# Patient Record
Sex: Female | Born: 1975
Health system: Southern US, Community
[De-identification: ages and names within clinical notes are randomized; demographics above are authoritative.]

## PROBLEM LIST (undated history)

## (undated) DIAGNOSIS — D649 Anemia, unspecified: Secondary | ICD-10-CM

## (undated) DIAGNOSIS — Z9889 Other specified postprocedural states: Secondary | ICD-10-CM

## (undated) DIAGNOSIS — R7303 Prediabetes: Secondary | ICD-10-CM

## (undated) DIAGNOSIS — R112 Nausea with vomiting, unspecified: Secondary | ICD-10-CM

## (undated) DIAGNOSIS — E282 Polycystic ovarian syndrome: Secondary | ICD-10-CM

## (undated) DIAGNOSIS — E079 Disorder of thyroid, unspecified: Secondary | ICD-10-CM

## (undated) DIAGNOSIS — K219 Gastro-esophageal reflux disease without esophagitis: Secondary | ICD-10-CM

## (undated) DIAGNOSIS — E785 Hyperlipidemia, unspecified: Secondary | ICD-10-CM

## (undated) DIAGNOSIS — T7840XA Allergy, unspecified, initial encounter: Secondary | ICD-10-CM

## (undated) HISTORY — DX: Disorder of thyroid, unspecified: E07.9

## (undated) HISTORY — PX: GASTRIC BANDING PORT REVISION: SHX5246

## (undated) HISTORY — DX: Allergy, unspecified, initial encounter: T78.40XA

## (undated) HISTORY — DX: Anemia, unspecified: D64.9

## (undated) HISTORY — DX: Hyperlipidemia, unspecified: E78.5

## (undated) HISTORY — PX: LAPAROSCOPIC GASTRIC SLEEVE RESECTION: SHX5895

## (undated) HISTORY — DX: Polycystic ovarian syndrome: E28.2

## (undated) HISTORY — DX: Morbid (severe) obesity due to excess calories: E66.01

## (undated) HISTORY — DX: Prediabetes: R73.03

## (undated) HISTORY — PX: GASTRIC RESTRICTION SURGERY: SHX653

---

## 2000-01-09 ENCOUNTER — Other Ambulatory Visit: Admission: RE | Admit: 2000-01-09 | Discharge: 2000-01-09 | Payer: Self-pay | Admitting: *Deleted

## 2000-02-16 ENCOUNTER — Emergency Department (HOSPITAL_COMMUNITY): Admission: EM | Admit: 2000-02-16 | Discharge: 2000-02-16 | Payer: Self-pay | Admitting: Emergency Medicine

## 2000-02-17 ENCOUNTER — Encounter: Admission: RE | Admit: 2000-02-17 | Discharge: 2000-02-17 | Payer: Self-pay | Admitting: Family Medicine

## 2000-02-17 ENCOUNTER — Encounter: Payer: Self-pay | Admitting: Family Medicine

## 2000-11-30 ENCOUNTER — Other Ambulatory Visit: Admission: RE | Admit: 2000-11-30 | Discharge: 2000-11-30 | Payer: Self-pay | Admitting: Obstetrics and Gynecology

## 2000-12-22 ENCOUNTER — Encounter: Payer: Self-pay | Admitting: Obstetrics and Gynecology

## 2000-12-22 ENCOUNTER — Ambulatory Visit (HOSPITAL_COMMUNITY): Admission: RE | Admit: 2000-12-22 | Discharge: 2000-12-22 | Payer: Self-pay | Admitting: Obstetrics and Gynecology

## 2001-01-19 ENCOUNTER — Ambulatory Visit (HOSPITAL_COMMUNITY): Admission: RE | Admit: 2001-01-19 | Discharge: 2001-01-19 | Payer: Self-pay | Admitting: Obstetrics and Gynecology

## 2001-01-19 ENCOUNTER — Encounter: Payer: Self-pay | Admitting: Obstetrics and Gynecology

## 2001-03-16 ENCOUNTER — Encounter: Admission: RE | Admit: 2001-03-16 | Discharge: 2001-03-16 | Payer: Self-pay | Admitting: Obstetrics and Gynecology

## 2001-03-19 ENCOUNTER — Inpatient Hospital Stay (HOSPITAL_COMMUNITY): Admission: AD | Admit: 2001-03-19 | Discharge: 2001-03-19 | Payer: Self-pay | Admitting: Obstetrics and Gynecology

## 2001-04-13 ENCOUNTER — Encounter (HOSPITAL_COMMUNITY): Admission: RE | Admit: 2001-04-13 | Discharge: 2001-04-20 | Payer: Self-pay | Admitting: Obstetrics and Gynecology

## 2001-04-16 ENCOUNTER — Encounter: Payer: Self-pay | Admitting: Obstetrics and Gynecology

## 2001-04-19 ENCOUNTER — Inpatient Hospital Stay (HOSPITAL_COMMUNITY): Admission: AD | Admit: 2001-04-19 | Discharge: 2001-04-19 | Payer: Self-pay | Admitting: Obstetrics and Gynecology

## 2001-04-23 ENCOUNTER — Inpatient Hospital Stay (HOSPITAL_COMMUNITY): Admission: AD | Admit: 2001-04-23 | Discharge: 2001-04-23 | Payer: Self-pay | Admitting: Obstetrics and Gynecology

## 2001-04-27 ENCOUNTER — Encounter: Payer: Self-pay | Admitting: Obstetrics and Gynecology

## 2001-04-27 ENCOUNTER — Encounter (HOSPITAL_COMMUNITY): Admission: RE | Admit: 2001-04-27 | Discharge: 2001-05-10 | Payer: Self-pay | Admitting: Obstetrics and Gynecology

## 2001-05-07 ENCOUNTER — Encounter (INDEPENDENT_AMBULATORY_CARE_PROVIDER_SITE_OTHER): Payer: Self-pay

## 2001-05-07 ENCOUNTER — Inpatient Hospital Stay (HOSPITAL_COMMUNITY): Admission: AD | Admit: 2001-05-07 | Discharge: 2001-05-09 | Payer: Self-pay | Admitting: Obstetrics and Gynecology

## 2001-05-10 ENCOUNTER — Encounter: Admission: RE | Admit: 2001-05-10 | Discharge: 2001-06-09 | Payer: Self-pay | Admitting: Obstetrics and Gynecology

## 2001-06-10 ENCOUNTER — Encounter: Admission: RE | Admit: 2001-06-10 | Discharge: 2001-07-10 | Payer: Self-pay | Admitting: Obstetrics and Gynecology

## 2002-04-04 ENCOUNTER — Encounter: Admission: RE | Admit: 2002-04-04 | Discharge: 2002-07-03 | Payer: Self-pay | Admitting: Family Medicine

## 2003-03-08 ENCOUNTER — Emergency Department (HOSPITAL_COMMUNITY): Admission: EM | Admit: 2003-03-08 | Discharge: 2003-03-08 | Payer: Self-pay | Admitting: Emergency Medicine

## 2003-05-19 ENCOUNTER — Encounter: Admission: RE | Admit: 2003-05-19 | Discharge: 2003-05-19 | Payer: Self-pay | Admitting: Surgery

## 2003-05-23 ENCOUNTER — Encounter: Admission: RE | Admit: 2003-05-23 | Discharge: 2003-08-21 | Payer: Self-pay | Admitting: Surgery

## 2003-06-13 ENCOUNTER — Observation Stay (HOSPITAL_COMMUNITY): Admission: RE | Admit: 2003-06-13 | Discharge: 2003-06-14 | Payer: Self-pay | Admitting: Surgery

## 2003-08-23 ENCOUNTER — Encounter: Admission: RE | Admit: 2003-08-23 | Discharge: 2003-08-23 | Payer: Self-pay | Admitting: Surgery

## 2003-08-31 ENCOUNTER — Encounter: Admission: RE | Admit: 2003-08-31 | Discharge: 2003-08-31 | Payer: Self-pay | Admitting: Surgery

## 2003-10-13 ENCOUNTER — Ambulatory Visit (HOSPITAL_COMMUNITY): Admission: RE | Admit: 2003-10-13 | Discharge: 2003-10-13 | Payer: Self-pay | Admitting: Surgery

## 2003-11-06 ENCOUNTER — Ambulatory Visit (HOSPITAL_COMMUNITY): Admission: RE | Admit: 2003-11-06 | Discharge: 2003-11-06 | Payer: Self-pay | Admitting: Surgery

## 2004-03-12 ENCOUNTER — Encounter: Admission: RE | Admit: 2004-03-12 | Discharge: 2004-03-12 | Payer: Self-pay | Admitting: Surgery

## 2004-08-06 ENCOUNTER — Ambulatory Visit: Payer: Self-pay | Admitting: Family Medicine

## 2004-08-20 ENCOUNTER — Other Ambulatory Visit: Admission: RE | Admit: 2004-08-20 | Discharge: 2004-08-20 | Payer: Self-pay | Admitting: Family Medicine

## 2004-08-20 ENCOUNTER — Ambulatory Visit: Payer: Self-pay | Admitting: Family Medicine

## 2005-01-09 ENCOUNTER — Ambulatory Visit: Payer: Self-pay | Admitting: Family Medicine

## 2005-11-10 ENCOUNTER — Ambulatory Visit: Payer: Self-pay | Admitting: Family Medicine

## 2005-11-14 ENCOUNTER — Ambulatory Visit: Payer: Self-pay | Admitting: Family Medicine

## 2005-11-26 ENCOUNTER — Ambulatory Visit: Payer: Self-pay | Admitting: Family Medicine

## 2006-04-19 ENCOUNTER — Emergency Department (HOSPITAL_COMMUNITY): Admission: EM | Admit: 2006-04-19 | Discharge: 2006-04-19 | Payer: Self-pay | Admitting: Family Medicine

## 2006-07-20 ENCOUNTER — Ambulatory Visit: Payer: Self-pay | Admitting: Family Medicine

## 2006-12-02 DIAGNOSIS — E039 Hypothyroidism, unspecified: Secondary | ICD-10-CM | POA: Insufficient documentation

## 2006-12-02 DIAGNOSIS — I10 Essential (primary) hypertension: Secondary | ICD-10-CM | POA: Insufficient documentation

## 2007-02-17 ENCOUNTER — Telehealth: Payer: Self-pay | Admitting: Family Medicine

## 2007-06-30 ENCOUNTER — Telehealth: Payer: Self-pay | Admitting: Family Medicine

## 2007-10-06 ENCOUNTER — Encounter: Admission: RE | Admit: 2007-10-06 | Discharge: 2007-10-06 | Payer: Self-pay | Admitting: Surgery

## 2007-10-19 ENCOUNTER — Encounter: Admission: RE | Admit: 2007-10-19 | Discharge: 2007-10-19 | Payer: Self-pay | Admitting: Obstetrics and Gynecology

## 2007-11-19 ENCOUNTER — Inpatient Hospital Stay (HOSPITAL_COMMUNITY): Admission: AD | Admit: 2007-11-19 | Discharge: 2007-11-19 | Payer: Self-pay | Admitting: Obstetrics and Gynecology

## 2007-12-22 ENCOUNTER — Inpatient Hospital Stay (HOSPITAL_COMMUNITY): Admission: AD | Admit: 2007-12-22 | Discharge: 2007-12-24 | Payer: Self-pay | Admitting: Obstetrics and Gynecology

## 2008-01-26 ENCOUNTER — Ambulatory Visit: Payer: Self-pay | Admitting: Family Medicine

## 2008-02-17 ENCOUNTER — Telehealth: Payer: Self-pay | Admitting: Family Medicine

## 2008-09-15 ENCOUNTER — Telehealth: Payer: Self-pay | Admitting: Family Medicine

## 2009-11-08 ENCOUNTER — Telehealth: Payer: Self-pay | Admitting: Family Medicine

## 2009-11-09 ENCOUNTER — Ambulatory Visit: Payer: Self-pay | Admitting: Family Medicine

## 2009-11-09 DIAGNOSIS — IMO0002 Reserved for concepts with insufficient information to code with codable children: Secondary | ICD-10-CM | POA: Insufficient documentation

## 2009-12-04 ENCOUNTER — Encounter (INDEPENDENT_AMBULATORY_CARE_PROVIDER_SITE_OTHER): Payer: Self-pay | Admitting: *Deleted

## 2009-12-12 ENCOUNTER — Encounter (INDEPENDENT_AMBULATORY_CARE_PROVIDER_SITE_OTHER): Payer: Self-pay | Admitting: *Deleted

## 2009-12-18 ENCOUNTER — Ambulatory Visit: Payer: Self-pay | Admitting: Internal Medicine

## 2009-12-26 ENCOUNTER — Ambulatory Visit: Payer: Self-pay | Admitting: Family Medicine

## 2009-12-26 LAB — CONVERTED CEMR LAB
Nitrite: NEGATIVE
Protein, U semiquant: NEGATIVE

## 2009-12-28 LAB — CONVERTED CEMR LAB
AST: 15 units/L (ref 0–37)
Albumin: 4.1 g/dL (ref 3.5–5.2)
Alkaline Phosphatase: 58 units/L (ref 39–117)
Basophils Absolute: 0.1 10*3/uL (ref 0.0–0.1)
Basophils Relative: 1 % (ref 0–1)
Calcium: 9.5 mg/dL (ref 8.4–10.5)
Creatinine, Ser: 0.79 mg/dL (ref 0.40–1.20)
Eosinophils Relative: 3 % (ref 0–5)
HCT: 37.2 % (ref 36.0–46.0)
HDL: 39 mg/dL — ABNORMAL LOW (ref >39)
Hemoglobin: 11.7 g/dL — ABNORMAL LOW (ref 12.0–15.0)
Indirect Bilirubin: 0.5 mg/dL (ref 0.0–0.9)
MCHC: 31.5 g/dL (ref 30.0–36.0)
MCV: 80.9 fL (ref 78.0–100.0)
Monocytes Absolute: 0.6 10*3/uL (ref 0.1–1.0)
RDW: 14.4 % (ref 11.5–15.5)
Total Bilirubin: 0.6 mg/dL (ref 0.3–1.2)
Total Protein: 7.1 g/dL (ref 6.0–8.3)
Triglycerides: 47 mg/dL (ref <150)

## 2010-01-01 ENCOUNTER — Ambulatory Visit: Payer: Self-pay | Admitting: Internal Medicine

## 2010-01-01 HISTORY — PX: COLONOSCOPY: SHX174

## 2010-01-02 ENCOUNTER — Ambulatory Visit: Payer: Self-pay | Admitting: Family Medicine

## 2010-01-08 ENCOUNTER — Encounter: Admission: RE | Admit: 2010-01-08 | Discharge: 2010-01-08 | Payer: Self-pay | Admitting: Family Medicine

## 2010-03-09 ENCOUNTER — Emergency Department (HOSPITAL_COMMUNITY): Admission: EM | Admit: 2010-03-09 | Discharge: 2010-03-09 | Payer: Self-pay | Admitting: Emergency Medicine

## 2010-04-23 ENCOUNTER — Telehealth: Payer: Self-pay | Admitting: Family Medicine

## 2010-04-30 ENCOUNTER — Ambulatory Visit
Admission: RE | Admit: 2010-04-30 | Discharge: 2010-04-30 | Payer: Self-pay | Source: Home / Self Care | Attending: Family Medicine | Admitting: Family Medicine

## 2010-04-30 DIAGNOSIS — M545 Low back pain, unspecified: Secondary | ICD-10-CM | POA: Insufficient documentation

## 2010-05-11 ENCOUNTER — Encounter: Payer: Self-pay | Admitting: Surgery

## 2010-05-21 NOTE — Letter (Signed)
Summary: [facility] Instructions  [facility]  [address] [place], [place] [address]   Phone: [phone]  Fax: [phone]       Lisa Horne    Sep 14, 1975    MRN: [id number]        Procedure Day Lisa Horne:  Lisa Horne  01/01/10     Arrival Time:  1:00PM     Procedure Time:  2:00PM     Location of Procedure:                    _ X_  [facility] (4th Floor)                      PREPARATION FOR COLONOSCOPY WITH MOVIPREP   Starting 5 days prior to your procedure 12/27/09 do not eat nuts, seeds, popcorn, corn, beans, peas,  salads, or any raw vegetables.  Do not take any fiber supplements (e.g. Metamucil, Citrucel, and Benefiber).  THE DAY BEFORE YOUR PROCEDURE         DATE: 12/31/09   DAY: MONDAY  1.  Drink clear liquids the entire day-NO SOLID FOOD  2.  Do not drink anything colored red or purple.  Avoid juices with pulp.  No orange juice.  3.  Drink at least 64 oz. (8 glasses) of fluid/clear liquids during the day to prevent dehydration and help the prep work efficiently.  CLEAR LIQUIDS INCLUDE: Water Jello Ice Popsicles Tea (sugar ok, no milk/cream) Powdered fruit flavored drinks Coffee (sugar ok, no milk/cream) Gatorade Juice: apple, white grape, white cranberry  Lemonade Clear bullion, consomm, broth Carbonated beverages (any kind) Strained chicken noodle soup Hard Candy                             4.  In the morning, mix first dose of MoviPrep solution:    Empty 1 Pouch A and 1 Pouch B into the disposable container    Add lukewarm drinking water to the top line of the container. Mix to dissolve    Refrigerate (mixed solution should be used within 24 hrs)  5.  Begin drinking the prep at 5:00 p.m. The MoviPrep container is divided by 4 marks.   Every 15 minutes drink the solution down to the next mark (approximately 8 oz) until the full liter is complete.   6.  Follow completed prep with 16 oz of clear liquid of your choice (Nothing  red or purple).  Continue to drink clear liquids until bedtime.  7.  Before going to bed, mix second dose of MoviPrep solution:    Empty 1 Pouch A and 1 Pouch B into the disposable container    Add lukewarm drinking water to the top line of the container. Mix to dissolve    Refrigerate  THE DAY OF YOUR PROCEDURE      DATE: 01/01/10  DAY: TUESDAY  Beginning at 9:00AM (5 hours before procedure):         1. Every 15 minutes, drink the solution down to the next mark (approx 8 oz) until the full liter is complete.  2. Follow completed prep with 16 oz. of clear liquid of your choice.    3. You may drink clear liquids until 12:00PM (2 HOURS BEFORE PROCEDURE).   MEDICATION INSTRUCTIONS  Unless otherwise instructed, you should take regular prescription medications with a small sip of water   as early as possible the morning of  your procedure.        OTHER INSTRUCTIONS  You will need a responsible adult at least 37 years of age to accompany you and drive you home.   This person must remain in the waiting room during your procedure.  Wear loose fitting clothing that is easily removed.  Leave jewelry and other valuables at home.  However, you may wish to bring a book to read or  an iPod/MP3 player to listen to music as you wait for your procedure to start.  Remove all body piercing jewelry and leave at home.  Total time from sign-in until discharge is approximately 2-3 hours.  You should go home directly after your procedure and rest.  You can resume normal activities the  day after your procedure.  The day of your procedure you should not:   Drive   Make legal decisions   Operate machinery   Drink alcohol   Return to work  You will receive specific instructions about eating, activities and medications before you leave.    The above instructions have been reviewed and explained to me by   Wyona Almas RN  December 18, 2009 1:43 PM     I fully understand and can  verbalize these instructions _____________________________ Date _________

## 2010-05-21 NOTE — Assessment & Plan Note (Signed)
Summary: sharp shooting pain in upper thigh rt leg/cjr   Vital Signs:  Patient profile:   35 year old female Weight:      242 pounds BP sitting:   108 / 76  (left arm) Cuff size:   regular  Vitals Entered By: Raechel Ache, RN (November 09, 2009 1:53 PM) CC: C/o pain front of R thigh x 2 weeks after fall.   History of Present Illness: About 2 weeks ago while [place] she slipped on some rocks and lost her balance. She was able to catch herself before she fell, but she has had pain in the right hip ever since. It hurts in the anterior right groin and the anterior upper thigh when she walks on it or tries to sit coss legged. No pain in the back. Heat and ice do not help. She has not taken any meds for it.   Allergies: 1)  ! Cipro 2)  ! Dyazide  Past History:  Past Medical History: Reviewed history from 12/02/2006 and no changes required. Obesity Hemorrhoids Hyperglycemia Hypertension Hypothyroidism  Review of Systems  The patient denies anorexia, fever, weight loss, weight gain, vision loss, decreased hearing, hoarseness, chest pain, syncope, dyspnea on exertion, peripheral edema, prolonged cough, headaches, hemoptysis, abdominal pain, melena, hematochezia, severe indigestion/heartburn, hematuria, incontinence, genital sores, muscle weakness, suspicious skin lesions, transient blindness, difficulty walking, depression, unusual weight change, abnormal bleeding, enlarged lymph nodes, angioedema, breast masses, and testicular masses.    Physical Exam  General:  walks with a slight limp Msk:  No deformity or scoliosis noted of thoracic or lumbar spine.  She is not tender anywhere around the hip or groin or thigh, but extreme flexion or internal/external rotation are very painful for her    Impression & Recommendations:  Problem # 1:  SPRAIN&STRAIN OF UNSPECIFIED SITE OF HIP&THIGH (ICD-843.9)  Orders: T-Hip Comp Right Min 2 views (73510TC)  Patient  Instructions: 1)  Rest, will try Motrin for pain. Set up hip Xrays soon

## 2010-05-21 NOTE — Letter (Signed)
Summary: Pre Visit Letter Revised  [facility]  [address] [place], [place] [address]   Phone: [phone]  Fax: [phone]    12/04/2009 MRN: [id number]  Lisa Horne [address] [place], [place]  [address]              Procedure Date:  01-01-10    Welcome to the Gastroenterology Division at [facility].    You are scheduled to see a nurse for your pre-procedure visit on 12-18-09 at 1:30p.m. on the 3rd floor at [facility], [address]  We ask that you try to arrive at our office 15 minutes prior to your appointment time to allow for check-in.  Please take a minute to review the attached form.  If you answer "Yes" to one or more of the questions on the first page, we ask that you call the person listed at your earliest opportunity.  If you answer "No" to all of the questions, please complete the rest of the form and bring it to your appointment.    Your nurse visit will consist of discussing your medical and surgical history, your immediate family medical history, and your medications.    If you are unable to list all of your medications on the form, please bring the medication bottles to your appointment and we will list them.  We will need to be aware of both prescribed and over the counter drugs.  We will need to know exact dosage information as well.    Please be prepared to read and sign documents such as consent forms, a financial agreement, and acknowledgement forms.  If necessary, and with your consent, a friend or relative is welcome to sit-in on the nurse visit with you.  Please bring your insurance card so that we may make a copy of it.  If your insurance requires a referral to see a specialist, please bring your referral form from your primary care physician.  No co-pay is required for this nurse visit.     If you cannot keep your appointment, please call [phone] to cancel or reschedule prior to your appointment date.  This  allows [place] the opportunity to schedule an appointment for another patient in need of care.   Thank you for choosing [facility] for your medical needs.  We appreciate the opportunity to care for you.  Please visit [place] at our website  to learn more about our practice.                     Sincerely,  The Gastroenterology Division

## 2010-05-21 NOTE — Procedures (Signed)
Summary: Colonoscopy  Patient: Lisa Horne Note: All result statuses are Final unless otherwise noted.  Tests: (1) Colonoscopy (COL)   COL Colonoscopy           DONE     [facility]     [address]     [place], [place]  [address]           COLONOSCOPY PROCEDURE REPORT           PATIENT:  Lisa, Horne  MR#:  [id number]     BIRTHDATE:  1976-03-10, 35 yrs. old  GENDER:  female     ENDOSCOPIST:  Iva Boop, MD, [facility]           PROCEDURE DATE:  01/01/2010     PROCEDURE:  Colonoscopy [address]     ASA CLASS:  Class I     INDICATIONS:  Elevated Risk Screening, family history of colon     cancer sister at age 28 and  a maternal aunt     MEDICATIONS:   Fentanyl 50 mcg IV, Versed 7 mg IV           DESCRIPTION OF PROCEDURE:   After the risks benefits and     alternatives of the procedure were thoroughly explained, informed     consent was obtained.  Digital rectal exam was performed and     revealed no abnormalities.   The [id number] endoscope was     introduced through the anus and advanced to the cecum, which was     identified by both the appendix and ileocecal valve, without     limitations.  The quality of the prep was excellent, using     MoviPrep.  The instrument was then slowly withdrawn as the colon     was fully examined. Insertion: 2:29 minutes Withdrawal: 6:31     minutes     <<PROCEDUREIMAGES>>           FINDINGS:  A normal appearing cecum, ileocecal valve, and     appendiceal orifice were identified. The ascending, hepatic     flexure, transverse, splenic flexure, descending, sigmoid colon,     and rectum appeared unremarkable.   Retroflexed views in the     rectum revealed internal and external hemorrhoids.    The scope     was then withdrawn from the patient and the procedure completed.           COMPLICATIONS:  None     ENDOSCOPIC IMPRESSION:     1) Normal colon, excellent prep     2) Internal and external hemorrhoids           REPEAT EXAM:   In 5 year(s) for routine screening colonoscopy.           Iva Boop, MD, [facility]           CC:  Nelwyn Salisbury, MD     The Patient           n.     Rosalie Doctor:   Iva Boop at 01/01/2010 02:27 PM           Lisa Horne, [id number]  Note: An exclamation mark (!) indicates a result that was not dispersed into the flowsheet. Document Creation Date: 01/01/2010 2:28 PM _______________________________________________________________________  (1) Order result status: Final Collection or observation date-time: 01/01/2010 14:16 Requested date-time:  Receipt date-time:  Reported date-time:  Referring Physician:   Ordering Physician: Stan Head [phone]) Specimen Source:  Source:  Launa Grill Order Number: [id number] Lab site:   Appended Document: Colonoscopy    Clinical Lists Changes  Observations: Added new observation of COLONNXTDUE: 12/2014 (01/01/2010 14:53)

## 2010-05-21 NOTE — Miscellaneous (Signed)
Summary: [facility] Previsit/prep  Clinical Lists Changes  Medications: Added new medication of MOVIPREP 100 GM  SOLR (PEG-KCL-NACL-NASULF-NA ASC-C) As per prep instructions. - Signed Rx of MOVIPREP 100 GM  SOLR (PEG-KCL-NACL-NASULF-NA ASC-C) As per prep instructions.;  #1 x 0;  Signed;  Entered by: Wyona Almas RN;  Authorized by: Iva Boop MD, [facility];  Method used: Electronically to [facility]*, [address], [place], [place]  [id number], Ph: [phone], Fax: [phone] Allergies: Changed allergy or adverse reaction from CIPRO to CIPRO Changed allergy or adverse reaction from DYAZIDE to [facility]    Prescriptions: MOVIPREP 100 GM  SOLR (PEG-KCL-NACL-NASULF-NA ASC-C) As per prep instructions.  #1 x 0   Entered by:   Wyona Almas RN   Authorized by:   Iva Boop MD, [facility]   Signed by:   Wyona Almas RN on 12/18/2009   Method used:   Electronically to        [facility]* (retail)       [address]       [place], [place]  [id number]       Ph: [phone]       Fax: [phone]   RxID:   [id number]

## 2010-05-21 NOTE — Assessment & Plan Note (Signed)
Summary: CPX/NO PAP/NJR   Vital Signs:  Patient profile:   35 year old female Menstrual status:  regular LMP:     11/19/2009 Height:      68 inches Weight:      238 pounds BMI:     36.32 O2 Sat:      98 % Temp:     98.5 degrees F Pulse rate:   94 / minute BP sitting:   120 / 82  (left arm) Cuff size:   regular  Vitals Entered By: Pura Spice, RN (January 02, 2010 10:01 AM) CC: cpx with pap  discuss labs had colonscopy yest.  Is Patient Diabetic? No LMP (date): 11/19/2009     Menstrual Status regular Enter LMP: 11/19/2009    History of Present Illness: 35 yr old female for a cpx. She feels fine in general. Still running and training for a half marathon she plans to participate in next month.   Allergies: 1)  ! Cipro 2)  ! Dyazide  Past History:  Past Medical History: Obesity Hemorrhoids Hyperglycemia Hypertension Hypothyroidism sees Dr. Jarold Song for GYN exams  Past Surgical History: Gastric banding 2005 colonoscopy 01-01-10 per Dr. Leone Payor, internal and external hemorrhoids only, repeat in 5 yrs  Family History: Reviewed history and no changes required. unremarkable  Social History: Reviewed history from 12/02/2006 and no changes required. Married Never Smoked Regular exercise-no Alcohol use-no  Review of Systems  The patient denies anorexia, fever, weight loss, weight gain, vision loss, decreased hearing, hoarseness, chest pain, syncope, dyspnea on exertion, peripheral edema, prolonged cough, headaches, hemoptysis, abdominal pain, melena, hematochezia, severe indigestion/heartburn, hematuria, incontinence, genital sores, muscle weakness, suspicious skin lesions, transient blindness, difficulty walking, depression, unusual weight change, abnormal bleeding, enlarged lymph nodes, angioedema, breast masses, and testicular masses.         Flu Vaccine Consent Questions     Do you have a history of severe allergic reactions to this vaccine? no    Any  prior history of allergic reactions to egg and/or gelatin? no    Do you have a sensitivity to the preservative Thimersol? no    Do you have a past history of Guillan-Barre Syndrome? no    Do you currently have an acute febrile illness? no    Have you ever had a severe reaction to latex? no    Vaccine information given and explained to patient? yes    Are you currently pregnant? no    Lot Number:[id number]   Exp Date:10/19/2010   Manufacturer: [employer]    Site Given  Left Deltoid IM Pura Spice, RN  January 02, 2010 11:26 AM   Physical Exam  General:  overweight-appearing.   Head:  Normocephalic and atraumatic without obvious abnormalities. No apparent alopecia or balding. Eyes:  No corneal or conjunctival inflammation noted. EOMI. Perrla. Funduscopic exam benign, without hemorrhages, exudates or papilledema. Vision grossly normal. Ears:  External ear exam shows no significant lesions or deformities.  Otoscopic examination reveals clear canals, tympanic membranes are intact bilaterally without bulging, retraction, inflammation or discharge. Hearing is grossly normal bilaterally. Nose:  External nasal examination shows no deformity or inflammation. Nasal mucosa are pink and moist without lesions or exudates. Mouth:  Oral mucosa and oropharynx without lesions or exudates.  Teeth in good repair. Neck:  No deformities, masses, or tenderness noted. Chest Wall:  No deformities, masses, or tenderness noted. Lungs:  Normal respiratory effort, chest expands symmetrically. Lungs are clear to auscultation, no crackles or wheezes. Heart:  Normal  rate and regular rhythm. S1 and S2 normal without gallop, murmur, click, rub or other extra sounds. Abdomen:  Bowel sounds positive,abdomen soft and non-tender without masses, organomegaly or hernias noted. Msk:  No deformity or scoliosis noted of thoracic or lumbar spine.   Pulses:  R and L carotid,radial,femoral,dorsalis pedis and posterior tibial pulses  are full and equal bilaterally Extremities:  No clubbing, cyanosis, edema, or deformity noted with normal full range of motion of all joints.   Neurologic:  No cranial nerve deficits noted. Station and gait are normal. Plantar reflexes are down-going bilaterally. DTRs are symmetrical throughout. Sensory, motor and coordinative functions appear intact. Skin:  Intact without suspicious lesions or rashes Cervical Nodes:  No lymphadenopathy noted Axillary Nodes:  No palpable lymphadenopathy Inguinal Nodes:  No significant adenopathy Psych:  Cognition and judgment appear intact. Alert and cooperative with normal attention span and concentration. No apparent delusions, illusions, hallucinations   Impression & Recommendations:  Problem # 1:  HEALTH MAINTENANCE EXAM (ICD-V70.0)  Complete Medication List: 1)  Merina  2)  Synthroid 50 Mcg Tabs (Levothyroxine sodium) .... Once daily  Other Orders: Flu Vaccine 74yrs + MEDICARE PATIENTS (Q2595) Administration Flu vaccine - MCR (G3875)  Patient Instructions: 1)  Start on Synthroid and recheck a TSH in 90 days Prescriptions: SYNTHROID 50 MCG TABS (LEVOTHYROXINE SODIUM) once daily  #30 x 2   Entered and Authorized by:   Nelwyn Salisbury MD   Signed by:   Nelwyn Salisbury MD on 01/02/2010   Method used:   Electronically to        [facility]* (retail)       [address]       [place], [place]  [id number]       Ph: [phone]       Fax: [phone]   RxID:   [id number]

## 2010-05-21 NOTE — Progress Notes (Signed)
Summary: hip pain  Phone Note Call from Patient Call back at [phone]   Caller: Patient Summary of Call: patient is calling because she is having pain with her right hip.  she fell and hit her hip on river rocks.  she is now having sharp pain with movement.  should she come in or a referral needed? Initial call taken by: Kern Reap CMA Duncan Dull),  November 08, 2009 4:10 PM  Follow-up for Phone Call        I need to see her for this Follow-up by: Nelwyn Salisbury MD,  November 09, 2009 10:35 AM  Additional Follow-up for Phone Call Additional follow up Details #1::        appt today

## 2010-05-23 NOTE — Progress Notes (Signed)
Summary: has strep    [facility] 04/23/2010  Phone Note Call from Patient Call back at [phone]   Caller: Patient---live call Summary of Call: May have strep throat. fever---102, sore throat. Initial call taken by: Warnell Forester,  April 23, 2010 8:46 AM  Follow-up for Phone Call        [facility] Follow-up by: [facility] CMA AAMA,  April 23, 2010 1:35 PM  Additional Follow-up for Phone Call Additional follow up Details #1::        Feeling better, but will call back if needs appt. Additional Follow-up by: Lynann Beaver CMA AAMA,  April 24, 2010 1:47 PM

## 2010-05-23 NOTE — Assessment & Plan Note (Signed)
Summary: back injury/dm   Vital Signs:  Patient profile:   35 year old female Menstrual status:  regular O2 Sat:      94 % Temp:     99.4 degrees F Pulse rate:   84 / minute BP sitting:   140 / 80  (left arm) Cuff size:   regular  Vitals Entered By: Pura Spice, RN (April 30, 2010 3:49 PM) CC: lower back pain rt leg painful  ran out of synthroid.   History of Present Illness: Here for Synthroid refills and for back pain. She took Synthroid for 3 months and then ran out, and this was one month ago. Yesterday while unloading groceries she felt a sharp low back pain with some spasms. Advil helps a bit.   Allergies: 1)  ! Cipro 2)  ! Dyazide  Past History:  Past Medical History: Reviewed history from 01/02/2010 and no changes required. Obesity Hemorrhoids Hyperglycemia Hypertension Hypothyroidism sees Dr. Jarold Song for GYN exams  Past Surgical History: Reviewed history from 01/02/2010 and no changes required. Gastric banding 2005 colonoscopy 01-01-10 per Dr. Leone Payor, internal and external hemorrhoids only, repeat in 5 yrs  Review of Systems  The patient denies anorexia, fever, weight loss, weight gain, vision loss, decreased hearing, hoarseness, chest pain, syncope, dyspnea on exertion, peripheral edema, prolonged cough, headaches, hemoptysis, abdominal pain, melena, hematochezia, severe indigestion/heartburn, hematuria, incontinence, genital sores, muscle weakness, suspicious skin lesions, transient blindness, difficulty walking, depression, unusual weight change, abnormal bleeding, enlarged lymph nodes, angioedema, breast masses, and testicular masses.    Physical Exam  General:  Well-developed,well-nourished,in no acute distress; alert,appropriate and cooperative throughout examination Msk:  tende rin the lower back with spasms. ROM is reduced but SLR are negative   Impression & Recommendations:  Problem # 1:  HYPOTHYROIDISM (ICD-244.9)  The following  medications were removed from the medication list:    Synthroid 50 Mcg Tabs (Levothyroxine sodium) ..... Once daily Her updated medication list for this problem includes:    Synthroid 100 Mcg Tabs (Levothyroxine sodium) ..... Once daily  Problem # 2:  BACK PAIN, LUMBAR (ICD-724.2)  Her updated medication list for this problem includes:    Vicodin 5-500 Mg Tabs (Hydrocodone-acetaminophen) .Marland Kitchen... 1 q 6 hours as needed pain    Flexeril 10 Mg Tabs (Cyclobenzaprine hcl) .Marland Kitchen... Three times a day as needed spasm  Complete Medication List: 1)  Merina  2)  Vicodin 5-500 Mg Tabs (Hydrocodone-acetaminophen) .Marland Kitchen.. 1 q 6 hours as needed pain 3)  Flexeril 10 Mg Tabs (Cyclobenzaprine hcl) .... Three times a day as needed spasm 4)  Synthroid 100 Mcg Tabs (Levothyroxine sodium) .... Once daily  Patient Instructions: 1)  Please schedule a follow-up appointment in 3 months .  Prescriptions: SYNTHROID 100 MCG TABS (LEVOTHYROXINE SODIUM) once daily  #90 x 0   Entered and Authorized by:   Nelwyn Salisbury MD   Signed by:   Nelwyn Salisbury MD on 04/30/2010   Method used:   Print then Give to Patient   RxID:   [id number] FLEXERIL 10 MG TABS (CYCLOBENZAPRINE HCL) three times a day as needed spasm  #60 x 5   Entered and Authorized by:   Nelwyn Salisbury MD   Signed by:   Nelwyn Salisbury MD on 04/30/2010   Method used:   Print then Give to Patient   RxID:   [id number] VICODIN 5-500 MG TABS (HYDROCODONE-ACETAMINOPHEN) 1 q 6 hours as needed pain  #60 x 0   Entered and Authorized  by:   Nelwyn Salisbury MD   Signed by:   Nelwyn Salisbury MD on 04/30/2010   Method used:   Print then Give to Patient   RxID:   [id number]    Orders Added: 1)  Est. Patient Level IV [[address]]

## 2010-08-01 ENCOUNTER — Encounter: Payer: BC Managed Care – HMO | Attending: Surgery | Admitting: *Deleted

## 2010-08-01 DIAGNOSIS — Z9884 Bariatric surgery status: Secondary | ICD-10-CM | POA: Insufficient documentation

## 2010-08-01 DIAGNOSIS — Z713 Dietary counseling and surveillance: Secondary | ICD-10-CM | POA: Insufficient documentation

## 2010-08-01 DIAGNOSIS — Z09 Encounter for follow-up examination after completed treatment for conditions other than malignant neoplasm: Secondary | ICD-10-CM | POA: Insufficient documentation

## 2010-09-03 NOTE — Discharge Summary (Signed)
[name], Lisa Horne             ACCOUNT NO.:  [id number]   MEDICAL RECORD NO.:  [id number]          PATIENT TYPE:  INP   LOCATION:  9135                          FACILITY:  [facility]   PHYSICIAN:  Huel Cote, M.D. DATE OF BIRTH:  10/26/1975   DATE OF ADMISSION:  12/22/2007  DATE OF DISCHARGE:  12/24/2007                               DISCHARGE SUMMARY   DISCHARGE DIAGNOSES:  1. Term pregnancy at 40-0/7 weeks, delivered.  2. Precipitous vaginal delivery.   DISCHARGE MEDICATIONS:  1. Motrin 600 mg p.o. every 6 hours.  2. Proctocort suppositories per rectum once daily for 3-4 days.   DISCHARGE FOLLOWUP:  The patient is to follow up in my office in 6 weeks  for a full postpartum exam.   HOSPITAL COURSE:  The patient is a 35 year old G3, P1-0-0-1, who was  admitted at 67 weeks' gestation after presenting to Maternity Admissions  with a cervix noted to be 3 cm dilated at 2:30 a.m.  She was then  admitted to Labor and Delivery at approximately 4 a.m. and received an  epidural.  She was checked shortly thereafter, noted to be 7 cm, and  then had rupture of membranes.  I was paged at 4:53 a.m. to deliver the  patient, and the patient delivered at 4:55 a.m.   Prenatal labs are as follows:  A positive, antibody negative, RPR  nonreactive, rubella immune, hepatitis B surface antigen negative.  The  patient did have a positive ELISA on her HIV screen, however had a PCR  which was negative for viral load.  GC was negative, chlamydia negative,  1-hour Glucola was 140, and 3-hour was normal.  Group B strep was  positive.  She declined a quad screen.   PAST OBSTETRICAL HISTORY:  Significant for a vaginal delivery in 2003 of  a 6-pound 12-ounce infant that had a birth defect of bladder exstrophy.  In 2004, she had a spontaneous abortion, prenatal care being complicated  by the gestational diabetes with good control on glyburide and history  of hypertension with borderline blood pressures at  the last few visits.  The PIH workup is negative.  She was also positive group B strep.   PAST MEDICAL HISTORY:  Hypothyroidism and history of a lap band  procedure.   PAST SURGICAL HISTORY:  lap band procedure.   GYNECOLOGICAL HISTORY:  None.   ALLERGIES:  CIPRO and HYDROCHLOROTHIAZIDE.   MEDICATIONS:  Glyburide b.i.d. and prenatal vitamins.   HOSPITAL COURSE:  Blood pressures were 130s/60s.  As stated, shortly  after reaching complete dilation, the patient precipitously delivered by  the RN.  There was a nuchal cord x1, Apgars were 8 and 9, weight was 6  pounds 8 ounces, of a vigorous female infant.  Placenta delivered  spontaneously.  A first-degree laceration was noted by me and was  repaired with 2-0 Vicryl for hemostasis after my arrival.  Cervix and  rectum were intact.  Clot was evacuated  from uterus.  Estimated blood loss 350 mL.  The patient did not receive  her full penicillin prophylaxis due to her rapid course.  By postpartum  day #2, the patient was doing quite well.  Hemoglobin was 8.8.  Fundus  was firm and she was felt stable for discharge home.      Huel Cote, M.D.  Electronically Signed     KR/MEDQ  D:  01/24/2008  T:  01/25/2008  Job:  [id number]

## 2010-09-06 NOTE — Discharge Summary (Signed)
[facility]  Patient:    Lisa Horne, Lisa Horne Visit Number: [id number] MRN: [id number]          Service Type: ANT Location: [facility] Attending Physician:  Malon Kindle Dictated by:   Zenaida Niece, M.D. Admit Date:  04/27/2001 Discharge Date: 05/10/2001                             Discharge Summary  ADMISSION DIAGNOSES: 1. Intrauterine pregnancy at 37 weeks. 2. Chronic hypertension. 3. Gestational diabetes. 4. Hypothyroidism. 5. Obesity.  DISCHARGE DIAGNOSES: 1. Intrauterine pregnancy at 37 weeks. 2. Chronic hypertension. 3. Gestational diabetes. 4. Hypothyroidism. 5. Obesity. 6. Bladder extrophy of the infant.  PROCEDURE: 1. Cervical ripening. 2. Spontaneous vaginal delivery.  COMPLICATIONS:  Bladder extrophy of the baby.  CONSULTATIONS:  NICU and pediatric surgery.  HISTORY OF PRESENT ILLNESS:  This is a 35 year old white female, gravida 1, para 0, with an estimated gestational age of 37+ weeks by an 54 week ultrasound with a due date of May 22, 2001, who presents for ripening and induction due to chronic hypertension and class A2 gestational diabetes.  She has had good fetal movement, no vaginal bleeding or rupture of membranes, and occasional contractions.  Prenatal care was complicated by chronic hypertension, treated with Aldomet throughout the pregnancy with recent blood pressures as high as 160 to 190/90 to 100.  She has had nausea and vomiting treated with Zofran in the first and second trimester.  Upper respiratory infection treated with a Z-Pak at 25 weeks.  Class A2 diabetes which has not been well controlled until the past few weeks on insulin.  Hypothyroidism with normal labs, on Synthroid.  She has measured size greater than dates, and had an ultrasound on April 27, 2001, which revealed an estimated fetal weight of 2900 g, and an AFI of 14.9, and a 24 hour urine collection on April 27, 2001, for her  chronic hypertension revealed 366 mg of protein.  PRENATAL LABORATORY DATA:  Blood type is A+, negative antibody screen.  RPR nonreactive.  Rubella equivocal.  Hepatitis B surface antigen negative.  HIV negative.  Gonorrhea and chlamydia negative.  Triple screen normal.  Group B strep is negative.  PAST MEDICAL HISTORY: 1. Chronic hypertension. 2. Hypothyroidism. 3. Obesity.  PAST SURGICAL HISTORY:  Wisdom teeth removal.  ALLERGIES:  CIPRO, HYDROCHLOROTHIAZIDE.  MEDICATIONS: 1. Aldomet 250 mg t.i.d. 2. Synthroid 100 mcg q.d. 3. Insulin 16 units of NPH and 16 units of regular q.a.m. and 20 of NPH and 11    of regular q.p.m.  SOCIAL HISTORY:  She is married, and denies alcohol, tobacco, or drug use.  FAMILY HISTORY:  Negative.  PHYSICAL EXAMINATION:  VITAL SIGNS:  Blood pressures here in the hospital are 120s to 150s/80s to 90s, and she is afebrile.  Fetal heart rate tracing reactive with irregular to rare contractions.  ABDOMEN:  Obese, gravid, nontender, with a fundal height of 40 cm.  PELVIC:  Vaginal examination is 1, 80, -2, vertex presentation, and an adequate pelvis.  HOSPITAL COURSE:  The patient was admitted and had Cytotec placed for cervical ripening.  She had several doses of Cytotec on the night of May 07, 2001, and on the morning of May 08, 2001, cervix was 1+, 80, and -1 to -2 with a vertex presentation, and amniotomy revealed clear fluid.  Blood pressures remained acceptable, and her CBGs were also acceptable during this time.  She was  put on Pitocin, and eventually entered active labor and received an epidural.  She then was continued on Pitocin, progressed to complete, and pushed well.  On the evening of May 08, 2001, she had a vaginal delivery of a viable female infant with Apgars of 9 and 9, that weighed 6 pounds 12 ounces.  Upon delivery, there was noted to be an obvious bladder extrophy of the baby with some penial involvement as well.  The  NICU team was called to evaluate the baby.  Placenta delivered spontaneous and was intact.  She had a left vaginal and perineal lacerations repaired with 3-0 Vicryl, and estimated blood loss was approximately 500 cc.  Postpartum, the patient did very well with normal blood pressures, and a fasting CBG of 128, and a one hour postprandial breakfast of approximately 140.  The baby was transferred on the evening of May 08, 2001, over to [facility] for evaluation for surgery. Pre-delivery hemoglobin was 11.4, post-delivery was 10.0.  Due to stable sugars and stable blood pressures, and the patients request to be discharged so that she could go to [facility] to be with her baby, the patient was felt to be stable enough for discharge home late on the morning of postpartum day #1, May 09, 2001.  CONDITION ON DISCHARGE:  Stable.  DISPOSITION:  Discharged to home.  DIET:  Low carbohydrate.  ACTIVITY:  Pelvic rest.  FOLLOWUP:  In a few days to check blood pressure and CBGs.  DISCHARGE MEDICATIONS:  She is to continue her pre-delivery dose of Synthroid.  She is to continue to check her CBGs fasting one hour postprandial. Dictated by:   Zenaida Niece, M.D. Attending Physician:  Malon Kindle DD:  05/25/01 TD:  05/26/01 Job: [id number]

## 2010-09-06 NOTE — Op Note (Signed)
NAME:  Lisa Horne, Lisa Horne                       ACCOUNT NO.:  [id number]   MEDICAL RECORD NO.:  [id number]                   PATIENT TYPE:  AMB   LOCATION:  DAY                                  FACILITY:  [facility]   PHYSICIAN:  Thornton Park. Daphine Deutscher, M.D.             DATE OF BIRTH:  02-23-1976   DATE OF PROCEDURE:  11/06/2003  DATE OF DISCHARGE:                                 OPERATIVE REPORT   PREOPERATIVE DIAGNOSIS:  Change out/revision of Lap-Band port, Reference B-  2101,  Serial #[id number].   SURGEON:  Thornton Park. Daphine Deutscher, M.D.   DESCRIPTION OF PROCEDURE:  Lisa Horne was given Ancef preoperatively as  well as heparin and was taken back to Room 11.  Preoperatively, we discussed  options of managing her port site which we had had more increasing  difficulty accessing.  First, I excised her previous transverse incision and  then dissected down through the fat where I could feel the port.  Even under  direct dissection, it was somewhat difficult to find the port, and there was  quite an inflammatory reaction where it had sealed itself off.  This was  consistent with scar tissue.  I then cut away over the dome of the port and  found it to actually be lying in good position and had not significantly  twisted.  It was, however, about 5 cm deep to the skin.   With the long port needle which had come with the kit for percutaneous  access, I connected this to a 5-cc syringe which I had preflushed through  the needle.  I then placed the needle and pushed it into the silicone  membrane.  This was sort of an opalescent gray, and it went in with some  difficulty even under direct vision, and in fact, just inserting it, it  seemed to bend the needle.  I attempted to aspirate and did not really get  any fluid back, and I tried to infuse and was unable to get anything that  way either to go in.  I then withdrew the needle, and just the angulation  from passing this, it seemed to kink off this long  needle, and it would not  either aspirate or press out.  I then had to call for new [facility] needles.  I  then went ahead and decided I needed to get the port completely out and look  at it, and I removed the fascial sutures and brought it up into the wound.  There, under direct vision, I then inserted a needle with some difficulty  and was able to put fluid into the band and withdraw it.  Interestingly, I  noted that when the [facility] needle was in and I filled up the band, there was  some leakage out around the silicone where droplet by droplet this could  come out.  I felt that there was no  obvious trauma to the stem  and to the  catheter, but it seemed the port itself having been very difficult to access  even under direct vision needed to be changed out.  I, therefore, pulled it  out to the point of the stem, and then removed the old port and put the Lap-  Band access port kit which I then flushed with saline to get out any air,  and then I fed it back down over the stem.  Actually, the fresh stem went  into the prior port.  This sealed down nicely and was flushed.  I then  passed four Prolene sutures in through the fascia/the scar tissue at the  posterior wall of the previous port site.  These were then threaded through  the little holes in the port, and these were tied down, and again it seemed  to be nice and flush with the fascia, and the orientation of the port seemed  to be about 1 cm in from the lateral-most portion of the skin incision which  I had made actually a little bit longer.  So, at that point, it should be a  straight shot in about 4-5 cm.  I also excised a little bit of the excess  fat, and I closed some of the scar tissue anteriorly over the port.  The  area was irrigated with saline.  Prior to closure and after accessing this  and connecting it, I instilled 2 cc of sterile saline into the port and  flushed it in and out several times and then was accurate to 2 cc to fill   the band.  It was then tied in place and seemed to be lying nicely.  The  wound was closed with 4-0 Vicryl and with 4-0 Monocryl subcuticularly.  I  did get a little hematoma in the  lateral-most subcuticular portion, but it was not actively bleeding, and  this seemed to be contained.  Steri-Strips was placed on the skin.  Sterile  dressing was applied.  The patient was taken to the recovery room and will  be asked to drink eight ounces of water prior to discharge from the  outpatient surgery area.                                               Thornton Park Daphine Deutscher, M.D.    MBM/MEDQ  D:  11/06/2003  T:  11/07/2003  Job:  [id number]

## 2010-09-06 NOTE — Op Note (Signed)
NAME:  KAYLIN, MARCON                       ACCOUNT NO.:  [id number]   MEDICAL RECORD NO.:  [id number]                   PATIENT TYPE:  OBV   LOCATION:  0479                                 FACILITY:  [facility]   PHYSICIAN:  Thornton Park. Daphine Deutscher, M.D.             DATE OF BIRTH:  1975/12/13   DATE OF PROCEDURE:  DATE OF DISCHARGE:                                 OPERATIVE REPORT   PREOPERATIVE DIAGNOSIS:  Morbid obesity.   POSTOPERATIVE DIAGNOSIS:  Morbid obesity.   PROCEDURE:  Laparoscopic placement of 10 cm adjustable gastric band.   SURGEON:  Thornton Park. Daphine Deutscher, M.D.   ASSISTANT:  Vikki Ports, M.D.   ANESTHESIA:  General endotracheal.   DESCRIPTION OF PROCEDURE:  Raniah Karan is a 35 year old lady who was  taken to room #1 at [facility] and given general anesthesia.  The abdomen  was prepped and draped sterilely.  I entered the abdomen using the Optiview  through a 1011 to the left of the midline for the subsequent placement of  the camera.  The abdomen was entered without difficulty and insufflated.  A  5 mm port was used in the upper midline for placement of a Nathanson  retractor to retract the liver.  I had preoperatively reviewed her upper GI.  It was suggested that she might have a small hiatal hernia.  We surveyed  this area, and there was no obvious demonstrable hiatal hernia or defect  seen in the crura.  I elected to place a 12 slightly to the right of the  midline and another 10/11 a little further right and then a 5 mm in the left  upper quadrant.  Through these, we then pulled down on the fundus.  I first  incised a little area over on the left crus for placement of the band.  We  then inserted the inflatable calibration device and put it into the stomach  and then inflated it and pulled it back to where resistance was met.  This  corresponded to the pouch above the area, which we felt was consistent with  her stomach and there did not appear to be a  significant hiatal hernia with  this device in place.  We then went ahead and proceeded to do the pars  flaccid approach.  Going through the gastrohepatic window and finding the  fat pad at that level.  Incising the edge of the muscle with the harmonic  scalpel, dilating that gently, and then inserting the Storz blunt dissector,  which then was passed behind the posterior stomach up high, coming out  through the small area that previously dissected over on the greater  curvature side.  I did not incise all of the gastric attachments to the  diaphragm on that side, which helps to secure the stomach in that area.  The  band was then introduced using a band introducer through the 12 mm port  without  difficulty, and then it was placed through the blunt dissector and  pulled around in its tract, and it came around nicely.  We felt that the 10  mm band was just right, and she did not need to be G-banded, as there was  not a lot of fat on top of the stomach.  Once placed inside the abdomen and  around in place, it was brought around, and the shoulder was brought  through.  It was then inserted in the clasp and then the sizing device was  placed down in the stomach again, and then it was clamped.  It seemed like  there was still a sufficiency of room.  With it pulled slightly to the  right, I then placed three interrupted sutures using the 2-0 Ethibond with a  free needle tying extracorporeally, and these tied down nicely.  I plicated  the stomach up over to the pouch, creating a small gastric pouch.  Once tied  and the pouch looked good, there was no evidence of any bleeding.  We then  pulled the filling device out through the 12 mm port, applied it to the  right of the midline.  Everything inside of the abdomen looked good, and all  of the ports were withdrawn, and gas was removed.  In the subcutaneous  tissue, we enlarged this pocket and went down to the fascia, placed four  sutures of 2-0  Prolene in the fascia and then I cut the excess catheter off  and then put it up onto the stem all the way.  The catheter was then tied  into place.  It laid flat.  The stem tended to go slightly towards the left  shoulder, and it was seated nicely down on the fascia.  The area was  irrigated.  The wounds were closed with 4-0 Vicryl subcutaneously with  Benzoin and Steri-Strips on the skin.  The patient did get nauseated in the  early postoperative period as she was on her way to the recovery room but  then was given antiemetics to control a little bit of vomiting early on.  Patient will be admitted for observation.                                               Thornton Park Daphine Deutscher, M.D.    MBM/MEDQ  D:  06/13/2003  T:  06/13/2003  Job:  [id number]   cc:   Jeannett Senior A. Clent Ridges, M.D. [facility]

## 2010-09-06 NOTE — Assessment & Plan Note (Signed)
[facility]                              [facility] OFFICE NOTE   JORJA, EMPIE                    MRN:          [id number]  DATE:11/14/2005                            DOB:          09/20/1975    HISTORY:  This is a 35 year old woman here for a non-gynecological physical  examination.  She feels fine and has no complaints.  She is also about to  attend some community college classes and needs some forms filled out and  some immunizations updated.   NOTATION:  On June 13, 2003, Dr. Daphine Deutscher performed a laparoscopic  gastric banding procedure.  The patient has made some radical improvements  in her health since then.  She has lost a lot of weight.  She eats a very  healthy diet.  She does admit to not getting much exercise, however.  We  have been treating her with medications for hypertension, hypothyroidism and  hyperglycemia at one point; however, she is now off of all medications and  seems to be doing well.   Her last Pap smear was a little over one year ago.  She does plan to see a  gynecologist in the near future to have this done, however.   PAST MEDICAL HISTORY/FAMILY HISTORY/SOCIAL HISTORY:  For further details  refer to her last physical noted Aug 20, 2004.   ALLERGIES:  CIPRO AND DYAZIDE.   CURRENT MEDICATIONS:  None.   PHYSICAL EXAMINATION:  GENERAL:  Height 5 feet 9 inches, weight 257 pounds.  She remains somewhat overweight.  VITAL SIGNS:  Blood pressure 110/60, pulse 72 and regular.  SKIN:  Free of significant lesions.  HEENT:  Eyes clear.  She wears glasses.  Ears are clear.  Oropharynx clear.  NECK:  Supple without lymphadenopathy or masses.  LUNGS:  Clear.  HEART:  Rate and rhythm regular.  No gallops, murmurs or rubs.  Distal  pulses are full.  ABDOMEN:  Soft, normal bowel sounds, nontender, no masses.  EXTREMITIES:  No clubbing, cyanosis or edema.  NEUROLOGIC:  Grossly intact.   She was here for fasting  laboratory on November 10, 2005.  This is remarkable  for a normal glucose of 79, normal TSH of 3.746, normal hemoglobin A1c of  5.7 and a slightly abnormal lipid panel.  Her LDL was slightly up at 120;  however, the HDL was excellent at 44 and triglycerides were normal.   ASSESSMENT/PLAN:  1.  Complete physical examination:  I did encourage her to get more exercise      and to follow up with her gynecologist, as above.  2.  History of hypertension, stable.  3.  History of hypothyroidism, stable.  4.  History of hyperglycemia, stable.  5.  School physical:  She is up-to-date on all of her immunizations.  She      had chickenpox as a child, but we will send off a varicella titer, to      satisfy school requirements.  She also had a PPD placed today.  She will      return on Monday to have this interpreted.  Tera Mater. Clent Ridges, MD   SAF/MedQ  DD:  11/14/2005  DT:  11/14/2005  Job #:  [id number]

## 2010-09-06 NOTE — Consult Note (Signed)
NAME:  Lisa Horne, Lisa Horne                       ACCOUNT NO.:  [id number]   MEDICAL RECORD NO.:  [id number]                   PATIENT TYPE:  EMS   LOCATION:  ED                                   [facility]ACILITY[facility]  [facility]   PHYSICIAN:  Currie Paris, M.D.           DATE OF BIRTH:  21-Feb-1976   DATE OF CONSULTATION:  03/08/2003  DATE OF DISCHARGE:                                   CONSULTATION   EMERGENCY ROOM VISIT NOTE:   CHIEF COMPLAINT:  Bleeding from hemorrhoid.   HISTORY OF PRESENT ILLNESS:  Lisa Horne is a 35 year old lady who  developed a thrombosed hemorrhoid.  She was apparently seen in Dr. Tera Mater Fry's office earlier today and had it lanced but it was a fairly large  one, so Dr. Ollen Gross. Lisa Horne saw her in the office today.  He advised her to  just keep some ice on it, bedrest and to call if she has continued bleeding  and apparently it had stopped by the time he saw her.  However, she had  several 4 x 4's that got blood on them and they came back to the emergency  room for reevaluation.   She overall says her pain is a little bit better than it was.   PHYSICAL EXAMINATION:  RECTAL:  On exam, she does have a thrombosed right-  sided hemorrhoid.  It is about the size of my thumb.  It has been partially  opened but is not actively bleeding currently.  There is a little bit of old  clot left in it.  Her friend is with her who saw it earlier and says that it  is about 50% smaller than it was prior her drainage procedure.   IMPRESSION:  Thrombosed hemorrhoid with some bleeding.   PLAN:  Since it has quit bleeding, I dressed it with some Surgicel and 4 x  4's.  She will go back home to bedrest, keep some ice on it and call if she  has further problems or bleeding.                                               Currie Paris, M.D.    CJS/MEDQ  D:  03/08/2003  T:  03/09/2003  Job:  [id number]

## 2010-09-06 NOTE — Discharge Summary (Signed)
NAME:  Lisa Horne, Lisa Horne                       ACCOUNT NO.:  [id number]   MEDICAL RECORD NO.:  [id number]                   PATIENT TYPE:  OBV   LOCATION:  0479                                 FACILITY:  [facility]   PHYSICIAN:  Thornton Park. Daphine Deutscher, M.D.             DATE OF BIRTH:  1975/06/19   DATE OF ADMISSION:  06/13/2003  DATE OF DISCHARGE:  06/14/2003                                 DISCHARGE SUMMARY   ADMISSION DIAGNOSIS:  Morbid obesity.   PROCEDURE:  A 10-cm laparoscopic adjustable gastric band placed June 13, 2003.   COURSE IN THE HOSPITAL:  [name] underwent a laparotomy band on  June 13, 2003.  She has done well and on June 14, 2003, had a  gastric swallow which showed her outlet to be patent and she was tolerating  liquids. She was ready for discharge and at this time was given Lortab  elixir (500/7.5 per 15 cc) in a volume of 450 cc.  Also a prescription for  Phenergan suppositories 25 mg (six) q.6h. if needed. The patient is  instructed to return to the office in one week for follow up with me.                                               Thornton Park Daphine Deutscher, M.D.    MBM/MEDQ  D:  06/14/2003  T:  06/14/2003  Job:  [id number]   cc:   Jeannett Senior A. Clent Ridges, M.D. [facility]

## 2010-09-24 ENCOUNTER — Encounter (INDEPENDENT_AMBULATORY_CARE_PROVIDER_SITE_OTHER): Payer: Self-pay | Admitting: Surgery

## 2010-11-15 ENCOUNTER — Ambulatory Visit (INDEPENDENT_AMBULATORY_CARE_PROVIDER_SITE_OTHER): Payer: BC Managed Care – PPO | Admitting: Physician Assistant

## 2010-11-15 ENCOUNTER — Encounter (INDEPENDENT_AMBULATORY_CARE_PROVIDER_SITE_OTHER): Payer: Self-pay | Admitting: Physician Assistant

## 2010-11-15 NOTE — Progress Notes (Signed)
  HISTORY: Lisa Horne is a 35 y.o.female who received an 10cm lap-band in Feb 2005 by Dr. Daphine Deutscher. She is doing well with portion sizes and says her hunger is under good control. She believes that she doesn't need an adjustment today based upon her successful weight loss and lack of untoward symptoms.  VITAL SIGNS: Filed Vitals:   11/15/10 0954  BP: 122/78    PHYSICAL EXAM: Physical exam reveals a very well-appearing 35 y.o.female in no apparent distress Neurologic: Awake, alert, oriented Psych: Bright affect, conversant Respiratory: Breathing even and unlabored. No stridor or wheezing Extremities: Atraumatic, good range of motion. Skin: Warm, Dry, no rashes Musculoskeletal: Normal gait, Joints normal  ASSESMENT: 35 y.o.  female  s/p 10 cm lap-band. She is doing well.  PLAN: We will defer an adjustment today based upon her history. We'll have her return to clinic in late September or earlier if necessary.

## 2010-11-15 NOTE — Patient Instructions (Signed)
Return to office in late September or earlier if needed.

## 2011-01-17 ENCOUNTER — Encounter (INDEPENDENT_AMBULATORY_CARE_PROVIDER_SITE_OTHER): Payer: Self-pay

## 2011-01-17 ENCOUNTER — Ambulatory Visit (INDEPENDENT_AMBULATORY_CARE_PROVIDER_SITE_OTHER): Payer: BC Managed Care – PPO | Admitting: Physician Assistant

## 2011-01-17 VITALS — BP 122/82 | HR 68 | Resp 14 | Ht 69.0 in | Wt 230.4 lb

## 2011-01-17 DIAGNOSIS — Z4651 Encounter for fitting and adjustment of gastric lap band: Secondary | ICD-10-CM

## 2011-01-17 NOTE — Progress Notes (Signed)
  HISTORY: Lisa Horne is a 35 y.o.female who received an 10cm lap-band in February 2005 by Dr. Daphine Deutscher. She comes in today in anticipation of running a half-marathon next week and is asking for some fluid to be removed. She says she's been doing very well with her current fill volume but notices when she runs longer distances that she begins to have some over-restriction. She has no persistent vomiting or regurgitation at this point.  VITAL SIGNS: Filed Vitals:   01/17/11 0907  BP: 122/82  Pulse: 68  Resp: 14    PHYSICAL EXAM: Physical exam reveals a very well-appearing 36 y.o.female in no apparent distress Neurologic: Awake, alert, oriented Psych: Bright affect, conversant Respiratory: Breathing even and unlabored. No stridor or wheezing Abdomen: Soft, nontender, nondistended to palpation. Incisions well-healed. No incisional hernias. Port easily palpated. Extremities: Atraumatic, good range of motion.  ASSESMENT: 35 y.o.  female  s/p 10cm lap-band.   PLAN: The patient's port was accessed with a 20G Huber needle without difficulty. Clear fluid was aspirated and 0.5 mL saline was removed from the port. She'll return in 2 weeks for re-evaluation and likely fill.

## 2011-01-17 NOTE — Patient Instructions (Signed)
Return in 2 weeks

## 2011-01-22 LAB — CBC
HCT: 27.7 — ABNORMAL LOW
Hemoglobin: 9.4 — ABNORMAL LOW
MCHC: 31.7
MCHC: 31.9
MCV: 70.4 — ABNORMAL LOW
Platelets: 206
RDW: 18 — ABNORMAL HIGH
RDW: 18.5 — ABNORMAL HIGH

## 2011-01-22 LAB — GLUCOSE, CAPILLARY: Glucose-Capillary: 107 — ABNORMAL HIGH

## 2011-01-31 ENCOUNTER — Encounter (INDEPENDENT_AMBULATORY_CARE_PROVIDER_SITE_OTHER): Payer: Self-pay | Admitting: Physician Assistant

## 2011-01-31 ENCOUNTER — Ambulatory Visit (INDEPENDENT_AMBULATORY_CARE_PROVIDER_SITE_OTHER): Payer: BC Managed Care – PPO | Admitting: Physician Assistant

## 2011-01-31 VITALS — BP 120/78 | HR 68 | Temp 97.9°F | Resp 14 | Ht 69.0 in | Wt 234.4 lb

## 2011-01-31 DIAGNOSIS — Z4651 Encounter for fitting and adjustment of gastric lap band: Secondary | ICD-10-CM

## 2011-01-31 NOTE — Progress Notes (Signed)
  HISTORY: Lisa Horne is a 35 y.o.female who received an 10 cm lap-band in February 2005 by Dr. Daphine Deutscher. She ran a half marathon this past Sunday and is back today to have her band adjusted back to her pre-marathon volume. She encounters debilitating reflux with a high fill volume if she runs long distances.  VITAL SIGNS: Filed Vitals:   01/31/11 0854  BP: 120/78  Pulse: 68  Temp: 97.9 F (36.6 C)  Resp: 14    PHYSICAL EXAM: Physical exam reveals a very well-appearing 35 y.o.female in no apparent distress Neurologic: Awake, alert, oriented Psych: Bright affect, conversant Respiratory: Breathing even and unlabored. No stridor or wheezing Abdomen: Soft, nontender, nondistended to palpation. Incisions well-healed. No incisional hernias. Port easily palpated. Extremities: Atraumatic, good range of motion.  ASSESMENT: 35 y.o.  female  s/p 10 cm lap-band.   PLAN: The patient's port was accessed with a 20G Huber needle without difficulty. Clear fluid was aspirated and 0.5 mL saline was added to the port. The patient was able to swallow water without difficulty following the procedure and was instructed to take clear liquids for the next 24-48 hours and advance slowly as tolerated.

## 2011-01-31 NOTE — Patient Instructions (Signed)
Take clear liquids for the next 48 hours. Thin protein shakes are ok to start on Saturday evening. Call us if you have persistent vomiting or regurgitation, night cough or reflux symptoms. Return as scheduled or sooner if you notice no changes in hunger/portion sizes.   

## 2011-02-07 ENCOUNTER — Encounter (INDEPENDENT_AMBULATORY_CARE_PROVIDER_SITE_OTHER): Payer: Self-pay | Admitting: Physician Assistant

## 2011-02-07 ENCOUNTER — Ambulatory Visit (INDEPENDENT_AMBULATORY_CARE_PROVIDER_SITE_OTHER): Payer: BC Managed Care – PPO | Admitting: Physician Assistant

## 2011-02-07 VITALS — BP 120/74 | HR 68 | Temp 97.4°F | Resp 14 | Ht 69.0 in | Wt 235.8 lb

## 2011-02-07 DIAGNOSIS — E669 Obesity, unspecified: Secondary | ICD-10-CM

## 2011-02-07 DIAGNOSIS — Z4651 Encounter for fitting and adjustment of gastric lap band: Secondary | ICD-10-CM

## 2011-02-07 NOTE — Patient Instructions (Signed)
Take clear liquids for the next 48 hours. Thin protein shakes are ok to start on Saturday evening. Call us if you have persistent vomiting or regurgitation, night cough or reflux symptoms. Return as scheduled or sooner if you notice no changes in hunger/portion sizes.   

## 2011-02-07 NOTE — Progress Notes (Signed)
  HISTORY: Lisa Horne is a 35 y.o.female who received an 10cm lap-band in February 2005 by Dr. Daphine Deutscher. She comes in having been seen last week and is still having trouble with hunger and large portion sizes. She denies vomiting or regurgitation.  VITAL SIGNS: Filed Vitals:   02/07/11 1008  BP: 120/74  Pulse: 68  Temp: 97.4 F (36.3 C)  Resp: 14    PHYSICAL EXAM: Physical exam reveals a very well-appearing 35 y.o.female in no apparent distress Neurologic: Awake, alert, oriented Psych: Bright affect, conversant Respiratory: Breathing even and unlabored. No stridor or wheezing Abdomen: Soft, nontender, nondistended to palpation. Incisions well-healed. No incisional hernias. Port easily palpated. Extremities: Atraumatic, good range of motion.  ASSESMENT: 35 y.o.  female  s/p 10cm lap-band.   PLAN: The patient's port was accessed with a 20G Huber needle without difficulty. Clear fluid was aspirated and 0.2 mL saline was added to the port. The patient was able to swallow water without difficulty following the procedure and was instructed to take clear liquids for the next 24-48 hours and advance slowly as tolerated.

## 2011-03-06 ENCOUNTER — Telehealth: Payer: Self-pay | Admitting: Family Medicine

## 2011-03-06 NOTE — Telephone Encounter (Signed)
Pt called and is req a cpx before end of year for insurance purposes. Pls advise.

## 2011-03-07 NOTE — Telephone Encounter (Signed)
Pls set up appt.

## 2011-03-07 NOTE — Telephone Encounter (Signed)
Okay to set up in early December

## 2011-03-10 NOTE — Telephone Encounter (Signed)
Called pt on 03/07/11 and sch pt for cpx labs on 03/19/11 and cpx in early Dec, as noted.

## 2011-03-19 ENCOUNTER — Other Ambulatory Visit (INDEPENDENT_AMBULATORY_CARE_PROVIDER_SITE_OTHER): Payer: Self-pay

## 2011-03-19 DIAGNOSIS — Z0000 Encounter for general adult medical examination without abnormal findings: Secondary | ICD-10-CM

## 2011-03-19 LAB — POCT URINALYSIS DIPSTICK
Glucose, UA: NEGATIVE
Nitrite, UA: NEGATIVE
Protein, UA: NEGATIVE
Spec Grav, UA: 1.025
Urobilinogen, UA: 0.2
pH, UA: 5.5

## 2011-03-20 LAB — HEPATIC FUNCTION PANEL
Albumin: 4.2 g/dL (ref 3.5–5.2)
Alkaline Phosphatase: 55 U/L (ref 39–117)
Total Bilirubin: 0.3 mg/dL (ref 0.3–1.2)
Total Protein: 7.1 g/dL (ref 6.0–8.3)

## 2011-03-20 LAB — LIPID PANEL
Cholesterol: 177 mg/dL (ref 0–200)
HDL: 40 mg/dL (ref >39)
LDL Cholesterol: 124 mg/dL — ABNORMAL HIGH (ref 0–99)
Triglycerides: 66 mg/dL (ref <150)
VLDL: 13 mg/dL (ref 0–40)

## 2011-03-20 LAB — CBC WITH DIFFERENTIAL/PLATELET
Basophils Absolute: 0.1 10*3/uL (ref 0.0–0.1)
Eosinophils Relative: 4 % (ref 0–5)
Lymphocytes Relative: 26 % (ref 12–46)
Lymphs Abs: 1.8 10*3/uL (ref 0.7–4.0)
Neutrophils Relative %: 59 % (ref 43–77)
Platelets: 282 10*3/uL (ref 150–400)
RBC: 4.6 MIL/uL (ref 3.87–5.11)
RDW: 16 % — ABNORMAL HIGH (ref 11.5–15.5)
WBC: 6.7 10*3/uL (ref 4.0–10.5)

## 2011-03-20 LAB — BASIC METABOLIC PANEL
BUN: 14 mg/dL (ref 6–23)
Calcium: 9.2 mg/dL (ref 8.4–10.5)
Creat: 0.83 mg/dL (ref 0.50–1.10)
Glucose, Bld: 93 mg/dL (ref 70–99)

## 2011-03-27 NOTE — Progress Notes (Signed)
Quick Note:  Left voice message ______ 

## 2011-03-28 ENCOUNTER — Ambulatory Visit (INDEPENDENT_AMBULATORY_CARE_PROVIDER_SITE_OTHER): Payer: Self-pay | Admitting: Family Medicine

## 2011-03-28 ENCOUNTER — Encounter: Payer: Self-pay | Admitting: Family Medicine

## 2011-03-28 VITALS — BP 124/88 | HR 86 | Temp 98.8°F | Ht 68.0 in | Wt 234.0 lb

## 2011-03-28 DIAGNOSIS — Z0000 Encounter for general adult medical examination without abnormal findings: Secondary | ICD-10-CM

## 2011-03-28 MED ORDER — LEVOTHYROXINE SODIUM 100 MCG PO TABS: 100.0000 ug | ORAL_TABLET | Freq: Every day | ORAL | Status: DC

## 2011-03-28 NOTE — Progress Notes (Signed)
  Subjective:    Patient ID: Lisa Horne, female    DOB: 1976/01/04, 35 y.o.   MRN: [id number]  HPI 35 yr old female for a well exam. She feels fine except for some chronic fatigue and an inability to lose weight depsite consuming only 1200 calories a day and exercising. She admits to stopping her Synthroid about 9 months ago because she "does not like to take pills". Now she realizes she was wrong and she wants to restart this. In fact her recent TSH was high at 7.85.    Review of Systems  Constitutional: Positive for fatigue. Negative for fever, diaphoresis, activity change, appetite change and unexpected weight change.  HENT: Negative.  Negative for hearing loss, ear pain, nosebleeds, congestion, sore throat, trouble swallowing, neck pain, neck stiffness, voice change and tinnitus.   Eyes: Negative.  Negative for photophobia, pain, discharge, redness and visual disturbance.  Respiratory: Negative.  Negative for apnea, cough, choking, chest tightness, shortness of breath, wheezing and stridor.   Cardiovascular: Negative.  Negative for chest pain, palpitations and leg swelling.  Gastrointestinal: Negative.  Negative for nausea, vomiting, abdominal pain, diarrhea, constipation, blood in stool, abdominal distention and rectal pain.  Genitourinary: Negative.  Negative for dysuria, urgency, frequency, hematuria, flank pain, vaginal bleeding, vaginal discharge, enuresis, difficulty urinating, vaginal pain and menstrual problem.  Musculoskeletal: Negative.  Negative for myalgias, back pain, joint swelling, arthralgias and gait problem.  Skin: Negative.  Negative for color change, pallor, rash and wound.  Neurological: Negative.  Negative for dizziness, tremors, seizures, syncope, speech difficulty, weakness, light-headedness, numbness and headaches.  Hematological: Negative.  Negative for adenopathy. Does not bruise/bleed easily.  Psychiatric/Behavioral: Negative.  Negative for hallucinations,  behavioral problems, confusion, sleep disturbance, dysphoric mood and agitation. The patient is not nervous/anxious.        Objective:   Physical Exam  Constitutional: She is oriented to person, place, and time. She appears well-developed and well-nourished. No distress.  HENT:  Head: Normocephalic and atraumatic.  Right Ear: External ear normal.  Left Ear: External ear normal.  Nose: Nose normal.  Mouth/Throat: Oropharynx is clear and moist. No oropharyngeal exudate.  Eyes: Conjunctivae and EOM are normal. Pupils are equal, round, and reactive to light. No scleral icterus.  Neck: Normal range of motion. Neck supple. No JVD present. No thyromegaly present.  Cardiovascular: Normal rate, regular rhythm, normal heart sounds and intact distal pulses.  Exam reveals no gallop and no friction rub.   No murmur heard. Pulmonary/Chest: Effort normal and breath sounds normal. No respiratory distress. She has no wheezes. She has no rales. She exhibits no tenderness.  Abdominal: Soft. Bowel sounds are normal. She exhibits no distension and no mass. There is no tenderness. There is no rebound and no guarding.  Musculoskeletal: Normal range of motion. She exhibits no edema and no tenderness.  Lymphadenopathy:    She has no cervical adenopathy.  Neurological: She is alert and oriented to person, place, and time. She has normal reflexes. No cranial nerve deficit. She exhibits normal muscle tone. Coordination normal.  Skin: Skin is warm and dry. No rash noted. No erythema.  Psychiatric: She has a normal mood and affect. Her behavior is normal. Judgment and thought content normal.          Assessment & Plan:  Well exam. Get back on Synthroid at her previous dose of 100 mcg a day. Recheck a TSH in 2 months

## 2011-04-07 ENCOUNTER — Encounter (INDEPENDENT_AMBULATORY_CARE_PROVIDER_SITE_OTHER): Payer: Self-pay | Admitting: Surgery

## 2011-04-25 ENCOUNTER — Encounter (INDEPENDENT_AMBULATORY_CARE_PROVIDER_SITE_OTHER): Payer: BC Managed Care – PPO

## 2011-05-23 ENCOUNTER — Ambulatory Visit (INDEPENDENT_AMBULATORY_CARE_PROVIDER_SITE_OTHER): Payer: 59 | Admitting: Physician Assistant

## 2011-05-23 ENCOUNTER — Encounter (INDEPENDENT_AMBULATORY_CARE_PROVIDER_SITE_OTHER): Payer: Self-pay

## 2011-05-23 VITALS — BP 114/70 | HR 68 | Temp 98.0°F | Resp 16 | Ht 69.0 in | Wt 239.0 lb

## 2011-05-23 DIAGNOSIS — Z4651 Encounter for fitting and adjustment of gastric lap band: Secondary | ICD-10-CM

## 2011-05-23 NOTE — Progress Notes (Signed)
  HISTORY: Lisa Horne is a 36 y.o.female who received an 10cm lap-band in February 2005 by Dr. Daphine Deutscher. She was last seen in October for a fill but since then has felt practically no restriction. She has no vomiting complaints.  VITAL SIGNS: Filed Vitals:   05/23/11 0832  BP: 114/70  Pulse: 68  Temp: 98 F (36.7 C)  Resp: 16    PHYSICAL EXAM: Physical exam reveals a very well-appearing 36 y.o.female in no apparent distress Neurologic: Awake, alert, oriented Psych: Bright affect, conversant Respiratory: Breathing even and unlabored. No stridor or wheezing Abdomen: Soft, nontender, nondistended to palpation. Incisions well-healed. No incisional hernias. Port easily palpated. Extremities: Atraumatic, good range of motion.  ASSESMENT: 36 y.o.  female  s/p 10cm lap-band.   PLAN: The patient's port was accessed with a 20G Huber needle without difficulty. Clear fluid was aspirated and 0.2 mL saline was added to the port to give a total predicted volume of 4 mL. This would be the theoretical capacity of the band. The patient was able to swallow water without difficulty following the procedure and was instructed to take clear liquids for the next 24-48 hours and advance slowly as tolerated. She will follow-up with Dr. Daphine Deutscher later in the month if her lack of weight loss/restriction persists.

## 2011-05-23 NOTE — Patient Instructions (Signed)
Take clear liquids for the next 48 hours. Thin protein shakes are ok to start on Saturday evening. You may begin foods with the consistency of yogurt, cottage cheese, cream soups, etc. on Sunday. Call us if you have persistent vomiting or regurgitation, night cough or reflux symptoms. Return as scheduled or sooner if you notice no changes in hunger/portion sizes.   

## 2011-05-29 ENCOUNTER — Telehealth: Payer: Self-pay | Admitting: Family Medicine

## 2011-05-29 NOTE — Telephone Encounter (Signed)
Pt would like to repeat tsh. Can I sch?

## 2011-05-29 NOTE — Telephone Encounter (Signed)
Pt is sch for 05-30-2011 230pm

## 2011-05-29 NOTE — Telephone Encounter (Signed)
Yes, please set up a TSH for hypothyroidism

## 2011-05-30 ENCOUNTER — Other Ambulatory Visit (INDEPENDENT_AMBULATORY_CARE_PROVIDER_SITE_OTHER): Payer: Self-pay

## 2011-05-30 DIAGNOSIS — E039 Hypothyroidism, unspecified: Secondary | ICD-10-CM

## 2011-06-04 NOTE — Progress Notes (Signed)
Quick Note:  Left voice message ______ 

## 2011-06-13 ENCOUNTER — Ambulatory Visit (INDEPENDENT_AMBULATORY_CARE_PROVIDER_SITE_OTHER): Payer: 59 | Admitting: Surgery

## 2011-06-13 ENCOUNTER — Encounter (INDEPENDENT_AMBULATORY_CARE_PROVIDER_SITE_OTHER): Payer: Self-pay | Admitting: Surgery

## 2011-06-13 VITALS — BP 142/98 | HR 76 | Temp 98.5°F | Resp 16 | Ht 69.0 in | Wt 241.8 lb

## 2011-06-13 DIAGNOSIS — Z9884 Bariatric surgery status: Secondary | ICD-10-CM | POA: Insufficient documentation

## 2011-06-13 NOTE — Progress Notes (Signed)
Lisa Horne Body mass index is 35.71 kg/(m^2).  Having regurgitation:  no  Nocturnal reflux?  no  Amount of fill  I aspirated only 2 cc and [name] had put in 4 cc about 2 weeks ago.  She is not restricted.  I will see her in 2 weeks and if only 2 cc is in the band then we will need to replace her port.

## 2011-06-14 ENCOUNTER — Encounter (HOSPITAL_COMMUNITY): Payer: Self-pay | Admitting: Adult Health

## 2011-06-14 ENCOUNTER — Emergency Department (HOSPITAL_COMMUNITY)
Admission: EM | Admit: 2011-06-14 | Discharge: 2011-06-14 | Disposition: A | Discharge status: Home / Self Care | Payer: 59 | Attending: General Surgery | Admitting: General Surgery

## 2011-06-14 DIAGNOSIS — Z9884 Bariatric surgery status: Secondary | ICD-10-CM | POA: Insufficient documentation

## 2011-06-14 DIAGNOSIS — R112 Nausea with vomiting, unspecified: Secondary | ICD-10-CM | POA: Insufficient documentation

## 2011-06-14 NOTE — ED Notes (Signed)
Chief complaint: Can't tolerate fluids after lap band fill  History: This is a LAP-BAND patient of Dr. Luretha Murphy. She has had her band for 8 years. Yesterday she had a 2 cc fill in the office. Since last night she has been unable to tolerate any fluids by mouth. She called this morning I asked her to come to the emergency room.  Exam: BP 138/77  Pulse 90  Temp(Src) 98.9 F (37.2 C) (Oral)  Resp 16  Wt 241 lb (109.317 kg)  SpO2 100%  Gen.: She is not appear ill. Abdomen: Soft and nontender. Port site looks fine.  Procedure: I accessed her port and 2 cc was removed. This was under a fair amount of pressure. If she is able to tolerate fluids she will be discharged home. She has a followup with Dr. Daphine Deutscher in 2 weeks.  Mariella Saa MD, FACS  06/14/2011, 10:15 AM

## 2011-06-14 NOTE — ED Notes (Signed)
Pt here to lap and fluid removal. Had Lap band several years ago, recently has some fluid inserted and felt nauseous and vomiting since the fluid addition. Dr. Johna Sheriff called, and will come to extract some fluid out. All materials at bedside.

## 2011-06-14 NOTE — ED Notes (Signed)
Pt to be seen by MD Hoxworth for fluid removal, MD Hoxworth paged upon pt arrival

## 2011-06-14 NOTE — ED Notes (Signed)
Dr. Johna Sheriff at bedside, and has extracted 2cc of fluid out of lap band.

## 2011-06-27 ENCOUNTER — Encounter (INDEPENDENT_AMBULATORY_CARE_PROVIDER_SITE_OTHER): Payer: Self-pay | Admitting: Surgery

## 2011-06-27 ENCOUNTER — Ambulatory Visit (INDEPENDENT_AMBULATORY_CARE_PROVIDER_SITE_OTHER): Payer: 59 | Admitting: Surgery

## 2011-06-27 DIAGNOSIS — Z9884 Bariatric surgery status: Secondary | ICD-10-CM

## 2011-06-27 DIAGNOSIS — R1310 Dysphagia, unspecified: Secondary | ICD-10-CM

## 2011-06-27 NOTE — Progress Notes (Signed)
Lisa Horne Body mass index is 35.71 kg/(m^2).  Having regurgitation:  no  Nocturnal reflux?  no  Amount of fill  I aspirated only 2 cc and [name] had put in 4 cc about 2 weeks ago.  She is not restricted.  I will see her in 2 weeks and if only 2 cc is in the band then we will need to replace her port.  [name] comes in today and last time I filled her she was too tight  And  she had to go to emergency room see Dr. Johna Sheriff to have fluid removed.   She has had some intermittent dypshagia so I think that we need to get an upper GI to rule out a slip.    Return afte UGI

## 2011-07-01 ENCOUNTER — Ambulatory Visit
Admission: RE | Admit: 2011-07-01 | Discharge: 2011-07-01 | Disposition: A | Discharge status: Home / Self Care | Payer: 59 | Source: Ambulatory Visit | Attending: Surgery | Admitting: Surgery

## 2011-07-01 DIAGNOSIS — Z9884 Bariatric surgery status: Secondary | ICD-10-CM

## 2011-07-02 ENCOUNTER — Ambulatory Visit (INDEPENDENT_AMBULATORY_CARE_PROVIDER_SITE_OTHER): Payer: 59 | Admitting: Surgery

## 2011-07-02 ENCOUNTER — Encounter (INDEPENDENT_AMBULATORY_CARE_PROVIDER_SITE_OTHER): Payer: Self-pay | Admitting: Surgery

## 2011-07-02 VITALS — BP 129/89 | HR 68 | Temp 97.9°F | Ht 69.0 in | Wt 237.2 lb

## 2011-07-02 DIAGNOSIS — Z9884 Bariatric surgery status: Secondary | ICD-10-CM

## 2011-07-02 NOTE — Progress Notes (Signed)
Moffit comes in today and we reviewed her upper GI. She is clearly overfilled. She probably lost total 130 pounds since her band was put in. I went ahead and pulled back about reducing her about the 0.5 cc. She has 1.75 cc in her 10 cm band. I'll see her back in the office in 2-3 months and followup. The meantime she is training for a half marathon.

## 2011-08-04 ENCOUNTER — Telehealth (INDEPENDENT_AMBULATORY_CARE_PROVIDER_SITE_OTHER): Payer: Self-pay | Admitting: Surgery

## 2011-08-04 NOTE — Telephone Encounter (Signed)
Patient requested criteria for removal of lap band and conversion to RNY gastric bypass. Mailed [employer] criteria to patient and advised her to contact customer service at the # on the back of her insurance card for any criteria specific to her employer as well as to see if they cover more than one procedure per lifetime...cef

## 2011-08-07 ENCOUNTER — Ambulatory Visit (INDEPENDENT_AMBULATORY_CARE_PROVIDER_SITE_OTHER): Payer: 59 | Admitting: Physician Assistant

## 2011-08-07 ENCOUNTER — Encounter (INDEPENDENT_AMBULATORY_CARE_PROVIDER_SITE_OTHER): Payer: Self-pay

## 2011-08-07 VITALS — BP 130/84 | Ht 69.0 in | Wt 245.0 lb

## 2011-08-07 DIAGNOSIS — Z4651 Encounter for fitting and adjustment of gastric lap band: Secondary | ICD-10-CM

## 2011-08-07 NOTE — Patient Instructions (Signed)
Take clear liquids tonight. Thin protein shakes are ok to start tomorrow morning. Slowly advance your diet thereafter. Call us if you have persistent vomiting or regurgitation, night cough or reflux symptoms. Return as scheduled or sooner if you notice no changes in hunger/portion sizes.  

## 2011-08-07 NOTE — Progress Notes (Signed)
  HISTORY: Lisa Horne is a 36 y.o.female who received an 10cm lap-band in February 2005 by Dr. Daphine Deutscher. She comes in with increasing hunger since removal of 0.63mL fluid last month by Dr. Sallyanne Havers. She's had difficulty getting back to the green zone since removal of fluid some time ago.  VITAL SIGNS: Filed Vitals:   08/07/11 1028  BP: 130/84    PHYSICAL EXAM: Physical exam reveals a very well-appearing 36 y.o.female in no apparent distress Neurologic: Awake, alert, oriented Psych: Bright affect, conversant Respiratory: Breathing even and unlabored. No stridor or wheezing Abdomen: Soft, nontender, nondistended to palpation. Incisions well-healed. No incisional hernias. Port easily palpated. Extremities: Atraumatic, good range of motion.  ASSESMENT: 36 y.o.  female  s/p 10cm lap-band.   PLAN: The patient's port was accessed with a 20G Huber needle without difficulty. Clear fluid was aspirated and 0.3 mL saline was added to the port to give a total predicted volume of 2.05 mL. The patient was able to swallow water without difficulty following the procedure and was instructed to take clear liquids for the next 24-48 hours and advance slowly as tolerated.

## 2011-08-11 ENCOUNTER — Telehealth (INDEPENDENT_AMBULATORY_CARE_PROVIDER_SITE_OTHER): Payer: Self-pay | Admitting: General Surgery

## 2011-08-11 ENCOUNTER — Telehealth (INDEPENDENT_AMBULATORY_CARE_PROVIDER_SITE_OTHER): Payer: Self-pay | Admitting: Surgery

## 2011-08-11 NOTE — Telephone Encounter (Signed)
08/08/11 pt inquired about conversion from lap band to RNY gastric bypass surgery. Adv pt that, per [facility], her policy will cover a second bariatric procedure as long as she follows/meets criteria for surgery. The first step would be to have a consultation with Dr Daphine Deutscher to discuss revision. She will need a psychological evaluation, nutrition assessment, and current lab work along with documentation that she was compliant with the lap band diet/exercise...advised the patient to call [phone] to schedule a consultation with Dr Daphine Deutscher....cef

## 2011-08-11 NOTE — Telephone Encounter (Signed)
Called patient home # [phone], did not leave message, line was not picked up. Called patient cell [phone] and left message to advise her appointment was made and to please call the office to confirm appointment. I did not leave the exact appointment date or time as I did not have permission from patient to leave that information on vmail.   Per discussion with Okey Regal appointment needs to be in 30 minute slot. Patient scheduled for May 16th at 11:10.

## 2011-08-11 NOTE — Telephone Encounter (Signed)
Called patient per her message left with Orpha Bur at front desk. Appointment changed from 09/04/11 to 09/18/11 at 10:00. Advised patient who agreed to appointment time.

## 2011-09-04 ENCOUNTER — Encounter (INDEPENDENT_AMBULATORY_CARE_PROVIDER_SITE_OTHER): Payer: 59 | Admitting: Surgery

## 2011-09-18 ENCOUNTER — Ambulatory Visit (INDEPENDENT_AMBULATORY_CARE_PROVIDER_SITE_OTHER): Payer: 59 | Admitting: Surgery

## 2011-09-18 ENCOUNTER — Encounter (INDEPENDENT_AMBULATORY_CARE_PROVIDER_SITE_OTHER): Payer: Self-pay | Admitting: Surgery

## 2011-09-18 VITALS — BP 130/81 | Ht 69.0 in | Wt 243.8 lb

## 2011-09-18 DIAGNOSIS — Z9884 Bariatric surgery status: Secondary | ICD-10-CM

## 2011-09-18 NOTE — Progress Notes (Signed)
Unice wants to be below 200 pounds. She has been happy with her band to a point but when she has further restriction she gets reflux. We discussed converting her to a sleeve for bypass and now would frankly favor the latter if we had to have a conversion. Another option would be to try go repair hiatal hernia and place an AP system and where she is. I tore him to go into a course on sleeves and we will be researching her insurance coverage for such. In the meantime she is in a core about the belt program. I'll see her back in to 3 weeks we can continue this discussion.

## 2011-10-02 ENCOUNTER — Ambulatory Visit (INDEPENDENT_AMBULATORY_CARE_PROVIDER_SITE_OTHER): Payer: 59 | Admitting: Surgery

## 2011-10-02 ENCOUNTER — Encounter (INDEPENDENT_AMBULATORY_CARE_PROVIDER_SITE_OTHER): Payer: Self-pay | Admitting: Surgery

## 2011-10-02 ENCOUNTER — Other Ambulatory Visit (INDEPENDENT_AMBULATORY_CARE_PROVIDER_SITE_OTHER): Payer: Self-pay | Admitting: Surgery

## 2011-10-02 VITALS — Ht 69.0 in | Wt 242.1 lb

## 2011-10-02 DIAGNOSIS — Z9884 Bariatric surgery status: Secondary | ICD-10-CM

## 2011-10-02 LAB — COMPREHENSIVE METABOLIC PANEL
ALT: 11 U/L (ref 0–35)
CO2: 26 mEq/L (ref 19–32)
Calcium: 9.3 mg/dL (ref 8.4–10.5)
Chloride: 107 mEq/L (ref 96–112)
Creat: 0.96 mg/dL (ref 0.50–1.10)
Glucose, Bld: 87 mg/dL (ref 70–99)
Total Protein: 7.3 g/dL (ref 6.0–8.3)

## 2011-10-02 LAB — TSH: TSH: 5.289 u[IU]/mL — ABNORMAL HIGH (ref 0.350–4.500)

## 2011-10-02 LAB — HEMOGLOBIN A1C: Hgb A1c MFr Bld: 5.5 % (ref <5.7)

## 2011-10-02 NOTE — Progress Notes (Signed)
Lisa Horne 36 y.o.  Body mass index is 35.76 kg/(m^2).  Patient Active Problem List  Diagnosis  . HYPOTHYROIDISM  . HYPERTENSION  . SPRAIN&STRAIN OF UNSPECIFIED SITE OF HIP&THIGH  . BACK PAIN, LUMBAR  . History of laparoscopic adjustable gastric banding    Allergies  Allergen Reactions  . Ciprofloxacin     REACTION: severe headache/vomiting  . Hydrochlorothiazide W-Triamterene     REACTION: malaise    Past Surgical History  Procedure Date  . Gastric restriction surgery     lap band 2005  . Gastric banding port revision    FRY,STEPHEN A, MD No diagnosis found.  Lisa Horne comes in today we discussed our gastric sleeve. Lisa Horne has outlined what she would need in order to qualify for revisional surgery and that includes a psych evaluation, nutritional assessment, an H&P by me, and lateral medical necessity from her primary care provider, and a BMI greater than 35. She brought out using phentermine and Topamax and that her insurance wouldn't pay for these individually but not as the combined product. That's off label for me to prescribe. However I did have a sample of Suprenza  30mg . which she is going to research and begin taking one a day. A sample size is 7 capsules and if she is tolerating this I will prescribe her enough for an additional 3 weeks of therapy to see if this impacts her weight. I'll see her back in 4 weeks. In the meantime she will call Lisa Horne next week and let us know how the supine scissors working for her. Matt B. Daphine Deutscher, MD, [facility] [phone] beeper [phone]  10/02/2011 10:35 AM

## 2011-10-02 NOTE — Patient Instructions (Addendum)
Take the Suprenza 30 mg QD Call in 1 week to confirm its benefits and if you want to complete a one month course.

## 2011-10-06 ENCOUNTER — Telehealth (INDEPENDENT_AMBULATORY_CARE_PROVIDER_SITE_OTHER): Payer: Self-pay | Admitting: General Surgery

## 2011-10-06 NOTE — Telephone Encounter (Signed)
Returned [employer] and she stated that the diet pill medication was working fabulous and would like to have an rx for it. Expressed she is headed out of town this Wednesday and would like to have it by then.

## 2011-10-06 NOTE — Telephone Encounter (Signed)
Pt calling and asking for French Ana to report results of new meds, ordered by Dr. Daphine Deutscher.  Aware French Ana is with a physician today, but will get the message to call her.

## 2011-10-07 ENCOUNTER — Telehealth: Payer: Self-pay | Admitting: Family Medicine

## 2011-10-07 MED ORDER — LEVOTHYROXINE SODIUM 125 MCG PO TABS: 125.0000 ug | ORAL_TABLET | Freq: Every day | ORAL | Status: DC

## 2011-10-07 NOTE — Telephone Encounter (Signed)
I spoke with pt and gave lab results and Dr. Clent Ridges did increase the Snythroid from 100 mcg to 125 mcg. I did send script e-scribe.

## 2011-10-08 ENCOUNTER — Telehealth (INDEPENDENT_AMBULATORY_CARE_PROVIDER_SITE_OTHER): Payer: Self-pay

## 2011-10-08 ENCOUNTER — Other Ambulatory Visit (INDEPENDENT_AMBULATORY_CARE_PROVIDER_SITE_OTHER): Payer: Self-pay

## 2011-10-08 MED ORDER — PHENTERMINE HCL 30 MG PO CAPS: 30.0000 mg | ORAL_CAPSULE | Freq: Every day | ORAL | Status: DC

## 2011-10-08 NOTE — Telephone Encounter (Signed)
Patient calling in to check the status of her request for Phentermine.  Paged Dr. Daphine Deutscher- okay to fill Phentermine Bertram Millard) patient to take 1 po qd, #30 0 refills.

## 2011-10-15 ENCOUNTER — Encounter (INDEPENDENT_AMBULATORY_CARE_PROVIDER_SITE_OTHER): Payer: Self-pay

## 2011-10-31 ENCOUNTER — Other Ambulatory Visit: Payer: Self-pay | Admitting: Family Medicine

## 2011-11-04 ENCOUNTER — Other Ambulatory Visit: Payer: Self-pay | Admitting: Family Medicine

## 2011-11-07 ENCOUNTER — Other Ambulatory Visit: Payer: Self-pay | Admitting: Family Medicine

## 2011-11-10 ENCOUNTER — Other Ambulatory Visit: Payer: Self-pay | Admitting: Family Medicine

## 2011-12-08 ENCOUNTER — Encounter (HOSPITAL_COMMUNITY): Payer: Self-pay | Admitting: Emergency Medicine

## 2011-12-08 ENCOUNTER — Emergency Department (INDEPENDENT_AMBULATORY_CARE_PROVIDER_SITE_OTHER)
Admission: EM | Admit: 2011-12-08 | Discharge: 2011-12-08 | Disposition: A | Discharge status: Home / Self Care | Payer: 59 | Source: Home / Self Care | Attending: Family Medicine | Admitting: Family Medicine

## 2011-12-08 DIAGNOSIS — S8001XA Contusion of right knee, initial encounter: Secondary | ICD-10-CM

## 2011-12-08 DIAGNOSIS — S8000XA Contusion of unspecified knee, initial encounter: Secondary | ICD-10-CM

## 2011-12-08 NOTE — ED Provider Notes (Signed)
History     CSN: [id number]  Arrival date & time 12/08/11  1639   First MD Initiated Contact with Patient 12/08/11 1643      Chief Complaint  Patient presents with  . Knee Injury    (Consider location/radiation/quality/duration/timing/severity/associated sxs/prior treatment) Patient is a 36 y.o. female presenting with leg pain. The history is provided by the patient.  Leg Pain  The incident occurred more than 2 days ago. [place]. The injury mechanism was a fall. The pain is present in the right knee. The quality of the pain is described as throbbing. The pain is mild. Pertinent negatives include no inability to bear weight and no loss of motion. She reports no foreign bodies present. She has tried ice for the symptoms. The treatment provided mild relief.    Past Medical History  Diagnosis Date  . Thyroid disease     hypothyroidism    Past Surgical History  Procedure Date  . Gastric restriction surgery     lap band 2005  . Gastric banding port revision     Family History  Problem Relation Age of Onset  . Diabetes Father   . Cancer Father     skin  . Hypertension Mother   . Cancer Mother     breast  . Depression Mother   . Depression Sister     History  Substance Use Topics  . Smoking status: Never Smoker   . Smokeless tobacco: Never Used  . Alcohol Use: No    OB History    Grav Para Term Preterm Abortions TAB SAB Ect Mult Living                  Review of Systems  Constitutional: Negative.   Musculoskeletal: Positive for gait problem. Negative for joint swelling.    Allergies  Ciprofloxacin and Hydrochlorothiazide w-triamterene  Home Medications   Current Outpatient Rx  Name Route Sig Dispense Refill  . BIOTIN 5000 PO Oral Take by mouth.    Marland Kitchen CALCIUM 600+D PO Oral Take by mouth.    . CO Q 10 PO Oral Take by mouth.    Marland Kitchen FLEXERIL 10 MG PO TABS  TAKE 1 TABLET 3 TIMES A DAY AS NEEDED. 90 each 3  . LEVOTHYROXINE SODIUM 125  MCG PO TABS Oral Take 1 tablet (125 mcg total) by mouth daily. 30 tablet 11  . MULTIVITAMINS PO CAPS Oral Take 1 capsule by mouth daily.    Marland Kitchen PHENTERMINE HCL 30 MG PO CAPS        BP 131/76  Pulse 80  Temp 98.5 F (36.9 C) (Oral)  Resp 20  SpO2 99%  LMP 11/21/2011  Physical Exam  Nursing note and vitals reviewed. Constitutional: She is oriented to person, place, and time. She appears well-developed and well-nourished.  Musculoskeletal: Normal range of motion. She exhibits tenderness.       Right knee: She exhibits ecchymosis. She exhibits normal range of motion, no swelling and no effusion.       Legs: Neurological: She is alert and oriented to person, place, and time.  Skin: Skin is warm and dry.    ED Course  Procedures (including critical care time)  Labs Reviewed - No data to display No results found.   1. Contusion of right knee       MDM          Linna Hoff, MD 12/10/11 2017

## 2011-12-08 NOTE — ED Notes (Signed)
Tripped and fell 4 days ago striking right knee on boardwalk. Has moderate amt of bruising and swelling to right knee. Has been able to walk on it but has discomfort.

## 2011-12-10 ENCOUNTER — Encounter (INDEPENDENT_AMBULATORY_CARE_PROVIDER_SITE_OTHER): Payer: Self-pay | Admitting: Surgery

## 2011-12-10 ENCOUNTER — Ambulatory Visit (INDEPENDENT_AMBULATORY_CARE_PROVIDER_SITE_OTHER): Payer: 59 | Admitting: Surgery

## 2011-12-10 VITALS — BP 136/70 | HR 68 | Temp 98.6°F | Resp 16 | Ht 68.0 in | Wt 246.0 lb

## 2011-12-10 DIAGNOSIS — E669 Obesity, unspecified: Secondary | ICD-10-CM

## 2011-12-10 NOTE — Progress Notes (Signed)
Chief Complaint:  lapband failure to lose   History of Present Illness:  Lisa Horne is an 36 y.o. female who has a 10 cm lapband since 2005.  She has been followed by me for a number of years with numerous interventions none. Her preop weight was 322 and 3 a BMI was 48. Current BMI is 37. She still has some issues with hypertension and she was prepared it although I think that's been under control. 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  Recently we tried her on some phentermine which has made her workout much more but she has not lost any more weight with it. She seems stuck in an obese weight despite doing everything that's been asked of her from a blood bank applied standpoint as well as trying medical adjuncts to lose weight. We discussed other interventions and she's decided she was to convert to a [place] bypass. I think that she does have a BMI over 35 and has multiple comorbidities related to her weight including her fluctuating hypertension and arthritis.  Past Medical History  Diagnosis Date  . Thyroid disease     hypothyroidism    Past Surgical History  Procedure Date  . Gastric restriction surgery     lap band 2005  . Gastric banding port revision     Current Outpatient Prescriptions  Medication Sig Dispense Refill  . BIOTIN 5000 PO Take by mouth.      . Calcium Carbonate-Vitamin D (CALCIUM 600+D PO) Take by mouth.      . Coenzyme Q10 (CO Q 10 PO) Take by mouth.      Marland Kitchen FLEXERIL 10 MG tablet TAKE 1 TABLET 3 TIMES A DAY AS NEEDED.  90 each  3  . levothyroxine (SYNTHROID, LEVOTHROID) 125 MCG tablet Take 1 tablet (125 mcg total) by mouth daily.  30 tablet  11  . Multiple Vitamin (MULTIVITAMIN) capsule Take 1 capsule by mouth daily.      . phentermine 30 MG capsule        Ciprofloxacin and Hydrochlorothiazide w-triamterene Family History  Problem Relation Age of Onset  . Diabetes Father   . Cancer Father     skin  . Hypertension Mother   . Cancer Mother    breast  . Depression Mother   . Depression Sister    Social History:   reports that she has never smoked. She has never used smokeless tobacco. She reports that she does not drink alcohol or use illicit drugs.   REVIEW OF SYSTEMS - PERTINENT POSITIVES ONLY: See old chart  Physical Exam:   Blood pressure 136/70, pulse 68, temperature 98.6 F (37 C), temperature source Temporal, resp. rate 16, height 5\' 8"  (1.727 m), weight 246 lb (111.585 kg), last menstrual period 11/21/2011. Body mass index is 37.40 kg/(m^2).  Gen:  WDWN WF NAD  Neurological: Alert and oriented to person, place, and time. Motor and sensory function is grossly intact  Head: Normocephalic and atraumatic.  Eyes: Conjunctivae are normal. Pupils are equal, round, and reactive to light. No scleral icterus.  Neck: Normal range of motion. Neck supple. No tracheal deviation or thyromegaly present.  Cardiovascular:  SR without murmurs or gallops.  No carotid bruits Respiratory: Effort normal.  No respiratory distress. No chest wall tenderness. Breath sounds normal.  No wheezes, rales or rhonchi.  Abdomen:  Nontender.  Ports healed with no herniae GU: Musculoskeletal: Normal range of motion. Extremities are nontender. No cyanosis, edema or clubbing noted Lymphadenopathy: No  cervical, preauricular, postauricular or axillary adenopathy is present Skin: Skin is warm and dry. No rash noted. No diaphoresis. No erythema. No pallor. Pscyh: Normal mood and affect. Behavior is normal. Judgment and thought content normal.   LABORATORY RESULTS: No results found for this or any previous visit (from the past 48 hour(s)).  RADIOLOGY RESULTS: No results found.  Problem List: Patient Active Problem List  Diagnosis  . HYPOTHYROIDISM  . HYPERTENSION  . SPRAIN&STRAIN OF UNSPECIFIED SITE OF HIP&THIGH  . BACK PAIN, LUMBAR  . Lapband 10 cm Feb 2005    Assessment & Plan: Obesity with weight loss failure with lapband.     Matt B.  Daphine Deutscher, MD, [facility] [phone] beeper [phone]  12/10/2011 5:22 PM

## 2011-12-10 NOTE — Patient Instructions (Addendum)
Folllow up with Jari Sportsman for scheduling tests for roux Y gastric bypass

## 2012-01-20 ENCOUNTER — Telehealth (INDEPENDENT_AMBULATORY_CARE_PROVIDER_SITE_OTHER): Payer: Self-pay | Admitting: General Surgery

## 2012-01-20 NOTE — Telephone Encounter (Signed)
Message copied by Littie Deeds on Tue Jan 20, 2012 12:46 PM ------      Message from: Lenise Herald      Created: Mon Jan 19, 2012  2:57 PM      Contact: [phone]       Patient requested you call her back. Said she is having revision from lap band to roux-en-y. Said she spoke with Florentina Addison, but wanted to talk to you. Did not indicate what questions if any she had.

## 2012-01-20 NOTE — Telephone Encounter (Signed)
Spoke with pt and informed her that I sent Dr. Ermalene Searing last office note on the patient around to [facility] our Bariatric coordinator and she explained that we were going to go ahead and submit this to the patient's insurance.  This was it will go ahead and open an account for that pt.  I did advise the patient to keep her November appt w/ Dr. Daphine Deutscher.  She seemed please with this.

## 2012-01-29 ENCOUNTER — Encounter: Payer: Self-pay | Admitting: Family Medicine

## 2012-02-16 ENCOUNTER — Other Ambulatory Visit (INDEPENDENT_AMBULATORY_CARE_PROVIDER_SITE_OTHER): Payer: 59

## 2012-02-16 DIAGNOSIS — E039 Hypothyroidism, unspecified: Secondary | ICD-10-CM

## 2012-02-16 LAB — TSH: TSH: 0.97 u[IU]/mL (ref 0.35–5.50)

## 2012-02-20 NOTE — Progress Notes (Signed)
Quick Note:  I spoke with pt ______ 

## 2012-02-24 ENCOUNTER — Telehealth: Payer: Self-pay | Admitting: Family Medicine

## 2012-02-24 MED ORDER — LEVOTHYROXINE SODIUM 125 MCG PO TABS: 125.0000 ug | ORAL_TABLET | Freq: Every day | ORAL | Status: DC

## 2012-02-24 NOTE — Telephone Encounter (Signed)
Refill request for Synthroid and send to [facility] ( 90 day supply ), which I did send e-scribe.

## 2012-02-26 ENCOUNTER — Encounter (INDEPENDENT_AMBULATORY_CARE_PROVIDER_SITE_OTHER): Payer: Self-pay | Admitting: Surgery

## 2012-02-26 ENCOUNTER — Ambulatory Visit (INDEPENDENT_AMBULATORY_CARE_PROVIDER_SITE_OTHER): Payer: 59 | Admitting: Surgery

## 2012-02-26 VITALS — BP 114/70 | HR 60 | Temp 97.6°F | Resp 16 | Ht 68.0 in | Wt 242.0 lb

## 2012-02-26 DIAGNOSIS — Z9884 Bariatric surgery status: Secondary | ICD-10-CM

## 2012-02-26 DIAGNOSIS — E669 Obesity, unspecified: Secondary | ICD-10-CM

## 2012-02-26 NOTE — Patient Instructions (Signed)
Follow-up before the end of the year

## 2012-02-26 NOTE — Addendum Note (Signed)
Addended byLiliana Cline on: 02/26/2012 10:36 AM   Modules accepted: Orders

## 2012-02-26 NOTE — Progress Notes (Signed)
Lisa Horne 36 y.o.  Body mass index is 36.80 kg/(m^2).  Patient Active Problem List  Diagnosis  . HYPOTHYROIDISM  . HYPERTENSION  . SPRAIN&STRAIN OF UNSPECIFIED SITE OF HIP&THIGH  . BACK PAIN, LUMBAR  . Lapband 10 cm Feb 2005  . Obesity    Allergies  Allergen Reactions  . Ciprofloxacin     REACTION: severe headache/vomiting  . Hydrochlorothiazide W-Triamterene     REACTION: malaise    Past Surgical History  Procedure Date  . Gastric restriction surgery     lap band 2005  . Gastric banding port revision    FRY,STEPHEN A, MD No diagnosis found.  [name] doesn't need   A fill today.  She is not hungry but her weight has plateaued.  She needs to see Huntley Dec for a dietary consult.  She is running 2 miles a day.   She wants to see me before the end of the year in case she needs a bandfill.   Matt B. Daphine Deutscher, MD, [facility] [phone] beeper [phone]  02/26/2012 10:31 AM

## 2012-03-09 ENCOUNTER — Encounter: Payer: Self-pay | Admitting: *Deleted

## 2012-03-09 ENCOUNTER — Encounter: Payer: 59 | Attending: Surgery | Admitting: *Deleted

## 2012-03-09 VITALS — Ht 68.0 in | Wt 242.5 lb

## 2012-03-09 DIAGNOSIS — Z09 Encounter for follow-up examination after completed treatment for conditions other than malignant neoplasm: Secondary | ICD-10-CM | POA: Insufficient documentation

## 2012-03-09 DIAGNOSIS — Z713 Dietary counseling and surveillance: Secondary | ICD-10-CM | POA: Insufficient documentation

## 2012-03-09 DIAGNOSIS — E669 Obesity, unspecified: Secondary | ICD-10-CM

## 2012-03-09 NOTE — Progress Notes (Addendum)
  Follow-up visit:  8 Years Post-Operative LAGB Surgery  Medical Nutrition Therapy:  Appt start time: 1030 end time:  1200.  Primary concerns today: Post-operative Bariatric Surgery Nutrition Management. Lisa Horne returns for f/u after a 1.5 yr hiatus. Last wt of 230.9 lbs noted on 08/01/10 at [facility]. Has increased exercise and participates in BELT program, though unable to lose weight.  Has maintained wt of ~240-245 lbs since 12/2010. Question if gain is d/t new muscle mass or inadequate energy intake? Will request Tanita data from BELT to compare.  Dietary recall and MFP data show intake of ~800 cal/day, though she admits she took a 1 month break from tracking foods and "eating the way I should".  Taking no supplements at this time. Discussed increased risk of deficiencies that could result. Agrees to resume. Will follow up in 4 weeks.    Wt Readings from Last 15 Encounters:  03/09/12 242 lb 8 oz (109.997 kg)  02/26/12 242 lb (109.77 kg)  12/10/11 246 lb (111.585 kg)  10/02/11 242 lb 2 oz (109.827 kg)  09/18/11 243 lb 12.8 oz (110.587 kg)  08/07/11 245 lb (111.131 kg)  07/02/11 237 lb 3.2 oz (107.593 kg)  06/27/11 240 lb (108.863 kg)  06/14/11 241 lb (109.317 kg)  06/13/11 241 lb 12.8 oz (109.68 kg)  05/23/11 239 lb (108.41 kg)  03/28/11 234 lb (106.142 kg)  02/07/11 235 lb 12.8 oz (106.958 kg)  01/31/11 234 lb 6.4 oz (106.323 kg)  01/17/11 230 lb 6.4 oz (104.509 kg)    Surgery date:  05/2003 Surgery type: LAGB Start weight at [facility]: 328.0 lbs (05/23/03)  Last weight at [facility]: 230.9 lbs (08/01/10)  Weight today: 242.5 lbs Weight change: + 11.6 lbs Total wt lost: 85.5 lbs  Goal weight: <200 lbs % goal weight: 66%  TANITA  BODY COMP RESULTS  02/26/12 03/09/12   Fat Mass (lbs) 115.5 119.0   Fat Free Mass (lbs) 124.0 123.5   Total Body Water (lbs) 91.0 90.5   24-hr recall: (My Fitness Pal avg for 12 days = ~800 cal/day) B (AM): Coffee w/ 2 T sugar free creamer Sn k (AM): I pkt maple  brown sugar oatmeal  L (PM): 1/2 Quest bar (coconut cashew) Snk (PM): 1/2 Quest bar or none  D (PM): 1 oz Fritos, 2 T plain greek yogurt, and 1-2 oz venison ("chili") Snk (PM): None  Fluid intake: 120-130 oz Estimated total protein intake: 45-60g  Medications:  See medication list Supplementation:  NONE at this time  Using straws: Yes Drinking while eating: Yes Hair loss: No Carbonated beverages: No N/V/D/C:  Consistent diarrhea reported Last Lap-Band fill:  08/07/11 - states she is currently in the green zone  Recent physical activity:  30 min/day, 6-7 days/week starting 03/08/12 ~ Runs 2 miles 4 days/week and participates in BELT 2 days/week.   Progress Towards Goal(s):  In progress.  Handouts given during visit include:  Bariatric Surgery Modified Post-Op Diet   Nutritional Diagnosis:  NI-1.4 Inadequate energy intake related to increased physical activity level as evidenced by patient consuming ~60-75% of energy needs.    Intervention:  Nutrition education.  Monitoring/Evaluation:  Dietary intake, exercise, lap band fills, and body weight. Follow up in 1 month for post-op visit.

## 2012-03-12 ENCOUNTER — Encounter: Payer: Self-pay | Admitting: *Deleted

## 2012-03-12 NOTE — Patient Instructions (Signed)
Goals:   TBD once Tanita data received from BELT  F/U in 4 weeks

## 2012-04-01 ENCOUNTER — Encounter (INDEPENDENT_AMBULATORY_CARE_PROVIDER_SITE_OTHER): Payer: Self-pay

## 2012-04-01 ENCOUNTER — Ambulatory Visit (INDEPENDENT_AMBULATORY_CARE_PROVIDER_SITE_OTHER): Payer: 59 | Admitting: Physician Assistant

## 2012-04-01 VITALS — BP 138/96 | HR 71 | Temp 97.9°F | Ht 69.0 in | Wt 248.2 lb

## 2012-04-01 DIAGNOSIS — Z4651 Encounter for fitting and adjustment of gastric lap band: Secondary | ICD-10-CM

## 2012-04-01 NOTE — Patient Instructions (Signed)
Take clear liquids tonight. Thin protein shakes are ok to start tomorrow morning. Slowly advance your diet thereafter. Call us if you have persistent vomiting or regurgitation, night cough or reflux symptoms. Return as scheduled or sooner if you notice no changes in hunger/portion sizes.  

## 2012-04-01 NOTE — Progress Notes (Signed)
  HISTORY: Lisa Horne is a 36 y.o.female who received a 10 cm lap-band in February 2005 by Dr. Daphine Deutscher. She comes in with complaints of hunger, larger portion sizes and weight gain. She is finished with the BELT program but has transferred to the [facility] program for physical activity. She continues to run on a regular basis. She has seen Huntley Dec in nutrition and is scheduled to see her again after Christmas. She is also scheduled to see Dr. Daphine Deutscher that day.  VITAL SIGNS: Filed Vitals:   04/01/12 1020  BP: 138/96  Pulse: 71  Temp: 97.9 F (36.6 C)    PHYSICAL EXAM: Physical exam reveals a very well-appearing 36 y.o.female in no apparent distress Neurologic: Awake, alert, oriented Psych: Bright affect, conversant Respiratory: Breathing even and unlabored. No stridor or wheezing Abdomen: Soft, nontender, nondistended to palpation. Incisions well-healed. No incisional hernias. Port easily palpated. Extremities: Atraumatic, good range of motion.  ASSESMENT: 36 y.o.  female  s/p 10 cm lap-band.   PLAN: The patient's port was accessed with a 20G Huber needle without difficulty. Clear fluid was aspirated and 0.25 mL saline was added to the port to give a total predicted volume of 2.3 mL. The patient was able to swallow water without difficulty following the procedure and was instructed to take clear liquids for the next 24-48 hours and advance slowly as tolerated.

## 2012-04-05 ENCOUNTER — Encounter: Payer: 59 | Attending: Surgery | Admitting: *Deleted

## 2012-04-05 ENCOUNTER — Encounter: Payer: Self-pay | Admitting: *Deleted

## 2012-04-05 DIAGNOSIS — Z713 Dietary counseling and surveillance: Secondary | ICD-10-CM | POA: Insufficient documentation

## 2012-04-05 DIAGNOSIS — Z09 Encounter for follow-up examination after completed treatment for conditions other than malignant neoplasm: Secondary | ICD-10-CM | POA: Insufficient documentation

## 2012-04-15 ENCOUNTER — Encounter: Payer: Self-pay | Admitting: *Deleted

## 2012-04-15 ENCOUNTER — Ambulatory Visit (INDEPENDENT_AMBULATORY_CARE_PROVIDER_SITE_OTHER): Payer: 59 | Admitting: Surgery

## 2012-04-15 ENCOUNTER — Encounter (INDEPENDENT_AMBULATORY_CARE_PROVIDER_SITE_OTHER): Payer: 59 | Admitting: Surgery

## 2012-04-15 VITALS — BP 136/72 | HR 71 | Temp 97.8°F | Resp 18 | Ht 68.0 in | Wt 243.0 lb

## 2012-04-15 DIAGNOSIS — Z9884 Bariatric surgery status: Secondary | ICD-10-CM

## 2012-04-15 NOTE — Patient Instructions (Signed)
Follow Sara's dietary instructions  

## 2012-04-15 NOTE — Progress Notes (Signed)
Lisa Horne dropped in for repeat Tanita scan today:   TANITA  BODY COMP RESULTS  02/26/12 03/09/12 04/05/12   Fat Mass (lbs) 115.5 119.0 119.5   Fat Free Mass (lbs) 124.0 123.5 126.0   Total Body Water (lbs) 91.0 90.5 92.0

## 2012-04-15 NOTE — Progress Notes (Signed)
Lisa Horne Body mass index is 36.95 kg/(m^2).  Having regurgitation:  yes  Nocturnal reflux?  yes  Amount of fill  -1 Lisa Horne feels about he is not getting enough to eat. She is probably too tight and we talked about taking some fluid out of her band. I removed half of the fluid in her 10 cm band. That leaves for a net 1 cc in the band. I plan to see her back in late March after she runs a Marathon.

## 2012-04-15 NOTE — Progress Notes (Signed)
Corrinne returns today for repeat Tanita scan:   TANITA  BODY COMP RESULTS  02/26/12 03/09/12 04/05/12 04/15/12   BMI (kg/m^2) 36.8 36.9 36.9 36.9   Fat Mass (lbs) 115.5 119.0 119.5 121.0   Fat Free Mass (lbs) 124.0 123.5 126.0 122.0   Total Body Water (lbs) 91.0 90.5 92.0 89.5

## 2012-06-02 ENCOUNTER — Encounter: Payer: Self-pay | Admitting: *Deleted

## 2012-06-04 NOTE — Progress Notes (Signed)
Lisa Horne returns today for repeat Tanita scan. She is up 15.5 lbs and very frustrated. Averages 900 cal/day and exercises 5-6 days/week (@ 1-1.5 hrs/day). My Fitness Pal shows the following recent dietary intake from 05/25/12:  1297 calories 159 g carbs (51%) 63 g protein (20%) 40 g fat (29%)  Recommendations: 1000 calories  100 g (MAX) carbs 80 g protein 35-40 g fat  TANITA  BODY COMP RESULTS  02/26/12 03/09/12 04/05/12 04/15/12 06/02/12   BMI (kg/m^2) 36.8 36.9 36.9 36.9 38.2   Fat Mass (lbs) 115.5 119.0 119.5 121.0 128.0   Fat Free Mass (lbs) 124.0 123.5 126.0 122.0 130.5   Total Body Water (lbs) 91.0 90.5 92.0 89.5 95.5

## 2012-06-24 ENCOUNTER — Ambulatory Visit (INDEPENDENT_AMBULATORY_CARE_PROVIDER_SITE_OTHER): Payer: 59 | Admitting: Physician Assistant

## 2012-06-24 ENCOUNTER — Encounter (INDEPENDENT_AMBULATORY_CARE_PROVIDER_SITE_OTHER): Payer: Self-pay

## 2012-06-24 VITALS — BP 120/80 | HR 76 | Resp 14 | Ht 69.0 in | Wt 261.6 lb

## 2012-06-24 DIAGNOSIS — Z4651 Encounter for fitting and adjustment of gastric lap band: Secondary | ICD-10-CM

## 2012-06-24 NOTE — Progress Notes (Signed)
  HISTORY: Lisa Horne is a 37 y.o.female who received an 10 cm lap-band in February 2005 by Dr. Daphine Deutscher. She comes in having 1 mL fluid removed in December in preparation for marathon training. She has gained 18 lbs in the interim however and she says she needs a fill. She is hungry, eating too much and gaining weight. No reflux or regurgitation.  VITAL SIGNS: Filed Vitals:   06/24/12 0953  BP: 120/80  Pulse: 76  Resp: 14    PHYSICAL EXAM: Physical exam reveals a very well-appearing 37 y.o.female in no apparent distress Neurologic: Awake, alert, oriented Psych: Bright affect, conversant Respiratory: Breathing even and unlabored. No stridor or wheezing Abdomen: Soft, nontender, nondistended to palpation. Incisions well-healed. No incisional hernias. Port easily palpated. Extremities: Atraumatic, good range of motion.  ASSESMENT: 37 y.o.  female  s/p 10 cm lap-band.   PLAN: The patient's port was accessed with a 20G Huber needle without difficulty. Clear fluid was aspirated and 1 mL saline was added to the port to give a total predicted volume of 2 mL. The patient was able to swallow water without difficulty following the procedure and was instructed to take clear liquids for the next 24-48 hours and advance slowly as tolerated.

## 2012-06-24 NOTE — Patient Instructions (Signed)
Take clear liquids tonight. Thin protein shakes are ok to start tomorrow morning. Slowly advance your diet thereafter. Call us if you have persistent vomiting or regurgitation, night cough or reflux symptoms. Return as scheduled or sooner if you notice no changes in hunger/portion sizes.  

## 2012-07-08 ENCOUNTER — Encounter (INDEPENDENT_AMBULATORY_CARE_PROVIDER_SITE_OTHER): Payer: 59

## 2012-07-23 ENCOUNTER — Ambulatory Visit (INDEPENDENT_AMBULATORY_CARE_PROVIDER_SITE_OTHER): Payer: 59 | Admitting: Family Medicine

## 2012-07-23 ENCOUNTER — Encounter: Payer: Self-pay | Admitting: Family Medicine

## 2012-07-23 VITALS — BP 128/84 | HR 91 | Temp 99.5°F | Wt 262.0 lb

## 2012-07-23 DIAGNOSIS — E039 Hypothyroidism, unspecified: Secondary | ICD-10-CM

## 2012-07-23 DIAGNOSIS — R5381 Other malaise: Secondary | ICD-10-CM

## 2012-07-23 DIAGNOSIS — G609 Hereditary and idiopathic neuropathy, unspecified: Secondary | ICD-10-CM

## 2012-07-23 DIAGNOSIS — R5383 Other fatigue: Secondary | ICD-10-CM

## 2012-07-23 LAB — HEPATIC FUNCTION PANEL
Albumin: 3.8 g/dL (ref 3.5–5.2)
Total Protein: 7.5 g/dL (ref 6.0–8.3)

## 2012-07-23 LAB — CBC WITH DIFFERENTIAL/PLATELET
Eosinophils Relative: 2.8 % (ref 0.0–5.0)
Lymphocytes Relative: 17.7 % (ref 12.0–46.0)
Monocytes Absolute: 0.7 10*3/uL (ref 0.1–1.0)
Monocytes Relative: 9.1 % (ref 3.0–12.0)
Neutrophils Relative %: 69.7 % (ref 43.0–77.0)
Platelets: 248 10*3/uL (ref 150.0–400.0)
WBC: 8.2 10*3/uL (ref 4.5–10.5)

## 2012-07-23 LAB — POCT URINALYSIS DIPSTICK
Glucose, UA: NEGATIVE
Nitrite, UA: NEGATIVE
Urobilinogen, UA: 0.2

## 2012-07-23 LAB — BASIC METABOLIC PANEL
CO2: 29 mEq/L (ref 19–32)
Calcium: 8.7 mg/dL (ref 8.4–10.5)
Creatinine, Ser: 0.9 mg/dL (ref 0.4–1.2)
GFR: 73.06 mL/min (ref >60.00)
Glucose, Bld: 106 mg/dL — ABNORMAL HIGH (ref 70–99)

## 2012-07-23 LAB — VITAMIN B12: Vitamin B-12: 419 pg/mL (ref 211–911)

## 2012-07-23 LAB — TSH: TSH: 6.45 u[IU]/mL — ABNORMAL HIGH (ref 0.35–5.50)

## 2012-07-23 NOTE — Progress Notes (Signed)
  Subjective:    Patient ID: Lisa Horne, female    DOB: 1976/03/10, 37 y.o.   MRN: [id number]  HPI Here for 6 weeks of intermittent fatigue, tingling in the arms and legs and face, headaches, dizziness, and some weight gain. No fever or URI symptoms. No bowel or urinary changes. He adjusted her Synthroid dose a few months ago.    Review of Systems  Constitutional: Positive for fatigue and unexpected weight change. Negative for activity change and appetite change.  Eyes: Negative.   Respiratory: Negative.   Cardiovascular: Negative.   Gastrointestinal: Negative.   Genitourinary: Negative.   Neurological: Positive for dizziness and headaches. Negative for tremors, seizures, syncope, facial asymmetry, speech difficulty, weakness, light-headedness and numbness.  Hematological: Negative.        Objective:   Physical Exam  Constitutional: She is oriented to person, place, and time. She appears well-developed and well-nourished.  HENT:  Right Ear: External ear normal.  Left Ear: External ear normal.  Nose: Nose normal.  Mouth/Throat: Oropharynx is clear and moist.  Eyes: Conjunctivae are normal. Pupils are equal, round, and reactive to light.  Neck: Neck supple. No thyromegaly present.  Cardiovascular: Normal rate, regular rhythm, normal heart sounds and intact distal pulses.   Pulmonary/Chest: Effort normal and breath sounds normal.  Lymphadenopathy:    She has no cervical adenopathy.  Neurological: She is alert and oriented to person, place, and time. She has normal reflexes. No cranial nerve deficit. She exhibits normal muscle tone.          Assessment & Plan:  It is unclear what the etiology of these symptoms would be. Get labs today including a TSH.

## 2012-07-27 NOTE — Progress Notes (Signed)
Quick Note:  I left voice message for pt to return my call, also need to know if this is a mail order or local pharmacy? ______

## 2012-07-29 ENCOUNTER — Telehealth: Payer: Self-pay | Admitting: Family Medicine

## 2012-07-29 MED ORDER — LEVOTHYROXINE SODIUM 150 MCG PO TABS: 150.0000 ug | ORAL_TABLET | Freq: Every day | ORAL | Status: DC

## 2012-07-29 NOTE — Telephone Encounter (Signed)
Send new script for Synthroid 1 month to local and then mail order.

## 2012-07-29 NOTE — Telephone Encounter (Signed)
I sent in 90 day supply to mail order and also called in a 30 day script locally, until the mail one arrives. I did speak with pt about this.

## 2012-09-20 ENCOUNTER — Ambulatory Visit (INDEPENDENT_AMBULATORY_CARE_PROVIDER_SITE_OTHER): Payer: 59 | Admitting: Family Medicine

## 2012-09-20 ENCOUNTER — Encounter: Payer: Self-pay | Admitting: Family Medicine

## 2012-09-20 VITALS — BP 120/80 | HR 117 | Temp 99.7°F | Wt 258.0 lb

## 2012-09-20 DIAGNOSIS — M722 Plantar fascial fibromatosis: Secondary | ICD-10-CM

## 2012-09-20 NOTE — Progress Notes (Signed)
  Subjective:    Patient ID: Lisa Horne, female    DOB: 03/24/1976, 37 y.o.   MRN: [id number]  HPI Here for one month of pain in the bottom of the left foot. She is a runner and is training for a marathon this December. Using ice and Motrin.    Review of Systems  Constitutional: Negative.   Musculoskeletal: Positive for arthralgias.       Objective:   Physical Exam  Constitutional:  Walks with a limp   Musculoskeletal:  Tender over the left arch and heel, no edema           Assessment & Plan:  Refer to Orthopedics.

## 2012-09-27 ENCOUNTER — Encounter: Payer: Self-pay | Admitting: Family Medicine

## 2012-10-20 ENCOUNTER — Encounter (INDEPENDENT_AMBULATORY_CARE_PROVIDER_SITE_OTHER): Payer: Self-pay | Admitting: Surgery

## 2012-10-20 ENCOUNTER — Ambulatory Visit (INDEPENDENT_AMBULATORY_CARE_PROVIDER_SITE_OTHER): Payer: 59 | Admitting: Surgery

## 2012-10-20 VITALS — BP 138/86 | HR 74 | Temp 98.3°F | Resp 16 | Ht 68.0 in | Wt 260.4 lb

## 2012-10-20 DIAGNOSIS — Z4651 Encounter for fitting and adjustment of gastric lap band: Secondary | ICD-10-CM

## 2012-10-20 DIAGNOSIS — Z9884 Bariatric surgery status: Secondary | ICD-10-CM

## 2012-10-20 NOTE — Progress Notes (Signed)
Lisa Horne is training for a Marathon. When she runs she is experiencing some obstruction. I think that her 10 cm band was too tight and she may even have developed a slip. I have given her band vacation and removed 2.5 cc from her band today. She has been contemplating revision needed to bypass or sleeve. I'll see her back in 6 weeks and see she's doing with her diet and her training.

## 2012-10-20 NOTE — Patient Instructions (Addendum)

## 2012-11-11 ENCOUNTER — Encounter (HOSPITAL_COMMUNITY): Payer: Self-pay | Admitting: *Deleted

## 2012-11-11 ENCOUNTER — Emergency Department (INDEPENDENT_AMBULATORY_CARE_PROVIDER_SITE_OTHER)
Admission: EM | Admit: 2012-11-11 | Discharge: 2012-11-11 | Disposition: A | Discharge status: Home / Self Care | Payer: 59 | Source: Home / Self Care | Attending: Family Medicine | Admitting: Family Medicine

## 2012-11-11 DIAGNOSIS — L02419 Cutaneous abscess of limb, unspecified: Secondary | ICD-10-CM

## 2012-11-11 DIAGNOSIS — L03119 Cellulitis of unspecified part of limb: Secondary | ICD-10-CM

## 2012-11-11 DIAGNOSIS — L03116 Cellulitis of left lower limb: Secondary | ICD-10-CM

## 2012-11-11 MED ORDER — CEFTRIAXONE SODIUM 1 G IJ SOLR
1.0000 g | Freq: Once | INTRAMUSCULAR | Status: AC
  Administered 2012-11-11: 1 g via INTRAMUSCULAR

## 2012-11-11 MED ORDER — DOXYCYCLINE HYCLATE 100 MG PO CAPS: 100.0000 mg | ORAL_CAPSULE | Freq: Two times a day (BID) | ORAL | Status: DC

## 2012-11-11 MED ORDER — CEFTRIAXONE SODIUM 1 G IJ SOLR
INTRAMUSCULAR | Status: AC
  Filled 2012-11-11: qty 10

## 2012-11-11 MED ORDER — IBUPROFEN 600 MG PO TABS: 600.0000 mg | ORAL_TABLET | Freq: Three times a day (TID) | ORAL | Status: DC | PRN

## 2012-11-11 MED ORDER — LIDOCAINE HCL (PF) 1 % IJ SOLN
INTRAMUSCULAR | Status: AC
  Filled 2012-11-11: qty 5

## 2012-11-11 NOTE — ED Notes (Signed)
Noted red round spot 1 " diameter to  L upper thigh onset Monday.   It uncomfortable. No pain or itching.  She thought it might be a tick bite and tried to get it out with a tweezers last night.  Redness progressively bigger since Monday.  Marked the circle at [facility] and it has spread since then.

## 2012-11-11 NOTE — ED Provider Notes (Signed)
CSN: [id number]     Arrival date & time 11/11/12  1834 History     First MD Initiated Contact with Patient 11/11/12 1904     Chief Complaint  Patient presents with  . Cellulitis   (Consider location/radiation/quality/duration/timing/severity/associated sxs/prior Treatment) HPI Comments: 37 year old nondiabetic female with history of obesity, polycystic ovarian syndrome and thyroid disease. Here complaining of an area of redness, tenderness and swelling in her left anterior thigh. Patient noticed a small patch about 1 inch diameter of redness and tenderness with the central pustule 3 days ago. Redness, tenderness and induration has increased. No spontaneous drainage. Denies fever or chills. She has been taking Benadryl with no improvement. Has been using warm compresses as well.    Past Medical History  Diagnosis Date  . Thyroid disease     hypothyroidism  . Morbid obesity   . PCOS (polycystic ovarian syndrome)    Past Surgical History  Procedure Laterality Date  . Gastric restriction surgery      lap band 2005  . Gastric banding port revision     Family History  Problem Relation Age of Onset  . Diabetes Father   . Cancer Father     skin  . Hypertension Mother   . Cancer Mother     breast  . Depression Mother   . Depression Sister    History  Substance Use Topics  . Smoking status: Never Smoker   . Smokeless tobacco: Never Used  . Alcohol Use: No   OB History   Grav Para Term Preterm Abortions TAB SAB Ect Mult Living                 Review of Systems  Constitutional: Negative for fever, chills, diaphoresis and appetite change.  Gastrointestinal: Negative for nausea and vomiting.  Endocrine: Negative for polydipsia, polyphagia and polyuria.  Skin: Positive for rash.  Neurological: Negative for dizziness and headaches.  All other systems reviewed and are negative.    Allergies  Ciprofloxacin and Hydrochlorothiazide w-triamterene  Home Medications   Current  Outpatient Rx  Name  Route  Sig  Dispense  Refill  . levothyroxine (SYNTHROID, LEVOTHROID) 150 MCG tablet   Oral   Take 1 tablet (150 mcg total) by mouth daily.   90 tablet   3   . Multiple Vitamin (MULTIVITAMIN) capsule   Oral   Take 1 capsule by mouth daily.         Marland Kitchen doxycycline (VIBRAMYCIN) 100 MG capsule   Oral   Take 1 capsule (100 mg total) by mouth 2 (two) times daily.   20 capsule   0   . ibuprofen (ADVIL,MOTRIN) 600 MG tablet   Oral   Take 1 tablet (600 mg total) by mouth every 8 (eight) hours as needed for pain. Take with food as it can upset the stomach   30 tablet   0    BP 134/50  Pulse 86  Temp(Src) 98.3 F (36.8 C) (Oral)  Resp 16  SpO2 100%  LMP 08/12/2012 Physical Exam  Nursing note and vitals reviewed. Constitutional: She is oriented to person, place, and time. She appears well-developed and well-nourished. No distress.  Cardiovascular: Normal heart sounds.   Pulmonary/Chest: Breath sounds normal.  Neurological: She is alert and oriented to person, place, and time.  Skin: Rash noted. She is not diaphoretic.  There is round well demarcated area of about 13 cm diameter with redness skin thickening, increased temp and tenderness to palpation in left anterior upper thigh.  There is a central ulcer with amber color exudate on top. No purulent drainage. No fluctuations. Redness and induration was marked with dermographic pen. No striking erythema.     ED Course   Procedures (including critical care time)  Labs Reviewed - No data to display No results found. 1. Cellulitis of left thigh     MDM  No areas of fluctuation. Patient declined needle aspiration for possible localized and deep abscess. Favored initiating medical treatment and close followup. Administered Rocephin 1 g IM x1 here. Prescribed doxycycline and ibuprofen. Asked return in 48 hours for followup. Cellulitis area was marked with a demographic pen. Supportive care and red flags that  should prompt return to medical attention discussed with patient and provided in writing.  Sharin Grave, MD 11/11/12 2026

## 2012-11-24 ENCOUNTER — Ambulatory Visit (INDEPENDENT_AMBULATORY_CARE_PROVIDER_SITE_OTHER): Payer: 59 | Admitting: Surgery

## 2012-11-24 ENCOUNTER — Encounter (INDEPENDENT_AMBULATORY_CARE_PROVIDER_SITE_OTHER): Payer: Self-pay | Admitting: Surgery

## 2012-11-24 VITALS — BP 138/90 | HR 72 | Temp 98.7°F | Resp 16 | Ht 68.0 in | Wt 277.2 lb

## 2012-11-24 DIAGNOSIS — Z9884 Bariatric surgery status: Secondary | ICD-10-CM

## 2012-11-24 NOTE — Patient Instructions (Signed)
Gastric Bypass Surgery Care After Refer to this sheet in the next few weeks. These discharge instructions provide you with general information on caring for yourself after you leave the hospital. Your caregiver may also give you specific instructions. Your treatment has been planned according to the most current medical practices available, but unavoidable complications sometimes occur. If you have any problems or questions after discharge, call your caregiver. HOME CARE INSTRUCTIONS  Activity  Take frequent walks throughout the day. This will help to prevent blood clots. Do not sit for longer than 45 minutes to 1 hour while awake for 4 to 6 weeks after surgery.  Continue to do coughing and deep breathing exercises once you get home. This will help to prevent pneumonia.  Do not do strenuous activities, such as heavy lifting, pushing, or pulling, until after your follow-up visit with your caregiver. Do not lift anything heavier than 10 lb (4.5 kg).  Talk with your caregiver about when you may return to work and your exercise routine.  Do not drive while taking prescription pain medicine. Nutrition  It is very important that you drink at least 80 oz (2,400 mL) of fluid a day.  You should stay on a clear liquid diet until your follow-up visit with your caregiver. Keep sugar-free, clear liquid items on hand, including:  Tea: hot or cold. Drink only decaffeinated for the first month.  Broths: clear beef, chicken, vegetable.  Others: water, sugar-free frozen ice pops, flavored water, gelatin (after 1 week).  Do not consume caffeine for 1 month. Large amounts of caffeine can cause dehydration.  A dietician may also give you specific instructions.  Follow your caregiver's recommendations about vitamins and protein requirements after surgery. Hygiene  You may shower and wash your hair 2 days after surgery. Pat incisions dry. Do not rub incisions with a washcloth or towel.  Follow your  caregiver's recommendations about baths and pools following surgery. Pain control  If a prescription medicine was given, follow your caregiver's directions.  You may feel some gas pain caused by the carbon dioxide used to inflate your abdomen during surgery. This pain can be felt in your chest, shoulder, back, or abdominal area. Moving around often is advised. Incision care  You may have 4 or more small incisions. They are closed with skin adhesive strips. Skin adhesive strips can get wet and will fall off on their own. Check your incisions and surrounding area daily for any redness, swelling, discoloration, fluid (drainage), or bleeding. Dark red, dried blood may appear under these coverings. This is normal.  If you have a drain, it will be removed at your follow-up visit or before you leave the hospital.  If your drain is left in, follow your caregiver's instructions on drain care.  If your drain is taken out, keep a clean, dry bandage over the drain site. SEEK MEDICAL CARE IF:   You develop persistent nausea and vomiting.  You have pain and discomfort with swallowing.  You have pain, swelling, or warmth in the lower extremities.  You have an oral temperature above 102 F (38.9 C).  You develop chills.  Your incision sites look red, swollen, or have drainage.  Your stool is black, tarry, or maroon in color.  You are lightheaded when standing.  You notice a bruise getting larger.  You have any questions or concerns. SEEK IMMEDIATE MEDICAL CARE IF:   You have chest pain.  You have severe calf pain or pain not relieved by medicine.  You   develop shortness of breath or difficulty breathing.  There is bright red blood coming from the drain.  You feel confused.  You have slurred speech.  You suddenly feel weak. MAKE SURE YOU:   Understand these instructions.  Will watch your condition.  Will get help right away if you are not doing well or get worse. Document  Released: 11/20/2003 Document Revised: 06/30/2011 Document Reviewed: 08/28/2009 ExitCare Patient Information 2014 [employer].  

## 2012-11-24 NOTE — Progress Notes (Signed)
Lisa Horne 37 y.o.  Body mass index is 42.16 kg/(m^2).  Patient Active Problem List   Diagnosis Date Noted  . Obesity 12/10/2011  . Lapband 10 cm Feb 2005 06/13/2011  . BACK PAIN, LUMBAR 04/30/2010  . SPRAIN&STRAIN OF UNSPECIFIED SITE OF HIP&THIGH 11/09/2009  . HYPOTHYROIDISM 12/02/2006  . HYPERTENSION 12/02/2006    Allergies  Allergen Reactions  . Ciprofloxacin     REACTION: severe headache/vomiting  . Hydrochlorothiazide W-Triamterene     REACTION: malaise    Past Surgical History  Procedure Laterality Date  . Gastric restriction surgery      lap band 2005  . Gastric banding port revision     FRY,STEPHEN A, MD No diagnosis found.  [name] came in today and is having regurgitation when she runs. She's been unable to train for her  Marathon. In addition she is preparing her 73 year old son who is: To [facility] later to have a revision of his urostomy.  We have gone over options to remove her band and consider either a sleeve gastrectomy or a Roux-en-Y gastric bypass. I think she would prefer the latter and I agree. She has had weight regain with and and although less than her preop weight we are unable to aggressively manage her with restriction because of the problems with regurgitation.  I will go ahead and see if we can move forward with assessing her insurance for conversion from a lap band to Roux-en-Y gastric bypass. Matt B. Daphine Deutscher, MD, [facility] [phone] beeper [phone]  11/24/2012 4:12 PM

## 2012-12-03 ENCOUNTER — Encounter (INDEPENDENT_AMBULATORY_CARE_PROVIDER_SITE_OTHER): Payer: 59 | Admitting: Surgery

## 2012-12-09 ENCOUNTER — Encounter (INDEPENDENT_AMBULATORY_CARE_PROVIDER_SITE_OTHER): Payer: Self-pay | Admitting: Surgery

## 2012-12-15 ENCOUNTER — Other Ambulatory Visit: Payer: Self-pay | Admitting: *Deleted

## 2012-12-16 ENCOUNTER — Ambulatory Visit (INDEPENDENT_AMBULATORY_CARE_PROVIDER_SITE_OTHER): Payer: 59 | Admitting: Family Medicine

## 2012-12-16 ENCOUNTER — Encounter: Payer: Self-pay | Admitting: Family Medicine

## 2012-12-16 VITALS — BP 124/80 | HR 73 | Temp 98.4°F | Wt 280.0 lb

## 2012-12-16 DIAGNOSIS — M940 Chondrocostal junction syndrome [Tietze]: Secondary | ICD-10-CM

## 2012-12-16 DIAGNOSIS — E039 Hypothyroidism, unspecified: Secondary | ICD-10-CM

## 2012-12-16 NOTE — Progress Notes (Signed)
  Subjective:    Patient ID: Lisa Horne, female    DOB: 1975/05/01, 37 y.o.   MRN: [id number]  HPI Here for several things. First she has been feeling tired with no energy. We adjusted her Synthroid dose in June but she has not been in to have her levels checked yet. Also she has had several weeks of intermittent sharp pains in the right chest area. No SOB or nausea. She has not done any running for the past 2 months but she has been doing some water aerobics.    Review of Systems  Constitutional: Positive for fatigue.  Respiratory: Negative.   Cardiovascular: Positive for chest pain. Negative for palpitations and leg swelling.       Objective:   Physical Exam  Constitutional: She appears well-developed and well-nourished.  Neck: No thyromegaly present.  Cardiovascular: Normal rate, regular rhythm, normal heart sounds and intact distal pulses.   Pulmonary/Chest: Effort normal and breath sounds normal.  Tender along the right sternal border   Lymphadenopathy:    She has no cervical adenopathy.          Assessment & Plan:  She has costochondritis so I advised her to rest. She can use Ibuprofen prn. We will check a thyroid panel.

## 2012-12-17 ENCOUNTER — Other Ambulatory Visit (INDEPENDENT_AMBULATORY_CARE_PROVIDER_SITE_OTHER): Payer: Self-pay

## 2012-12-17 LAB — T4, FREE: Free T4: 0.88 ng/dL (ref 0.60–1.60)

## 2012-12-17 LAB — TSH: TSH: 3.5 u[IU]/mL (ref 0.35–5.50)

## 2012-12-21 ENCOUNTER — Ambulatory Visit: Payer: 59 | Admitting: Family Medicine

## 2012-12-21 ENCOUNTER — Other Ambulatory Visit: Payer: Self-pay | Admitting: *Deleted

## 2012-12-21 NOTE — Progress Notes (Signed)
Quick Note:  I left voice message with results. ______ 

## 2012-12-22 ENCOUNTER — Other Ambulatory Visit: Payer: Self-pay | Admitting: *Deleted

## 2012-12-22 MED ORDER — IBUPROFEN 600 MG PO TABS: 600.0000 mg | ORAL_TABLET | Freq: Three times a day (TID) | ORAL | Status: DC | PRN

## 2013-01-10 ENCOUNTER — Other Ambulatory Visit: Payer: Self-pay

## 2013-01-10 ENCOUNTER — Ambulatory Visit (HOSPITAL_COMMUNITY)
Admission: RE | Admit: 2013-01-10 | Discharge: 2013-01-10 | Disposition: A | Discharge status: Home / Self Care | Payer: 59 | Source: Ambulatory Visit | Attending: Surgery | Admitting: Surgery

## 2013-01-10 DIAGNOSIS — Z9884 Bariatric surgery status: Secondary | ICD-10-CM | POA: Insufficient documentation

## 2013-01-10 DIAGNOSIS — I10 Essential (primary) hypertension: Secondary | ICD-10-CM | POA: Insufficient documentation

## 2013-01-10 DIAGNOSIS — K3189 Other diseases of stomach and duodenum: Secondary | ICD-10-CM | POA: Insufficient documentation

## 2013-01-10 DIAGNOSIS — Z6841 Body Mass Index (BMI) 40.0 and over, adult: Secondary | ICD-10-CM | POA: Insufficient documentation

## 2013-01-10 DIAGNOSIS — Z1231 Encounter for screening mammogram for malignant neoplasm of breast: Secondary | ICD-10-CM | POA: Insufficient documentation

## 2013-01-10 DIAGNOSIS — E039 Hypothyroidism, unspecified: Secondary | ICD-10-CM | POA: Insufficient documentation

## 2013-01-11 ENCOUNTER — Encounter: Payer: Self-pay | Admitting: Family Medicine

## 2013-01-13 ENCOUNTER — Other Ambulatory Visit (INDEPENDENT_AMBULATORY_CARE_PROVIDER_SITE_OTHER): Payer: Self-pay | Admitting: Surgery

## 2013-01-13 DIAGNOSIS — R928 Other abnormal and inconclusive findings on diagnostic imaging of breast: Secondary | ICD-10-CM

## 2013-01-18 ENCOUNTER — Ambulatory Visit
Admission: RE | Admit: 2013-01-18 | Discharge: 2013-01-18 | Disposition: A | Discharge status: Home / Self Care | Payer: 59 | Source: Ambulatory Visit | Attending: Surgery | Admitting: Surgery

## 2013-01-18 DIAGNOSIS — R928 Other abnormal and inconclusive findings on diagnostic imaging of breast: Secondary | ICD-10-CM

## 2013-03-15 ENCOUNTER — Encounter: Payer: Self-pay | Admitting: Family Medicine

## 2013-03-15 ENCOUNTER — Ambulatory Visit (INDEPENDENT_AMBULATORY_CARE_PROVIDER_SITE_OTHER): Payer: 59 | Admitting: Family Medicine

## 2013-03-15 VITALS — BP 126/78 | HR 83 | Temp 98.4°F | Wt 302.0 lb

## 2013-03-15 DIAGNOSIS — R7309 Other abnormal glucose: Secondary | ICD-10-CM

## 2013-03-15 DIAGNOSIS — R739 Hyperglycemia, unspecified: Secondary | ICD-10-CM

## 2013-03-15 DIAGNOSIS — M940 Chondrocostal junction syndrome [Tietze]: Secondary | ICD-10-CM

## 2013-03-15 DIAGNOSIS — G609 Hereditary and idiopathic neuropathy, unspecified: Secondary | ICD-10-CM

## 2013-03-15 LAB — CBC WITH DIFFERENTIAL/PLATELET
Basophils Absolute: 0.1 10*3/uL (ref 0.0–0.1)
Eosinophils Absolute: 0.2 10*3/uL (ref 0.0–0.7)
Eosinophils Relative: 2 % (ref 0–5)
HCT: 39 % (ref 36.0–46.0)
Hemoglobin: 13.4 g/dL (ref 12.0–15.0)
Lymphs Abs: 1.8 10*3/uL (ref 0.7–4.0)
MCH: 28.8 pg (ref 26.0–34.0)
MCHC: 34.4 g/dL (ref 30.0–36.0)
MCV: 83.9 fL (ref 78.0–100.0)
Monocytes Absolute: 0.9 10*3/uL (ref 0.1–1.0)
Monocytes Relative: 11 % (ref 3–12)
Neutrophils Relative %: 63 % (ref 43–77)
Platelets: 320 10*3/uL (ref 150–400)
RBC: 4.65 MIL/uL (ref 3.87–5.11)

## 2013-03-15 LAB — POCT URINALYSIS DIPSTICK
Bilirubin, UA: NEGATIVE
Glucose, UA: NEGATIVE
Ketones, UA: NEGATIVE
Nitrite, UA: NEGATIVE
Protein, UA: NEGATIVE
Spec Grav, UA: 1.02
Urobilinogen, UA: 0.2
pH, UA: 7.5

## 2013-03-15 MED ORDER — CYCLOBENZAPRINE HCL 10 MG PO TABS: 10.0000 mg | ORAL_TABLET | Freq: Three times a day (TID) | ORAL | Status: DC | PRN

## 2013-03-15 MED ORDER — DICLOFENAC SODIUM 75 MG PO TBEC: 75.0000 mg | DELAYED_RELEASE_TABLET | Freq: Two times a day (BID) | ORAL | Status: DC

## 2013-03-15 MED ORDER — OMEPRAZOLE 20 MG PO CPDR: 20.0000 mg | DELAYED_RELEASE_CAPSULE | Freq: Every day | ORAL | Status: DC

## 2013-03-15 NOTE — Progress Notes (Signed)
Pre visit review using our clinic review tool, if applicable. No additional management support is needed unless otherwise documented below in the visit note. 

## 2013-03-15 NOTE — Progress Notes (Signed)
  Subjective:    Patient ID: Lisa Horne, female    DOB: 07-28-1975, 37 y.o.   MRN: [id number]  HPI Here for several things. Her costochondritis is still bothering her on a daily basis with sharp right sided chest pains. These improve a lot when she takes Ibuprofen but this upsets her stomach. Also for one week she has had tingling in her right cheek. No pain or weakness. She is fasting for labs.    Review of Systems  Constitutional: Negative.   Respiratory: Negative.   Cardiovascular: Positive for chest pain. Negative for palpitations and leg swelling.  Neurological: Negative.        Objective:   Physical Exam  Constitutional: She is oriented to person, place, and time. She appears well-developed and well-nourished.  Cardiovascular: Normal rate, regular rhythm, normal heart sounds and intact distal pulses.   Pulmonary/Chest: Effort normal and breath sounds normal.  Neurological: She is alert and oriented to person, place, and time. She has normal reflexes. No cranial nerve deficit. She exhibits normal muscle tone. Coordination normal.          Assessment & Plan:  Try Diclofenac for the costochondritis but add Prilosec OTC daily to protect the GI tract. She may have a viral infection in the trigeminal nerve. Get labs today

## 2013-03-15 NOTE — Addendum Note (Signed)
Addended by: Bonnye Fava on: 03/15/2013 11:22 AM   Modules accepted: Orders

## 2013-03-16 LAB — HEPATIC FUNCTION PANEL
ALT: 46 U/L — ABNORMAL HIGH (ref 0–35)
AST: 33 U/L (ref 0–37)
Albumin: 4 g/dL (ref 3.5–5.2)
Alkaline Phosphatase: 69 U/L (ref 39–117)
Total Bilirubin: 0.5 mg/dL (ref 0.3–1.2)

## 2013-03-16 LAB — HEMOGLOBIN A1C
Hgb A1c MFr Bld: 5.8 % — ABNORMAL HIGH (ref <5.7)
Mean Plasma Glucose: 120 mg/dL — ABNORMAL HIGH (ref <117)

## 2013-03-16 LAB — LIPID PANEL
Cholesterol: 209 mg/dL — ABNORMAL HIGH (ref 0–200)
LDL Cholesterol: 133 mg/dL — ABNORMAL HIGH (ref 0–99)
Total CHOL/HDL Ratio: 5 Ratio
VLDL: 34 mg/dL (ref 0–40)

## 2013-03-16 LAB — BASIC METABOLIC PANEL
CO2: 28 mEq/L (ref 19–32)
Chloride: 103 mEq/L (ref 96–112)
Potassium: 4.9 mEq/L (ref 3.5–5.3)
Sodium: 139 mEq/L (ref 135–145)

## 2013-03-24 ENCOUNTER — Ambulatory Visit (INDEPENDENT_AMBULATORY_CARE_PROVIDER_SITE_OTHER): Payer: 59 | Admitting: Physician Assistant

## 2013-03-24 ENCOUNTER — Encounter (INDEPENDENT_AMBULATORY_CARE_PROVIDER_SITE_OTHER): Payer: Self-pay

## 2013-03-24 VITALS — BP 122/78 | HR 80 | Resp 18 | Ht 69.0 in | Wt 303.2 lb

## 2013-03-24 DIAGNOSIS — Z4651 Encounter for fitting and adjustment of gastric lap band: Secondary | ICD-10-CM

## 2013-03-24 NOTE — Patient Instructions (Signed)

## 2013-03-24 NOTE — Progress Notes (Signed)
  HISTORY: Lisa Horne is a 37 y.o.female who received an 10cm lap-band in February 2005 by Dr. Daphine Deutscher. She comes in with 26 lbs weight gain since her last visit with Dr. Daphine Deutscher in August. She has no fluid at all in her band. She is just 18 lbs shy of her pre-op weight. She is currently in the process of obtaining approval for conversion to a Roux-en-Y gastric bypass. Meantime, she would like to get some of the weight off as her A1C and cholesterol are slowly creeping up per her report.  VITAL SIGNS: Filed Vitals:   03/24/13 1041  BP: 122/78  Pulse: 80  Resp: 18    PHYSICAL EXAM: Physical exam reveals a very well-appearing 37 y.o.female in no apparent distress Neurologic: Awake, alert, oriented Psych: Bright affect, conversant Respiratory: Breathing even and unlabored. No stridor or wheezing Abdomen: Soft, nontender, nondistended to palpation. Incisions well-healed. No incisional hernias. Port easily palpated. Extremities: Atraumatic, good range of motion.  ASSESMENT: 37 y.o.  female  s/p 10cm lap-band.   PLAN: The patient's port was accessed with a 20G Huber needle without difficulty. Clear fluid was aspirated and 2 mL saline was added to the port to give a total predicted volume of 2 mL. The patient was able to swallow water without difficulty following the procedure and was instructed to take clear liquids for the next 24-48 hours and advance slowly as tolerated.

## 2013-03-28 ENCOUNTER — Encounter: Payer: 59 | Attending: Surgery | Admitting: Dietician

## 2013-03-28 VITALS — Ht 68.0 in | Wt 300.0 lb

## 2013-03-28 DIAGNOSIS — Z713 Dietary counseling and surveillance: Secondary | ICD-10-CM | POA: Insufficient documentation

## 2013-03-28 DIAGNOSIS — E669 Obesity, unspecified: Secondary | ICD-10-CM | POA: Insufficient documentation

## 2013-03-28 NOTE — Patient Instructions (Signed)
Goals:  After you feel less tight (about 7 days after last fill), follow Pre-Op Diet for two weeks, then:   Follow Phase 3B: High Protein + Non-Starchy Vegetables  Eat 3-6 small meals/snacks, every 3-5 hrs  Increase lean protein foods to meet 60g goal  Increase fluid intake to 64oz +  Add 15 grams of carbohydrate (fruit, whole grain, starchy vegetable) with meals  Avoid drinking 15 minutes before, during and 30 minutes after eating  Aim for >30 min of physical activity daily

## 2013-03-28 NOTE — Progress Notes (Signed)
  Medical Nutrition Therapy:  Appt start time: 1645 end time:  1715.   Assessment:  Primary concerns today: Lisa Horne is here today for a follow up for a lap band refresher. Had an appointment with [name] at [facility] last week and had a 2 cc fill. States she has been "snug" since Friday morning.    Had a lap band almost 10 years ago and got to around 222 lbs - 230 lbs. Had a pregnancy 5 years ago and was 235 lbs post-partum. Had plantar fasciitis last year while training for a half marathon. Had all fluid removed from band at this time and gained weight really quickly (70 lbs). Is considering having a revision to a RYGB.  Has trouble eating in the morning and can eat more as the day goes on.   Preferred Learning Style:   No preference indicated   Learning Readiness:   Ready  MEDICATIONS: See List   DIETARY INTAKE:  24-hr recall:  2 protein shakes (Nectar or Isopure or Premier in water or almond milk), 32 oz coffee with sweet and low and powdered creamer, and 50 oz water most of the day.  Having 1 cup pork BBQ at dinner time.   Usual physical activity: none right now   Progress Towards Goal(s):  In progress.   Nutritional Diagnosis:  Clover-3.3 Overweight/obesity related to past poor dietary habits and physical inactivity as evidenced by patient w/hx of LAGB surgery following dietary guidelines for continued weight loss.    Intervention:  Nutrition education/counseling provided.  Teaching Method Utilized:  Visual Auditory  Handouts given during visit include:  Pre-Op Diet  Phase 3B High Protein + NS Vegetables  Protein List from Phase 3A High Protein Diet   Barriers to learning/adherence to lifestyle change: hx of problems getting in sweet spot with lap band  Demonstrated degree of understanding via:  Teach Back   Monitoring/Evaluation:  Dietary intake, exercise, and body weight prn.   TANITA  BODY COMP RESULTS  02/26/12 03/09/12 04/05/12 04/15/12 06/02/12 12/814   BMI (kg/m^2)  36.8 36.9 36.9 36.9 38.2 45.6   Fat Mass (lbs) 115.5 119.0 119.5 121.0 128.0 164.0   Fat Free Mass (lbs) 124.0 123.5 126.0 122.0 130.5 136.0   Total Body Water (lbs) 91.0 90.5 92.0 89.5 95.5 99.5

## 2013-04-07 ENCOUNTER — Encounter (INDEPENDENT_AMBULATORY_CARE_PROVIDER_SITE_OTHER): Payer: Self-pay | Admitting: Surgery

## 2013-04-18 ENCOUNTER — Ambulatory Visit: Payer: 59 | Admitting: Dietician

## 2013-04-18 ENCOUNTER — Ambulatory Visit: Payer: Self-pay | Admitting: Specialist

## 2013-04-21 ENCOUNTER — Ambulatory Visit: Payer: Self-pay | Admitting: Specialist

## 2013-05-25 ENCOUNTER — Encounter (INDEPENDENT_AMBULATORY_CARE_PROVIDER_SITE_OTHER): Payer: Self-pay | Admitting: Surgery

## 2013-05-25 ENCOUNTER — Ambulatory Visit (INDEPENDENT_AMBULATORY_CARE_PROVIDER_SITE_OTHER): Payer: Private Health Insurance - Indemnity | Admitting: Surgery

## 2013-05-25 VITALS — BP 122/86 | HR 68 | Resp 14 | Ht 69.0 in | Wt 310.8 lb

## 2013-05-25 DIAGNOSIS — Z9884 Bariatric surgery status: Secondary | ICD-10-CM

## 2013-05-25 DIAGNOSIS — E669 Obesity, unspecified: Secondary | ICD-10-CM

## 2013-05-25 NOTE — Progress Notes (Signed)
Chief Complaint:  Weight regain and failure to sustain long-term weight loss with lap band  History of Present Illness:  Lisa Horne is an 38 y.o. female 2 headaches 10 cm lap band placed 10 years ago this month with a preop weight of 322 by February of 2012 she was down to 233 which would be over 90 pound weight loss. She maintained at 06/11/2011 but has had weight regain. We have had discussions in the past about converting her from a lap band to a bypass and down to a sleeve gastrectomy. After a lot of research she would like to convert this to a sleeve. I told her that we may do it at the same time as removal of lap band for do it in  stages depending on what we find.  She would like to move forward with the process.  Past Medical History  Diagnosis Date  . Thyroid disease     hypothyroidism  . Morbid obesity   . PCOS (polycystic ovarian syndrome)     Past Surgical History  Procedure Laterality Date  . Gastric restriction surgery      lap band 2005  . Gastric banding port revision      Current Outpatient Prescriptions  Medication Sig Dispense Refill  . cyclobenzaprine (FLEXERIL) 10 MG tablet Take 1 tablet (10 mg total) by mouth 3 (three) times daily as needed for muscle spasms.  270 tablet  3  . levothyroxine (SYNTHROID, LEVOTHROID) 150 MCG tablet Take 1 tablet (150 mcg total) by mouth daily.  90 tablet  3  . Multiple Vitamin (MULTIVITAMIN) capsule Take 1 capsule by mouth daily.      Marland Kitchen. omeprazole (PRILOSEC) 20 MG capsule Take 1 capsule (20 mg total) by mouth daily.  30 capsule  0  . [DISCONTINUED] diphenhydrAMINE (BENADRYL) 25 MG tablet Take 25 mg by mouth every 6 (six) hours as needed for itching.       No current facility-administered medications for this visit.   Ciprofloxacin and Hydrochlorothiazide w-triamterene Family History  Problem Relation Age of Onset  . Diabetes Father   . Cancer Father     skin  . Hypertension Mother   . Cancer Mother     breast  .  Depression Mother   . Depression Sister    Social History:   reports that she has never smoked. She has never used smokeless tobacco. She reports that she does not drink alcohol or use illicit drugs.   REVIEW OF SYSTEMS - PERTINENT POSITIVES ONLY: She has a cold today and did not want me to examine her closely.  Physical Exam:   Blood pressure 122/86, pulse 68, resp. rate 14, height 5\' 9"  (1.753 m), weight 310 lb 12.8 oz (140.978 kg). Body mass index is 45.88 kg/(m^2).  Gen:  WDWN white female NAD  Neurological: Alert and oriented to person, place, and time. Motor and sensory function is grossly intact  Head: Normocephalic and atraumatic.  Eyes: Conjunctivae are normal. Pupils are equal, round, and reactive to light. No scleral icterus.  Neck: Normal range of motion. Neck supple. No tracheal deviation or thyromegaly present.  Cardiovascular:  SR without murmurs or gallops.  No carotid bruits Respiratory: Effort normal.  No respiratory distress. No chest wall tenderness. Breath sounds normal.  No wheezes, rales or rhonchi.  Abdomen:  nontender with band in place. GU: Musculoskeletal: Normal range of motion. Extremities are nontender. No cyanosis, edema or clubbing noted Lymphadenopathy: No cervical, preauricular, postauricular or axillary adenopathy is  present Skin: Skin is warm and dry. No rash noted. No diaphoresis. No erythema. No pallor. Pscyh: Normal mood and affect. Behavior is normal. Judgment and thought content normal.   LABORATORY RESULTS: No results found for this or any previous visit (from the past 48 hour(s)).  RADIOLOGY RESULTS: No results found.  Problem List: Patient Active Problem List   Diagnosis Date Noted  . Obesity 12/10/2011  . Lapband 10 cm Feb 2005 06/13/2011  . BACK PAIN, LUMBAR 04/30/2010  . SPRAIN&STRAIN OF UNSPECIFIED SITE OF HIP&THIGH 11/09/2009  . HYPOTHYROIDISM 12/02/2006  . HYPERTENSION 12/02/2006    Assessment & Plan: Failure of lap band to  maintain weight loss. We'll like to move toward conversion to a sleeve gastrectomy.    Matt B. Daphine Deutscher, MD, [facility] [phone] beeper [phone]  05/25/2013 2:34 PM

## 2013-05-25 NOTE — Patient Instructions (Signed)
Sleeve Gastrectomy A sleeve gastrectomy is a surgery in which a large portion of the stomach is removed. After the surgery, the stomach will be a narrow tube about the size of a banana. This surgery is performed to help a person lose weight. The person loses weight because the reduced size of the stomach restricts the amount of food that the person can eat. The stomach will hold much less food than before the surgery. Also, the part of the stomach that is removed produces a hormone that causes hunger.  This surgery is done for people who have morbid obesity, defined as a body mass index (BMI) greater than 40. BMI is an estimate of body fat and is calculated from the height and weight of a person. This surgery may also be done for people with a BMI between 35 and 40 if they have other diseases, such as type 2 diabetes mellitus, obstructive sleep apnea, or heart and lung disorders (cardiopulmonary diseases).  LET YOUR HEALTH CARE PROVIDER KNOW ABOUT:  Any allergies you have.   All medicines you are taking, including vitamins, herbs, eyedrops, creams, and over-the-counter medicines.   Use of steroids (by mouth or creams).   Previous problems you or members of your family have had with the use of anesthetics.   Any blood disorders you have.   Previous surgeries you have had.   Possibility of pregnancy, if this applies.   Other health problems you have. RISKS AND COMPLICATIONS Generally, sleeve gastrectomy is a safe procedure. However, as with any procedure, complications can occur. Possible complications include:  Infection.  Bleeding.  Blood clots.  Damage to other organs or tissue.  Leakage of fluid from the stomach into the abdominal cavity (rare). BEFORE THE PROCEDURE  You may need to have blood tests and imaging tests (such as X-rays or ultrasonography) done before the day of surgery. A test to evaluate your esophagus and how it moves (esophageal manometry) may also be  done.  You may be placed on a liquid diet 2 3 weeks before the surgery.  Ask your health care provider about changing or stopping your regular medicines.  Do not eat or drink anything for at least 8 hours before the procedure.   Make plans to have someone drive you home after your hospital stay. Also arrange for someone to help you with activities during recovery. PROCEDURE  A laparoscopic technique is usually used for this surgery:  You will be given medicine to make you sleep through the procedure (general anesthetic). This medicine will be given through an intravenous (IV) access tube that is put into one of your veins.  Once you are asleep, your abdomen will be cleaned and sterilized.  Several small incisions will be made in your abdomen.  Your abdomen will be filled with air so that it expands. This gives the surgeon more room to operate and makes your organs easier to see.  A thin, lighted tube with a tiny camera on the end (laparoscope) is put through a small incision in your abdomen. The camera on the laparoscope sends a picture to a TV screen in the operating room. This gives the surgeon a good view inside the abdomen.  Hollow tubes are put through the other small incisions in your abdomen. The tools needed for the procedure are put through these tubes.  The surgeon uses staples to divide part of the stomach and then removes it through one of the incisions.  The remaining stomach may be reinforced using   stitches or surgical glue or both to prevent leakage of the stomach contents. A small tube (drain) may be placed through one of the incisions to allow extra fluid to flow from the area.  The incisions are closed with stitches, staples, or glue. AFTER THE PROCEDURE  You will be monitored closely in a recovery area. Once the anesthetic has worn off, you will likely be moved to a regular hospital room.  You will be given medicine for pain and nausea.   You may have a drain  from one of the incisions in your abdomen. If a drain is used, it may stay in place after you go home from the hospital and be removed at a follow-up appointment.   You will be encouraged to walk around several times a day. This helps prevent blood clots.  You will be started on a liquid diet the first day after your surgery. Sometimes a test is done to check for leaking before you can eat.  You will be urged to cough and do deep breathing exercises. This helps prevent a lung infection after a surgery.  You will likely need to stay in the hospital for a few days.  Document Released: 02/02/2009 Document Revised: 12/08/2012 Document Reviewed: 08/20/2012 [employer] Patient Information 2014 [employer].  

## 2013-07-08 ENCOUNTER — Other Ambulatory Visit: Payer: Self-pay | Admitting: Family Medicine

## 2013-07-13 ENCOUNTER — Other Ambulatory Visit (INDEPENDENT_AMBULATORY_CARE_PROVIDER_SITE_OTHER): Payer: Managed Care, Other (non HMO)

## 2013-07-13 DIAGNOSIS — Z0000 Encounter for general adult medical examination without abnormal findings: Secondary | ICD-10-CM

## 2013-07-13 LAB — POCT URINALYSIS DIPSTICK
Bilirubin, UA: NEGATIVE
GLUCOSE UA: NEGATIVE
Ketones, UA: NEGATIVE
NITRITE UA: NEGATIVE
Protein, UA: NEGATIVE
Spec Grav, UA: 1.01
UROBILINOGEN UA: 0.2
pH, UA: 6.5

## 2013-07-14 ENCOUNTER — Other Ambulatory Visit: Payer: 59

## 2013-07-19 MED ORDER — NITROFURANTOIN MONOHYD MACRO 100 MG PO CAPS: 100.0000 mg | ORAL_CAPSULE | Freq: Two times a day (BID) | ORAL | Status: DC

## 2013-07-19 NOTE — Addendum Note (Signed)
Addended by: Aniceto BossNIMMONS, SYLVIA A on: 07/19/2013 04:56 PM   Modules accepted: Orders

## 2013-07-21 ENCOUNTER — Ambulatory Visit (INDEPENDENT_AMBULATORY_CARE_PROVIDER_SITE_OTHER): Payer: Managed Care, Other (non HMO) | Admitting: Family Medicine

## 2013-07-21 ENCOUNTER — Encounter: Payer: Self-pay | Admitting: Family Medicine

## 2013-07-21 VITALS — BP 128/66 | HR 99 | Temp 98.5°F | Ht 68.25 in | Wt 320.0 lb

## 2013-07-21 DIAGNOSIS — Z0000 Encounter for general adult medical examination without abnormal findings: Secondary | ICD-10-CM

## 2013-07-21 MED ORDER — METFORMIN HCL 500 MG PO TABS: 500.0000 mg | ORAL_TABLET | Freq: Two times a day (BID) | ORAL | Status: DC

## 2013-07-21 MED ORDER — LEVOTHYROXINE SODIUM 200 MCG PO TABS: 200.0000 ug | ORAL_TABLET | Freq: Every day | ORAL | Status: DC

## 2013-07-21 NOTE — Progress Notes (Signed)
Pre visit review using our clinic review tool, if applicable. No additional management support is needed unless otherwise documented below in the visit note. 

## 2013-07-21 NOTE — Progress Notes (Signed)
   Subjective:    Patient ID: Lisa Horne, female    DOB: 10-Mar-1976, 38 y.o.   MRN: [id number]  HPI 38 yr old female for a cpx. She feels tired all the time and a little depressed. She is exercising but cannot lose weight.    Review of Systems  Constitutional: Positive for fatigue. Negative for activity change, appetite change and unexpected weight change.  HENT: Negative.   Eyes: Negative.   Respiratory: Negative.   Cardiovascular: Negative.   Gastrointestinal: Negative.   Genitourinary: Negative for dysuria, urgency, frequency, hematuria, flank pain, decreased urine volume, enuresis, difficulty urinating, pelvic pain and dyspareunia.  Musculoskeletal: Negative.   Skin: Negative.   Neurological: Negative.   Psychiatric/Behavioral: Negative.        Objective:   Physical Exam  Constitutional: She is oriented to person, place, and time. She appears well-developed and well-nourished. No distress.  HENT:  Head: Normocephalic and atraumatic.  Right Ear: External ear normal.  Left Ear: External ear normal.  Nose: Nose normal.  Mouth/Throat: Oropharynx is clear and moist. No oropharyngeal exudate.  Eyes: Conjunctivae and EOM are normal. Pupils are equal, round, and reactive to light. No scleral icterus.  Neck: Normal range of motion. Neck supple. No JVD present. No thyromegaly present.  Cardiovascular: Normal rate, regular rhythm, normal heart sounds and intact distal pulses.  Exam reveals no gallop and no friction rub.   No murmur heard. Pulmonary/Chest: Effort normal and breath sounds normal. No respiratory distress. She has no wheezes. She has no rales. She exhibits no tenderness.  Abdominal: Soft. Bowel sounds are normal. She exhibits no distension and no mass. There is no tenderness. There is no rebound and no guarding.  Musculoskeletal: Normal range of motion. She exhibits no edema and no tenderness.  Lymphadenopathy:    She has no cervical adenopathy.  Neurological: She  is alert and oriented to person, place, and time. She has normal reflexes. No cranial nerve deficit. She exhibits normal muscle tone. Coordination normal.  Skin: Skin is warm and dry. No rash noted. No erythema.  Psychiatric: She has a normal mood and affect. Her behavior is normal. Judgment and thought content normal.          Assessment & Plan:  Well exam. Her diabetes is well controlled but her Synthroid dose is too low. I told her that this could be the reason why she has been feeling poorly. Increase this to 200 mcg daily and recheck in 90 days

## 2013-07-27 ENCOUNTER — Telehealth: Payer: Self-pay | Admitting: Family Medicine

## 2013-07-27 MED ORDER — METFORMIN HCL 500 MG PO TABS: 500.0000 mg | ORAL_TABLET | Freq: Two times a day (BID) | ORAL | Status: DC

## 2013-07-27 MED ORDER — LEVOTHYROXINE SODIUM 200 MCG PO TABS: 200.0000 ug | ORAL_TABLET | Freq: Every day | ORAL | Status: DC

## 2013-07-27 NOTE — Telephone Encounter (Signed)
Pt states that the rx transfer from [facility] to [facility] is going to take 10-14 days, pt does not want to wait that long.  Pt is requesting levothyroxine (SYNTHROID) 200 MCG tablet and metFORMIN (GLUCOPHAGE) 500 MG tablet to [facility] [address]  Thanks.

## 2013-07-27 NOTE — Telephone Encounter (Signed)
I spoke with pt and she would like a 90 day supply and send to [facility] [address], while waiting on [facility] and I did send both scripts [facility] to [facility].

## 2013-07-29 ENCOUNTER — Telehealth: Payer: Self-pay | Admitting: Family Medicine

## 2013-07-29 NOTE — Telephone Encounter (Signed)
I fax[facility]

## 2013-08-03 ENCOUNTER — Telehealth: Payer: Self-pay | Admitting: Family Medicine

## 2013-08-03 NOTE — Telephone Encounter (Signed)
[facility] need to know if this should be a 90 supply of the levothyroxine (SYNTHROID) 200 MCG tablet   Phone number ; [phone]

## 2013-08-03 NOTE — Telephone Encounter (Signed)
I spoke with [facility] and confirmed that this is a 90 day supply. Pt has picked this from local pharmacy, they will mail when its due.

## 2013-10-22 ENCOUNTER — Other Ambulatory Visit: Payer: Self-pay | Admitting: Family Medicine

## 2013-10-24 NOTE — Progress Notes (Signed)
Dr Daphine Deutschermartin-- Need PR EOP ORDERS PLEASE-  [facility]

## 2013-10-25 ENCOUNTER — Encounter (HOSPITAL_COMMUNITY): Payer: Self-pay | Admitting: Pharmacy Technician

## 2013-10-28 NOTE — Patient Instructions (Addendum)
Your procedure is scheduled on:  11/08/13  TUESDAY  [facility]- FOLLOW SIGNS TO SHORT STAY CENTER Short Stay Center at    1145   AM.   Call this number if you have problems the morning of surgery: [phone]     IF INSTRUCTED IN BOWEL PREP BY OFFICE-  FOLLOW THOSE DIRECTIONS   NO FOOD ( if allowed by office)  After MIDNIGHT Sunday NIGHT/   MAY HAVE CLEAR LIQUIDS  UNTIL  0745 am TUEDSAY MORNING-  THEN NOTHING BY MOUTH   Take these medicines the morning of surgery with A SIP OF WATER: LEVOTHYROXINE, NEXIUM MAY TAKE CYCLOBENZAPRINE IF NEEDED DO NOT TAKE ANY METFORMIN MORNING OF SURGERY   .  Contacts, dentures or partial plates, or metal hairpins  can not be worn to surgery. Your family will be responsible for glasses, dentures, hearing aides while you are in surgery  Leave suitcase in the car. After surgery it may be brought to your room.  For patients admitted to the hospital, checkout time is 11:00 AM day of  discharge.         [facility] IS NOT RESPONSIBLE FOR ANY VALUABLES                                                                                                                                     [place] - Preparing for Surgery Before surgery, you can play an important role.  Because skin is not sterile, your skin needs to be as free of germs as possible.  You can reduce the number of germs on your skin by washing with CHG (chlorahexidine gluconate) soap before surgery.  CHG is an antiseptic cleaner which kills germs and bonds with the skin to continue killing germs even after washing. Please DO NOT use if you have an allergy to CHG or antibacterial soaps.  If your skin becomes reddened/irritated stop using the CHG and inform your nurse when you arrive at Short Stay. Do not shave (including legs and underarms) for at least 48 hours prior to the first CHG shower.  You may shave your face/neck. Please follow these instructions  carefully:  1.  Shower with CHG Soap the night before surgery and the  morning of Surgery.  2.  If you choose to wash your hair, wash your hair first as usual with your  normal  shampoo.  3.  After you shampoo, rinse your hair and body thoroughly to remove the  shampoo.                           4.  Use CHG as you would any other liquid soap.  You can apply chg directly  to the skin and wash  Gently with a scrungie or clean washcloth.  5.  Apply the CHG Soap to your body ONLY FROM THE NECK DOWN.   Do not use on face/ open                           Wound or open sores. Avoid contact with eyes, ears mouth and genitals (private parts).                       Wash face,  Genitals (private parts) with your normal soap.             6.  Wash thoroughly, paying special attention to the area where your surgery  will be performed.  7.  Thoroughly rinse your body with warm water from the neck down.  8.  DO NOT shower/wash with your normal soap after using and rinsing off  the CHG Soap.                9.  Pat yourself dry with a clean towel.            10.  Wear clean pajamas.            11.  Place clean sheets on your bed the night of your first shower and do not  sleep with pets. Day of Surgery : Do not apply any lotions/deodorants the morning of surgery.  Please wear clean clothes to the hospital/surgery center.  FAILURE TO FOLLOW THESE INSTRUCTIONS MAY RESULT IN THE CANCELLATION OF YOUR SURGERY PATIENT SIGNATURE_________________________________  NURSE SIGNATURE__________________________________  ________________________________________________________________________

## 2013-10-28 NOTE — Progress Notes (Signed)
Chest, ekg 9/14 EPIC

## 2013-10-31 ENCOUNTER — Encounter (HOSPITAL_COMMUNITY): Payer: Self-pay

## 2013-10-31 ENCOUNTER — Encounter (HOSPITAL_COMMUNITY)
Admission: RE | Admit: 2013-10-31 | Discharge: 2013-10-31 | Disposition: A | Discharge status: Home / Self Care | Payer: Managed Care, Other (non HMO) | Source: Ambulatory Visit | Attending: Surgery | Admitting: Surgery

## 2013-10-31 DIAGNOSIS — Z01818 Encounter for other preprocedural examination: Secondary | ICD-10-CM | POA: Insufficient documentation

## 2013-10-31 DIAGNOSIS — Z01812 Encounter for preprocedural laboratory examination: Secondary | ICD-10-CM | POA: Insufficient documentation

## 2013-10-31 HISTORY — DX: Other specified postprocedural states: R11.2

## 2013-10-31 HISTORY — DX: Gastro-esophageal reflux disease without esophagitis: K21.9

## 2013-10-31 HISTORY — DX: Other specified postprocedural states: Z98.890

## 2013-10-31 LAB — HCG, SERUM, QUALITATIVE: PREG SERUM: NEGATIVE

## 2013-10-31 LAB — BASIC METABOLIC PANEL
ANION GAP: 16 — AB (ref 5–15)
BUN: 16 mg/dL (ref 6–23)
CHLORIDE: 100 meq/L (ref 96–112)
CO2: 22 meq/L (ref 19–32)
Calcium: 9.3 mg/dL (ref 8.4–10.5)
Creatinine, Ser: 0.75 mg/dL (ref 0.50–1.10)
GFR calc Af Amer: >90 mL/min (ref >90)
GFR calc non Af Amer: >90 mL/min (ref >90)
GLUCOSE: 109 mg/dL — AB (ref 70–99)
POTASSIUM: 4 meq/L (ref 3.7–5.3)
SODIUM: 138 meq/L (ref 137–147)

## 2013-10-31 LAB — CBC
HCT: 40.2 % (ref 36.0–46.0)
Hemoglobin: 13.7 g/dL (ref 12.0–15.0)
MCH: 29.3 pg (ref 26.0–34.0)
MCHC: 34.1 g/dL (ref 30.0–36.0)
MCV: 85.9 fL (ref 78.0–100.0)
PLATELETS: 254 10*3/uL (ref 150–400)
RBC: 4.68 MIL/uL (ref 3.87–5.11)
RDW: 13.3 % (ref 11.5–15.5)
WBC: 7.4 10*3/uL (ref 4.0–10.5)

## 2013-10-31 NOTE — Progress Notes (Signed)
DELETE_ WRONG CHART   L Sobia Karger RN

## 2013-10-31 NOTE — Progress Notes (Signed)
Dr [name]--NEED PRE OP ORDERS PLEASE 

## 2013-10-31 NOTE — Progress Notes (Signed)
PST NOTE--- patient states will talk with dietician/oordinator or dr Daphine Deutschermartin regarding need for bowel prep.   Patient instructed IF SHE IS EATING FOOD ON Monday-- NONE AFTER  MIDNIGHT BUT MAY HAVE CLEAR LIQUIDS Tuesday MORNING UNTIL 0745.   Verbalizes understanding.  States is not diabetic- takes Metformin for polycystic ovaries.  Orders requested x 2 via epic to dr Daphine Deutschermartin

## 2013-11-03 ENCOUNTER — Ambulatory Visit (INDEPENDENT_AMBULATORY_CARE_PROVIDER_SITE_OTHER): Payer: Private Health Insurance - Indemnity | Admitting: Surgery

## 2013-11-03 ENCOUNTER — Encounter (INDEPENDENT_AMBULATORY_CARE_PROVIDER_SITE_OTHER): Payer: Self-pay | Admitting: Surgery

## 2013-11-03 MED ORDER — HYDROCODONE-ACETAMINOPHEN 7.5-325 MG/15ML PO SOLN: 10.0000 mL | Freq: Four times a day (QID) | ORAL | Status: DC | PRN

## 2013-11-03 NOTE — Progress Notes (Signed)
Chief Complaint:  Weight regain and failure to sustain long-term weight loss with lap band  History of Present Illness:  Lisa Horne is an 38 y.o. female 2 headaches 10 cm lap band placed 10 years ago in February with a preop weight of 322 by February of 2012 she was down to 233 which would be over 90 pound weight loss. She maintained at 06/11/2011 but has had weight regain. We have had discussions in the past about converting her from a lap band to a bypass and down to a sleeve gastrectomy. After a lot of research she would like to convert this to a sleeve. I told her that we may do it at the same time as removal of lap band for do it in  stages depending on what we find.  She is scheduled for conversion next Tuesday.      Past Medical History  Diagnosis Date  . Thyroid disease     hypothyroidism  . Morbid obesity   . PCOS (polycystic ovarian syndrome)   . PONV (postoperative nausea and vomiting)   . GERD (gastroesophageal reflux disease)     Past Surgical History  Procedure Laterality Date  . Gastric restriction surgery      lap band 2005  . Gastric banding port revision      Current Outpatient Prescriptions  Medication Sig Dispense Refill  . cyclobenzaprine (FLEXERIL) 10 MG tablet Take 10 mg by mouth 3 (three) times daily as needed for muscle spasms.      . esomeprazole (NEXIUM) 20 MG capsule Take 20 mg by mouth daily with breakfast.       . ibuprofen (ADVIL,MOTRIN) 200 MG tablet Take 800 mg by mouth every 6 (six) hours as needed for mild pain or moderate pain.      . levonorgestrel (MIRENA) 20 MCG/24HR IUD 1 each by Intrauterine route once.      . levothyroxine (SYNTHROID, LEVOTHROID) 200 MCG tablet Take 200 mcg by mouth daily before breakfast.      . metFORMIN (GLUCOPHAGE) 500 MG tablet Take 500 mg by mouth 2 (two) times daily with a meal.      . Multiple Vitamin (MULTIVITAMIN) capsule Take 1 capsule by mouth daily.      . HYDROcodone-acetaminophen (HYCET) 7.5-325 mg/15 ml  solution Take 10 mLs by mouth 4 (four) times daily as needed for moderate pain.  300 mL  0  . [DISCONTINUED] diphenhydrAMINE (BENADRYL) 25 MG tablet Take 25 mg by mouth every 6 (six) hours as needed for itching.       No current facility-administered medications for this visit.   Ciprofloxacin and Hydrochlorothiazide w-triamterene Family History  Problem Relation Age of Onset  . Diabetes Father   . Cancer Father     skin  . Hypertension Mother   . Cancer Mother     breast  . Depression Mother   . Depression Sister    Social History:   reports that she has never smoked. She has never used smokeless tobacco. She reports that she does not drink alcohol or use illicit drugs.   REVIEW OF SYSTEMS - PERTINENT POSITIVES ONLY:   Physical Exam:   Blood pressure 120/70, pulse 76, temperature 97.6 F (36.4 C), height 5' 8" (1.727 m), weight 319 lb (144.697 kg), last menstrual period 11/01/2007. Body mass index is 48.51 kg/(m^2).  Gen:  WDWN white female NAD  Neurological: Alert and oriented to person, place, and time. Motor and sensory function is grossly intact  Head: Normocephalic   and atraumatic.  Eyes: Conjunctivae are normal. Pupils are equal, round, and reactive to light. No scleral icterus.  Neck: Normal range of motion. Neck supple. No tracheal deviation or thyromegaly present.  Cardiovascular:  SR without murmurs or gallops.  No carotid bruits Respiratory: Effort normal.  No respiratory distress. No chest wall tenderness. Breath sounds normal.  No wheezes, rales or rhonchi.  Abdomen:  nontender with band in place. GU: Musculoskeletal: Normal range of motion. Extremities are nontender. No cyanosis, edema or clubbing noted Lymphadenopathy: No cervical, preauricular, postauricular or axillary adenopathy is present Skin: Skin is warm and dry. No rash noted. No diaphoresis. No erythema. No pallor. Pscyh: Normal mood and affect. Behavior is normal. Judgment and thought content normal.    LABORATORY RESULTS: No results found for this or any previous visit (from the past 48 hour(s)).  RADIOLOGY RESULTS: No results found.  Problem List: Patient Active Problem List   Diagnosis Date Noted  . Obesity 12/10/2011  . Lapband 10 cm Feb 2005 06/13/2011  . BACK PAIN, LUMBAR 04/30/2010  . SPRAIN&STRAIN OF UNSPECIFIED SITE OF HIP&THIGH 11/09/2009  . HYPOTHYROIDISM 12/02/2006  . HYPERTENSION 12/02/2006    Assessment & Plan: Failure of lap band to maintain weight loss. Removal of lapband and conversion to sleeve gastrectomy.  She is aware that this may be a one or two stage procedure.      Matt B. Eyvonne Burchfield, MD, FACS  [facility] [phone] beeper [phone]  11/03/2013 9:45 AM      

## 2013-11-03 NOTE — Progress Notes (Signed)
Notified patient that surgery time has changed and she is to be at short stay at 0915 am- npo after MIDNIGHT-- NO LIQUIDS AS INSTRUCTED BEFORE.  VERBALIZES UNDERSTANDING

## 2013-11-03 NOTE — Patient Instructions (Signed)

## 2013-11-08 ENCOUNTER — Inpatient Hospital Stay (HOSPITAL_COMMUNITY)
Admission: RE | Admit: 2013-11-08 | Discharge: 2013-11-12 | DRG: 621 | Disposition: A | Discharge status: Home / Self Care | Payer: Managed Care, Other (non HMO) | Source: Ambulatory Visit | Attending: Surgery | Admitting: Surgery

## 2013-11-08 ENCOUNTER — Encounter (HOSPITAL_COMMUNITY)
Admission: RE | Disposition: A | Discharge status: Home / Self Care | Payer: Self-pay | Source: Ambulatory Visit | Attending: Surgery

## 2013-11-08 ENCOUNTER — Encounter (HOSPITAL_COMMUNITY): Payer: Self-pay | Admitting: Certified Registered Nurse Anesthetist

## 2013-11-08 ENCOUNTER — Encounter (HOSPITAL_COMMUNITY): Payer: Managed Care, Other (non HMO) | Admitting: Certified Registered Nurse Anesthetist

## 2013-11-08 ENCOUNTER — Inpatient Hospital Stay (HOSPITAL_COMMUNITY): Payer: Managed Care, Other (non HMO) | Admitting: Certified Registered Nurse Anesthetist

## 2013-11-08 DIAGNOSIS — Z6841 Body Mass Index (BMI) 40.0 and over, adult: Secondary | ICD-10-CM

## 2013-11-08 DIAGNOSIS — Z79899 Other long term (current) drug therapy: Secondary | ICD-10-CM

## 2013-11-08 DIAGNOSIS — Z803 Family history of malignant neoplasm of breast: Secondary | ICD-10-CM

## 2013-11-08 DIAGNOSIS — Z9884 Bariatric surgery status: Secondary | ICD-10-CM

## 2013-11-08 DIAGNOSIS — Z01812 Encounter for preprocedural laboratory examination: Secondary | ICD-10-CM

## 2013-11-08 DIAGNOSIS — Z8249 Family history of ischemic heart disease and other diseases of the circulatory system: Secondary | ICD-10-CM

## 2013-11-08 DIAGNOSIS — Z833 Family history of diabetes mellitus: Secondary | ICD-10-CM

## 2013-11-08 DIAGNOSIS — E039 Hypothyroidism, unspecified: Secondary | ICD-10-CM | POA: Diagnosis present

## 2013-11-08 DIAGNOSIS — Z791 Long term (current) use of non-steroidal anti-inflammatories (NSAID): Secondary | ICD-10-CM

## 2013-11-08 DIAGNOSIS — K219 Gastro-esophageal reflux disease without esophagitis: Secondary | ICD-10-CM | POA: Diagnosis present

## 2013-11-08 DIAGNOSIS — E282 Polycystic ovarian syndrome: Secondary | ICD-10-CM | POA: Diagnosis present

## 2013-11-08 LAB — CREATININE, SERUM
CREATININE: 0.84 mg/dL (ref 0.50–1.10)
GFR, EST NON AFRICAN AMERICAN: 86 mL/min — AB (ref >90)

## 2013-11-08 LAB — CBC
HCT: 41.5 % (ref 36.0–46.0)
Hemoglobin: 13.7 g/dL (ref 12.0–15.0)
MCH: 29.2 pg (ref 26.0–34.0)
MCHC: 33 g/dL (ref 30.0–36.0)
MCV: 88.5 fL (ref 78.0–100.0)
PLATELETS: 230 10*3/uL (ref 150–400)
RBC: 4.69 MIL/uL (ref 3.87–5.11)
RDW: 13.4 % (ref 11.5–15.5)
WBC: 15 10*3/uL — AB (ref 4.0–10.5)

## 2013-11-08 LAB — HEMOGLOBIN AND HEMATOCRIT, BLOOD
HCT: 39.8 % (ref 36.0–46.0)
Hemoglobin: 13.2 g/dL (ref 12.0–15.0)

## 2013-11-08 LAB — PREGNANCY, URINE: PREG TEST UR: NEGATIVE

## 2013-11-08 LAB — GLUCOSE, CAPILLARY: Glucose-Capillary: 132 mg/dL — ABNORMAL HIGH (ref 70–99)

## 2013-11-08 SURGERY — LAPAROSCOPIC GASTRIC BAND REMOVAL WITH LAPAROSCOPIC GASTRIC SLEEVE RESECTION
Anesthesia: General

## 2013-11-08 MED ORDER — CHLORHEXIDINE GLUCONATE CLOTH 2 % EX PADS: 6.0000 | MEDICATED_PAD | Freq: Once | CUTANEOUS | Status: DC

## 2013-11-08 MED ORDER — CEFOXITIN SODIUM 2 G IV SOLR
INTRAVENOUS | Status: AC
  Filled 2013-11-08: qty 2

## 2013-11-08 MED ORDER — LIDOCAINE HCL (CARDIAC) 20 MG/ML IV SOLN
INTRAVENOUS | Status: AC
  Filled 2013-11-08: qty 5

## 2013-11-08 MED ORDER — MIDAZOLAM HCL 2 MG/2ML IJ SOLN
INTRAMUSCULAR | Status: AC
  Filled 2013-11-08: qty 2

## 2013-11-08 MED ORDER — ONDANSETRON HCL 4 MG/2ML IJ SOLN
INTRAMUSCULAR | Status: AC
  Filled 2013-11-08: qty 2

## 2013-11-08 MED ORDER — HEPARIN SODIUM (PORCINE) 5000 UNIT/ML IJ SOLN
5000.0000 [IU] | Freq: Three times a day (TID) | INTRAMUSCULAR | Status: DC
  Administered 2013-11-08 – 2013-11-12 (×11): 5000 [IU] via SUBCUTANEOUS
  Filled 2013-11-08 (×13): qty 1

## 2013-11-08 MED ORDER — POTASSIUM CHLORIDE IN NACL 20-0.45 MEQ/L-% IV SOLN
INTRAVENOUS | Status: DC
  Administered 2013-11-08 – 2013-11-12 (×7): via INTRAVENOUS
  Filled 2013-11-08 (×9): qty 1000

## 2013-11-08 MED ORDER — LACTATED RINGERS IV SOLN
INTRAVENOUS | Status: DC
  Administered 2013-11-08 (×2): via INTRAVENOUS
  Administered 2013-11-08: 1000 mL via INTRAVENOUS

## 2013-11-08 MED ORDER — LACTATED RINGERS IV SOLN: INTRAVENOUS | Status: DC

## 2013-11-08 MED ORDER — GLYCOPYRROLATE 0.2 MG/ML IJ SOLN
INTRAMUSCULAR | Status: DC | PRN
  Administered 2013-11-08: 0.4 mg via INTRAVENOUS

## 2013-11-08 MED ORDER — PROPOFOL 10 MG/ML IV BOLUS
INTRAVENOUS | Status: DC | PRN
  Administered 2013-11-08: 20 mg via INTRAVENOUS
  Administered 2013-11-08: 200 mg via INTRAVENOUS
  Administered 2013-11-08: 50 mg via INTRAVENOUS
  Administered 2013-11-08: 71.8 mg via INTRAVENOUS

## 2013-11-08 MED ORDER — ACETAMINOPHEN 160 MG/5ML PO SOLN: 325.0000 mg | ORAL | Status: DC | PRN

## 2013-11-08 MED ORDER — NEOSTIGMINE METHYLSULFATE 10 MG/10ML IV SOLN
INTRAVENOUS | Status: DC | PRN
  Administered 2013-11-08: 4 mg via INTRAVENOUS

## 2013-11-08 MED ORDER — HEPARIN SODIUM (PORCINE) 5000 UNIT/ML IJ SOLN
5000.0000 [IU] | Freq: Three times a day (TID) | INTRAMUSCULAR | Status: DC
  Filled 2013-11-08 (×2): qty 1

## 2013-11-08 MED ORDER — CEFOXITIN SODIUM 2 G IV SOLR
2.0000 g | INTRAVENOUS | Status: AC
  Administered 2013-11-08: 2 g via INTRAVENOUS

## 2013-11-08 MED ORDER — ONDANSETRON HCL 4 MG/2ML IJ SOLN
INTRAMUSCULAR | Status: DC | PRN
  Administered 2013-11-08 (×2): 4 mg via INTRAVENOUS

## 2013-11-08 MED ORDER — FENTANYL CITRATE 0.05 MG/ML IJ SOLN
INTRAMUSCULAR | Status: DC | PRN
  Administered 2013-11-08: 150 ug via INTRAVENOUS
  Administered 2013-11-08: 50 ug via INTRAVENOUS
  Administered 2013-11-08: 100 ug via INTRAVENOUS
  Administered 2013-11-08: 50 ug via INTRAVENOUS

## 2013-11-08 MED ORDER — BUPIVACAINE LIPOSOME 1.3 % IJ SUSP
INTRAMUSCULAR | Status: DC | PRN
  Administered 2013-11-08: 20 mL

## 2013-11-08 MED ORDER — PROPOFOL 10 MG/ML IV BOLUS
INTRAVENOUS | Status: AC
  Filled 2013-11-08: qty 20

## 2013-11-08 MED ORDER — PROMETHAZINE HCL 25 MG/ML IJ SOLN
INTRAMUSCULAR | Status: AC
  Filled 2013-11-08: qty 1

## 2013-11-08 MED ORDER — LIDOCAINE HCL (CARDIAC) 20 MG/ML IV SOLN
INTRAVENOUS | Status: DC | PRN
  Administered 2013-11-08: 80 mg via INTRAVENOUS

## 2013-11-08 MED ORDER — SCOPOLAMINE 1 MG/3DAYS TD PT72
1.0000 | MEDICATED_PATCH | TRANSDERMAL | Status: DC
  Administered 2013-11-08: 1.5 mg via TRANSDERMAL
  Filled 2013-11-08: qty 1

## 2013-11-08 MED ORDER — PROMETHAZINE HCL 25 MG/ML IJ SOLN
6.2500 mg | INTRAMUSCULAR | Status: AC | PRN
Start: 2013-11-08 — End: 2013-11-08
  Administered 2013-11-08 (×2): 6.25 mg via INTRAVENOUS

## 2013-11-08 MED ORDER — UNJURY CHICKEN SOUP POWDER
2.0000 [oz_av] | Freq: Four times a day (QID) | ORAL | Status: DC
  Administered 2013-11-11 (×3): 2 [oz_av] via ORAL

## 2013-11-08 MED ORDER — HYDROMORPHONE HCL PF 2 MG/ML IJ SOLN
INTRAMUSCULAR | Status: AC
  Filled 2013-11-08: qty 1

## 2013-11-08 MED ORDER — FENTANYL CITRATE 0.05 MG/ML IJ SOLN
INTRAMUSCULAR | Status: AC
  Filled 2013-11-08: qty 2

## 2013-11-08 MED ORDER — SCOPOLAMINE 1 MG/3DAYS TD PT72
MEDICATED_PATCH | TRANSDERMAL | Status: AC
  Filled 2013-11-08: qty 1

## 2013-11-08 MED ORDER — HYDROMORPHONE HCL PF 1 MG/ML IJ SOLN
INTRAMUSCULAR | Status: DC | PRN
  Administered 2013-11-08 (×2): 1 mg via INTRAVENOUS

## 2013-11-08 MED ORDER — ONDANSETRON HCL 4 MG/2ML IJ SOLN
4.0000 mg | INTRAMUSCULAR | Status: DC | PRN
  Administered 2013-11-08 – 2013-11-09 (×5): 4 mg via INTRAVENOUS
  Filled 2013-11-08 (×5): qty 2

## 2013-11-08 MED ORDER — ACETAMINOPHEN 160 MG/5ML PO SOLN: 650.0000 mg | ORAL | Status: DC | PRN

## 2013-11-08 MED ORDER — FENTANYL CITRATE 0.05 MG/ML IJ SOLN
INTRAMUSCULAR | Status: AC
  Filled 2013-11-08: qty 5

## 2013-11-08 MED ORDER — SUCCINYLCHOLINE CHLORIDE 20 MG/ML IJ SOLN
INTRAMUSCULAR | Status: DC | PRN
  Administered 2013-11-08: 100 mg via INTRAVENOUS

## 2013-11-08 MED ORDER — LACTATED RINGERS IR SOLN
Status: DC | PRN
  Administered 2013-11-08: 3000 mL

## 2013-11-08 MED ORDER — HYDROMORPHONE HCL PF 1 MG/ML IJ SOLN
0.2500 mg | INTRAMUSCULAR | Status: DC | PRN
Start: 2013-11-08 — End: 2013-11-08

## 2013-11-08 MED ORDER — HEPARIN SODIUM (PORCINE) 5000 UNIT/ML IJ SOLN
5000.0000 [IU] | INTRAMUSCULAR | Status: AC
  Administered 2013-11-08: 5000 [IU] via SUBCUTANEOUS
  Filled 2013-11-08: qty 1

## 2013-11-08 MED ORDER — MORPHINE SULFATE 2 MG/ML IJ SOLN
2.0000 mg | INTRAMUSCULAR | Status: DC | PRN
  Administered 2013-11-08 – 2013-11-11 (×6): 2 mg via INTRAVENOUS
  Administered 2013-11-11 (×2): 4 mg via INTRAVENOUS
  Filled 2013-11-08 (×2): qty 2
  Filled 2013-11-08 (×3): qty 1
  Filled 2013-11-08 (×2): qty 2
  Filled 2013-11-08 (×2): qty 1

## 2013-11-08 MED ORDER — 0.9 % SODIUM CHLORIDE (POUR BTL) OPTIME
TOPICAL | Status: DC | PRN
  Administered 2013-11-08: 1000 mL

## 2013-11-08 MED ORDER — DEXAMETHASONE SODIUM PHOSPHATE 10 MG/ML IJ SOLN
INTRAMUSCULAR | Status: AC
  Filled 2013-11-08: qty 1

## 2013-11-08 MED ORDER — CISATRACURIUM BESYLATE (PF) 10 MG/5ML IV SOLN
INTRAVENOUS | Status: DC | PRN
  Administered 2013-11-08: 6 mg via INTRAVENOUS
  Administered 2013-11-08: 10 mg via INTRAVENOUS

## 2013-11-08 MED ORDER — UNJURY CHOCOLATE CLASSIC POWDER
2.0000 [oz_av] | Freq: Four times a day (QID) | ORAL | Status: DC
  Administered 2013-11-11: 2 [oz_av] via ORAL

## 2013-11-08 MED ORDER — BUPIVACAINE LIPOSOME 1.3 % IJ SUSP
20.0000 mL | Freq: Once | INTRAMUSCULAR | Status: DC
  Filled 2013-11-08: qty 20

## 2013-11-08 MED ORDER — OXYCODONE HCL 5 MG/5ML PO SOLN: 5.0000 mg | ORAL | Status: DC | PRN

## 2013-11-08 MED ORDER — INSULIN ASPART 100 UNIT/ML ~~LOC~~ SOLN: 0.0000 [IU] | SUBCUTANEOUS | Status: DC

## 2013-11-08 MED ORDER — UNJURY VANILLA POWDER: 2.0000 [oz_av] | Freq: Four times a day (QID) | ORAL | Status: DC

## 2013-11-08 MED ORDER — MIDAZOLAM HCL 5 MG/5ML IJ SOLN
INTRAMUSCULAR | Status: DC | PRN
  Administered 2013-11-08: 2 mg via INTRAVENOUS

## 2013-11-08 SURGICAL SUPPLY — 65 items
APPLICATOR COTTON TIP 6IN STRL (MISCELLANEOUS) IMPLANT
APPLIER CLIP ROT 10 11.4 M/L (STAPLE)
APPLIER CLIP ROT 13.4 12 LRG (CLIP)
BLADE HEX COATED 2.75 (ELECTRODE) ×2 IMPLANT
BLADE SURG 15 STRL LF DISP TIS (BLADE) IMPLANT
BLADE SURG 15 STRL SS (BLADE)
CABLE HIGH FREQUENCY MONO STRZ (ELECTRODE) IMPLANT
CLIP APPLIE ROT 10 11.4 M/L (STAPLE) IMPLANT
CLIP APPLIE ROT 13.4 12 LRG (CLIP) IMPLANT
DERMABOND ADVANCED (GAUZE/BANDAGES/DRESSINGS)
DERMABOND ADVANCED .7 DNX12 (GAUZE/BANDAGES/DRESSINGS) IMPLANT
DEVICE SUT QUICK LOAD TK 5 (STAPLE) IMPLANT
DEVICE SUT TI-KNOT TK 5X26 (MISCELLANEOUS) IMPLANT
DEVICE SUTURE ENDOST 10MM (ENDOMECHANICALS) IMPLANT
DEVICE TROCAR PUNCTURE CLOSURE (ENDOMECHANICALS) ×2 IMPLANT
DISSECTOR BLUNT TIP ENDO 5MM (MISCELLANEOUS) ×2 IMPLANT
DRAIN CHANNEL 19F RND (DRAIN) IMPLANT
DRAPE CAMERA CLOSED 9X96 (DRAPES) ×2 IMPLANT
ELECT REM PT RETURN 9FT ADLT (ELECTROSURGICAL) ×2
ELECTRODE REM PT RTRN 9FT ADLT (ELECTROSURGICAL) ×1 IMPLANT
EVACUATOR SILICONE 100CC (DRAIN) IMPLANT
GAUZE SPONGE 4X4 12PLY STRL (GAUZE/BANDAGES/DRESSINGS) IMPLANT
GLOVE BIOGEL M 8.0 STRL (GLOVE) ×2 IMPLANT
GOWN SPEC L4 XLG W/TWL (GOWN DISPOSABLE) IMPLANT
GOWN STRL REUS W/TWL XL LVL3 (GOWN DISPOSABLE) ×8 IMPLANT
HANDLE STAPLE EGIA 4 XL (STAPLE) ×2 IMPLANT
HOVERMATT SINGLE USE (MISCELLANEOUS) ×2 IMPLANT
KIT BASIN OR (CUSTOM PROCEDURE TRAY) ×2 IMPLANT
NEEDLE SPNL 22GX3.5 QUINCKE BK (NEEDLE) ×2 IMPLANT
PACK UNIVERSAL I (CUSTOM PROCEDURE TRAY) ×2 IMPLANT
PENCIL BUTTON HOLSTER BLD 10FT (ELECTRODE) ×2 IMPLANT
RELOAD EGIA BLACK ROTIC 45MM (STAPLE) ×4 IMPLANT
RELOAD ENDO STITCH (ENDOMECHANICALS) IMPLANT
RELOAD STAPLER BLUE 60MM (STAPLE) IMPLANT
RELOAD STAPLER GOLD 60MM (STAPLE) IMPLANT
RELOAD STAPLER GREEN 60MM (STAPLE) IMPLANT
RELOAD TRI 60 ART MED THCK PUR (STAPLE) ×8 IMPLANT
SCISSORS LAP 5X45 EPIX DISP (ENDOMECHANICALS) ×2 IMPLANT
SCRUB PCMX 4 OZ (MISCELLANEOUS) ×2 IMPLANT
SEALANT SURGICAL APPL DUAL CAN (MISCELLANEOUS) IMPLANT
SET IRRIG TUBING LAPAROSCOPIC (IRRIGATION / IRRIGATOR) ×2 IMPLANT
SHEARS CURVED HARMONIC AC 45CM (MISCELLANEOUS) ×2 IMPLANT
SLEEVE ADV FIXATION 12X100MM (TROCAR) IMPLANT
SLEEVE ADV FIXATION 5X100MM (TROCAR) ×4 IMPLANT
SLEEVE GASTRECTOMY 36FR VISIGI (MISCELLANEOUS) ×2 IMPLANT
SOLUTION ANTI FOG 6CC (MISCELLANEOUS) ×2 IMPLANT
SPONGE LAP 18X18 X RAY DECT (DISPOSABLE) ×2 IMPLANT
STAPLE ECHEON FLEX 60 POW ENDO (STAPLE) IMPLANT
STAPLER RELOAD BLUE 60MM (STAPLE)
STAPLER RELOAD GOLD 60MM (STAPLE)
STAPLER RELOAD GREEN 60MM (STAPLE)
SUT ETHILON 2 0 PS N (SUTURE) IMPLANT
SUT VIC AB 4-0 SH 18 (SUTURE) ×2 IMPLANT
SYRINGE 20CC LL (MISCELLANEOUS) ×2 IMPLANT
SYRINGE 60CC LL (MISCELLANEOUS) ×2 IMPLANT
TOWEL OR 17X26 10 PK STRL BLUE (TOWEL DISPOSABLE) ×4 IMPLANT
TOWEL OR NON WOVEN STRL DISP B (DISPOSABLE) ×2 IMPLANT
TRAY FOLEY CATH 14FRSI W/METER (CATHETERS) ×2 IMPLANT
TROCAR ADV FIXATION 12X100MM (TROCAR) ×2 IMPLANT
TROCAR ADV FIXATION 5X100MM (TROCAR) ×2 IMPLANT
TROCAR BLADELESS 15MM (ENDOMECHANICALS) ×2 IMPLANT
TROCAR BLADELESS OPT 5 100 (ENDOMECHANICALS) ×2 IMPLANT
TUBING CONNECTING 10 (TUBING) ×2 IMPLANT
TUBING ENDO SMARTCAP (MISCELLANEOUS) ×2 IMPLANT
TUBING FILTER THERMOFLATOR (ELECTROSURGICAL) ×2 IMPLANT

## 2013-11-08 NOTE — Interval H&P Note (Signed)
History and Physical Interval Note:  11/08/2013 11:25 AM  Lisa Horne  has presented today for surgery, with the diagnosis of Morbid Obesity  The various methods of treatment have been discussed with the patient and family. After consideration of risks, benefits and other options for treatment, the patient has consented to  Procedure(s): LAPAROSCOPIC GASTRIC BAND REMOVAL WITH LAPAROSCOPIC GASTRIC SLEEVE RESECTION (N/A) as a surgical intervention .  The patient's history has been reviewed, patient examined, no change in status, stable for surgery.  I have reviewed the patient's chart and labs.  Questions were answered to the patient's satisfaction.     Karynn Deblasi B

## 2013-11-08 NOTE — Transfer of Care (Signed)
Immediate Anesthesia Transfer of Care Note  Patient: Lisa HatchetBobbie G Chavira  Procedure(s) Performed: Procedure(s): LAPAROSCOPIC GASTRIC BAND AND PORT REMOVAL WITH LAPAROSCOPIC GASTRIC SLEEVE RESECTION (N/A)  Patient Location: PACU  Anesthesia Type:General  Level of Consciousness: awake, alert  and oriented  Airway & Oxygen Therapy: Patient Spontanous Breathing and Patient connected to face mask oxygen  Post-op Assessment: Report given to PACU RN and Post -op Vital signs reviewed and stable  Post vital signs: Reviewed and stable  Complications: No apparent anesthesia complications

## 2013-11-08 NOTE — Op Note (Signed)
Lisa HatchetBobbie G Blume [id number] [birth year] 11/08/2013  Preoperative diagnosis: morbid obesity; h/o laparoscopic adjustable gastric banding  Postoperative diagnosis: Same   Procedure: Esophagogastroduodenoscopy   Surgeon: Mary SellaEric M. Yudit Modesitt M.D., FACS   Anesthesia: Gen.   Indications for procedure: [age]year old WF undergoing removal of Laparoscopic adjustable gastric band to Laparoscopic Gastric Sleeve Resection and an EGD was requested to evaluate the new gastric sleeve.   Description of procedure: After we have completed the sleeve resection, I scrubbed out and obtained the Olympus endoscope. I gently placed endoscope in the patient's oropharynx and gently glided it down the esophagus without any difficulty under direct visualization. Once I was in the gastric sleeve, I insufflated the stomach with air. I was able to cannulate and advanced the scope through the gastric sleeve. I was able to cannulate the duodenum with ease. Dr. Daphine DeutscherMartin had placed saline in the upper abdomen. Upon further insufflation of the gastric sleeve there was no evidence of bubbles. GE junction located at 44 cm.  Upon further inspection of the gastric sleeve, the mucosa appeared normal. There is no evidence of any mucosal abnormality. The sleeve was widely patent at the angularis. There was no evidence of bleeding. The gastric sleeve was decompressed. The scope was withdrawn. The patient tolerated this portion of the procedure well. Please see Dr Ermalene SearingMartin's operative note for details regarding the laparoscopic gastric sleeve resection.   Mary SellaEric M. Andrey CampanileWilson, MD, FACS  General, Bariatric, & Minimally Invasive Surgery  [facility], [place]

## 2013-11-08 NOTE — Op Note (Signed)
Surgeon: Wenda LowMatt Shanessa Hodak, MD, FACS  Asst:  Gaynelle AduEric Wilson, MD, FACS  Anes:  General endotracheal  Procedure: Removal of 10 cm Lapband; Laparoscopic sleeve gastrectomy; upper endoscopy by Dr. Andrey CampanileWilson  Diagnosis: Morbid obesity with failed lapband  Complications: none  EBL:   minimal cc  Description of Procedure:  The patient was take to OR 1 and given general anesthesia.  The abdomen was prepped with PCMX and draped sterilely.  A timeout was performed.  Access to the abdomen was achieved with a 5 mm Optiview through the left upper quadrant. 5 mm technology was used throughout except for a 15 mm port to the right of midline near the umbilicus. With a Satira Mccallumathanson in place I then began dissecting free or 10 cm lap band. This was tenaciously stuck to the liver. This was sharply incised and then I dissected free the buckle from this 10 cm band and I ended up removing the buckle and pieces and then removing the band taking it out completely except for the port and a little bit of the tubing. I then went through the tract and took down the plication using scissors taking it out laterally and freeing it completely. There was no sign of any perforation and so I felt we could move forward with sleeve gastrectomy.    The ViSiGi 36Fr tube was inserted to deflate the stomach and was pulled back into the esophagus.    The pylorus was identified and we measured 5 cm back and marked the antrum.  At that point we began dissection to take down the greater curvature of the stomach using the Harmonic scalpel.  This dissection was taken all the way up to the left crus.  Posterior attachments of the stomach were also taken down.    The ViSiGi tube was then passed into the antrum and suction applied so that it was snug along the lessor curvature.  The "crow's foot" or incisura was identified.  The sleeve gastrectomy was begun using the [employer] platform stapler beginning with a 4.5 black load with the TRS reinforcement. This was  followed by a second black 6 cm load with TRS reinforcement which was then followed by several 6 cm purple loads with TRS reinforcement. I stayed along the dizzy G-tube and felt that we did not have any corkscrewing and had a good uniform sleeve created. The stapling particularly the proximal stomach was performed without difficulty and I did not encounter anything that seemed to bother the division of the stomach..  When the sleeve was complete the tube was taken off suction and insufflated briefly.  The tube was withdrawn.  Upper endoscopy was then performed by Dr. Andrey CampanileWilson. No bleeding or bubbles were seen. Healthy pouch appeared to be present.    The specimen was extracted through the 15 trocar site.  Wounds were infiltrated with Exparel and closed with 4-0 Vicryl. The 15 mm port was closed with a single 0 Vicryl passer the Endo Close. The lap band port was then removed by cutting down and and dissecting it off its location which was on the fascia without mesh. The port and the remaining tubing were removed in toto. We reinspected the inside of the abdomen and no bleeding was noted along the staple line.  Matt B. Daphine DeutscherMartin, MD, [facility], [place] [phone]

## 2013-11-08 NOTE — Anesthesia Preprocedure Evaluation (Signed)
Anesthesia Evaluation  Patient identified by MRN, date of birth, ID band Patient awake    Reviewed: Allergy & Precautions, H&P , NPO status , Patient's Chart, lab work & pertinent test results  History of Anesthesia Complications (+) PONV  Airway Mallampati: II TM Distance: >3 FB Neck ROM: Full    Dental  (+) Teeth Intact, Dental Advisory Given   Pulmonary neg pulmonary ROS,  breath sounds clear to auscultation  Pulmonary exam normal       Cardiovascular hypertension, negative cardio ROS  Rhythm:Regular Rate:Normal     Neuro/Psych negative neurological ROS  negative psych ROS   GI/Hepatic negative GI ROS, Neg liver ROS,   Endo/Other  negative endocrine ROSHypothyroidism Morbid obesity  Renal/GU negative Renal ROS  negative genitourinary   Musculoskeletal negative musculoskeletal ROS (+)   Abdominal   Peds  Hematology negative hematology ROS (+)   Anesthesia Other Findings   Reproductive/Obstetrics                           Anesthesia Physical Anesthesia Plan  ASA: III  Anesthesia Plan: General   Post-op Pain Management:    Induction: Intravenous  Airway Management Planned: Oral ETT  Additional Equipment:   Intra-op Plan:   Post-operative Plan: Extubation in OR  Informed Consent: I have reviewed the patients History and Physical, chart, labs and discussed the procedure including the risks, benefits and alternatives for the proposed anesthesia with the patient or authorized representative who has indicated his/her understanding and acceptance.   Dental advisory given  Plan Discussed with: CRNA  Anesthesia Plan Comments:         Anesthesia Quick Evaluation

## 2013-11-08 NOTE — H&P (View-Only) (Signed)
Chief Complaint:  Weight regain and failure to sustain long-term weight loss with lap band  History of Present Illness:  Lisa Horne is an 38 y.o. female 2 headaches 10 cm lap band placed 10 years ago in February with a preop weight of 322 by February of 2012 she was down to 233 which would be over 90 pound weight loss. She maintained at 06/11/2011 but has had weight regain. We have had discussions in the past about converting her from a lap band to a bypass and down to a sleeve gastrectomy. After a lot of research she would like to convert this to a sleeve. I told her that we may do it at the same time as removal of lap band for do it in  stages depending on what we find.  She is scheduled for conversion next Tuesday.      Past Medical History  Diagnosis Date  . Thyroid disease     hypothyroidism  . Morbid obesity   . PCOS (polycystic ovarian syndrome)   . PONV (postoperative nausea and vomiting)   . GERD (gastroesophageal reflux disease)     Past Surgical History  Procedure Laterality Date  . Gastric restriction surgery      lap band 2005  . Gastric banding port revision      Current Outpatient Prescriptions  Medication Sig Dispense Refill  . cyclobenzaprine (FLEXERIL) 10 MG tablet Take 10 mg by mouth 3 (three) times daily as needed for muscle spasms.      Marland Kitchen esomeprazole (NEXIUM) 20 MG capsule Take 20 mg by mouth daily with breakfast.       . ibuprofen (ADVIL,MOTRIN) 200 MG tablet Take 800 mg by mouth every 6 (six) hours as needed for mild pain or moderate pain.      Marland Kitchen levonorgestrel (MIRENA) 20 MCG/24HR IUD 1 each by Intrauterine route once.      Marland Kitchen levothyroxine (SYNTHROID, LEVOTHROID) 200 MCG tablet Take 200 mcg by mouth daily before breakfast.      . metFORMIN (GLUCOPHAGE) 500 MG tablet Take 500 mg by mouth 2 (two) times daily with a meal.      . Multiple Vitamin (MULTIVITAMIN) capsule Take 1 capsule by mouth daily.      Marland Kitchen HYDROcodone-acetaminophen (HYCET) 7.5-325 mg/15 ml  solution Take 10 mLs by mouth 4 (four) times daily as needed for moderate pain.  300 mL  0  . [DISCONTINUED] diphenhydrAMINE (BENADRYL) 25 MG tablet Take 25 mg by mouth every 6 (six) hours as needed for itching.       No current facility-administered medications for this visit.   Ciprofloxacin and Hydrochlorothiazide w-triamterene Family History  Problem Relation Age of Onset  . Diabetes Father   . Cancer Father     skin  . Hypertension Mother   . Cancer Mother     breast  . Depression Mother   . Depression Sister    Social History:   reports that she has never smoked. She has never used smokeless tobacco. She reports that she does not drink alcohol or use illicit drugs.   REVIEW OF SYSTEMS - PERTINENT POSITIVES ONLY:   Physical Exam:   Blood pressure 120/70, pulse 76, temperature 97.6 F (36.4 C), height 5\' 8"  (1.727 m), weight 319 lb (144.697 kg), last menstrual period 11/01/2007. Body mass index is 48.51 kg/(m^2).  Gen:  WDWN white female NAD  Neurological: Alert and oriented to person, place, and time. Motor and sensory function is grossly intact  Head: Normocephalic  and atraumatic.  Eyes: Conjunctivae are normal. Pupils are equal, round, and reactive to light. No scleral icterus.  Neck: Normal range of motion. Neck supple. No tracheal deviation or thyromegaly present.  Cardiovascular:  SR without murmurs or gallops.  No carotid bruits Respiratory: Effort normal.  No respiratory distress. No chest wall tenderness. Breath sounds normal.  No wheezes, rales or rhonchi.  Abdomen:  nontender with band in place. GU: Musculoskeletal: Normal range of motion. Extremities are nontender. No cyanosis, edema or clubbing noted Lymphadenopathy: No cervical, preauricular, postauricular or axillary adenopathy is present Skin: Skin is warm and dry. No rash noted. No diaphoresis. No erythema. No pallor. Pscyh: Normal mood and affect. Behavior is normal. Judgment and thought content normal.    LABORATORY RESULTS: No results found for this or any previous visit (from the past 48 hour(s)).  RADIOLOGY RESULTS: No results found.  Problem List: Patient Active Problem List   Diagnosis Date Noted  . Obesity 12/10/2011  . Lapband 10 cm Feb 2005 06/13/2011  . BACK PAIN, LUMBAR 04/30/2010  . SPRAIN&STRAIN OF UNSPECIFIED SITE OF HIP&THIGH 11/09/2009  . HYPOTHYROIDISM 12/02/2006  . HYPERTENSION 12/02/2006    Assessment & Plan: Failure of lap band to maintain weight loss. Removal of lapband and conversion to sleeve gastrectomy.  She is aware that this may be a one or two stage procedure.      Matt B. Daphine DeutscherMartin, MD, [facility]  [facility] [phone] beeper [phone]  11/03/2013 9:45 AM

## 2013-11-09 ENCOUNTER — Inpatient Hospital Stay (HOSPITAL_COMMUNITY): Payer: Managed Care, Other (non HMO)

## 2013-11-09 LAB — CBC WITH DIFFERENTIAL/PLATELET
BASOS ABS: 0 10*3/uL (ref 0.0–0.1)
BASOS PCT: 0 % (ref 0–1)
Eosinophils Absolute: 0 10*3/uL (ref 0.0–0.7)
Eosinophils Relative: 0 % (ref 0–5)
HCT: 40.5 % (ref 36.0–46.0)
Hemoglobin: 13.4 g/dL (ref 12.0–15.0)
Lymphocytes Relative: 4 % — ABNORMAL LOW (ref 12–46)
Lymphs Abs: 0.6 10*3/uL — ABNORMAL LOW (ref 0.7–4.0)
MCH: 29.3 pg (ref 26.0–34.0)
MCHC: 33.1 g/dL (ref 30.0–36.0)
MCV: 88.6 fL (ref 78.0–100.0)
MONO ABS: 1.1 10*3/uL — AB (ref 0.1–1.0)
Monocytes Relative: 7 % (ref 3–12)
NEUTROS ABS: 14.2 10*3/uL — AB (ref 1.7–7.7)
Neutrophils Relative %: 89 % — ABNORMAL HIGH (ref 43–77)
Platelets: 237 10*3/uL (ref 150–400)
RBC: 4.57 MIL/uL (ref 3.87–5.11)
RDW: 13.3 % (ref 11.5–15.5)
WBC: 16 10*3/uL — ABNORMAL HIGH (ref 4.0–10.5)

## 2013-11-09 LAB — HEMOGLOBIN AND HEMATOCRIT, BLOOD
HCT: 38.5 % (ref 36.0–46.0)
Hemoglobin: 12.8 g/dL (ref 12.0–15.0)

## 2013-11-09 MED ORDER — IOHEXOL 300 MG/ML  SOLN
50.0000 mL | Freq: Once | INTRAMUSCULAR | Status: AC | PRN
Start: 2013-11-09 — End: 2013-11-09
  Administered 2013-11-09: 50 mL via ORAL

## 2013-11-09 MED ORDER — PROMETHAZINE HCL 25 MG/ML IJ SOLN
12.5000 mg | Freq: Four times a day (QID) | INTRAMUSCULAR | Status: DC | PRN
  Administered 2013-11-09 – 2013-11-12 (×10): 12.5 mg via INTRAVENOUS
  Filled 2013-11-09 (×10): qty 1

## 2013-11-09 NOTE — Progress Notes (Signed)
Patient ID: Lisa Horne, female   DOB: April 07, 1976, 38 y.o.   MRN: [id number] [facility] Progress Note:   1 Day Post-Op  Subjective: Mental status is clear Objective: Vital signs in last 24 hours: Temp:  [97.8 F (36.6 C)-100.1 F (37.8 C)] 99.4 F (37.4 C) (07/22 1000) Pulse Rate:  [64-74] 64 (07/22 1000) Resp:  [14-20] 18 (07/22 1000) BP: (103-161)/(60-92) 134/71 mmHg (07/22 1000) SpO2:  [96 %-100 %] 98 % (07/22 1000)  Intake/Output from previous day: 07/21 0701 - 07/22 0700 In: 3000 [I.V.:3000] Out: 2060 [Urine:2060] Intake/Output this shift: Total I/O In: -  Out: 300 [Urine:300]  Physical Exam: Work of breathing is normal.  Nauseated  Lab Results:  Results for orders placed during the hospital encounter of 11/08/13 (from the past 48 hour(s))  PREGNANCY, URINE     Status: None   Collection Time    11/08/13  9:05 AM      Result Value Ref Range   Preg Test, Ur NEGATIVE  NEGATIVE   Comment:            THE SENSITIVITY OF THIS     METHODOLOGY IS >20 mIU/mL.  HEMOGLOBIN AND HEMATOCRIT, BLOOD     Status: None   Collection Time    11/08/13  3:55 PM      Result Value Ref Range   Hemoglobin 13.2  12.0 - 15.0 g/dL   HCT 39.8  36.0 - 46.0 %  GLUCOSE, CAPILLARY     Status: Abnormal   Collection Time    11/08/13  5:11 PM      Result Value Ref Range   Glucose-Capillary 132 (*) 70 - 99 mg/dL  CBC     Status: Abnormal   Collection Time    11/08/13  5:19 PM      Result Value Ref Range   WBC 15.0 (*) 4.0 - 10.5 K/uL   RBC 4.69  3.87 - 5.11 MIL/uL   Hemoglobin 13.7  12.0 - 15.0 g/dL   HCT 41.5  36.0 - 46.0 %   MCV 88.5  78.0 - 100.0 fL   MCH 29.2  26.0 - 34.0 pg   MCHC 33.0  30.0 - 36.0 g/dL   RDW 13.4  11.5 - 15.5 %   Platelets 230  150 - 400 K/uL  CREATININE, SERUM     Status: Abnormal   Collection Time    11/08/13  5:19 PM      Result Value Ref Range   Creatinine, Ser 0.84  0.50 - 1.10 mg/dL   GFR calc non Af Amer 86 (*) >90 mL/min   GFR calc Af  Amer >90  >90 mL/min   Comment: (NOTE)     The eGFR has been calculated using the CKD EPI equation.     This calculation has not been validated in all clinical situations.     eGFR's persistently <90 mL/min signify possible Chronic Kidney     Disease.  CBC WITH DIFFERENTIAL     Status: Abnormal   Collection Time    11/09/13  5:25 AM      Result Value Ref Range   WBC 16.0 (*) 4.0 - 10.5 K/uL   RBC 4.57  3.87 - 5.11 MIL/uL   Hemoglobin 13.4  12.0 - 15.0 g/dL   HCT 40.5  36.0 - 46.0 %   MCV 88.6  78.0 - 100.0 fL   MCH 29.3  26.0 - 34.0 pg   MCHC 33.1  30.0 - 36.0 g/dL  RDW 13.3  11.5 - 15.5 %   Platelets 237  150 - 400 K/uL   Neutrophils Relative % 89 (*) 43 - 77 %   Neutro Abs 14.2 (*) 1.7 - 7.7 K/uL   Lymphocytes Relative 4 (*) 12 - 46 %   Lymphs Abs 0.6 (*) 0.7 - 4.0 K/uL   Monocytes Relative 7  3 - 12 %   Monocytes Absolute 1.1 (*) 0.1 - 1.0 K/uL   Eosinophils Relative 0  0 - 5 %   Eosinophils Absolute 0.0  0.0 - 0.7 K/uL   Basophils Relative 0  0 - 1 %   Basophils Absolute 0.0  0.0 - 0.1 K/uL    Radiology/Results: Dg Ugi W/water Sol Cm  11/09/2013   CLINICAL DATA:  38 year old female status post gastric band removal and creation of gastric sleeve. Initial encounter.  EXAM: WATER SOLUBLE UPPER GI SERIES  TECHNIQUE: Single-column upper GI series was performed using water soluble contrast.  CONTRAST:  63m OMNIPAQUE IOHEXOL 300 MG/ML  SOLN  COMPARISON:  Preoperative upper GI 07/01/2011.  FLUOROSCOPY TIME:  1 min 48 seconds.  FINDINGS: Preprocedural scout view of the abdomen. Lap band hardware no longer present. Subtle staple line in the left upper quadrant. Non obstructed bowel gas pattern.  The patient was nauseated, but tolerated small sepsis of water-soluble contrast. No obstruction to the flow of contrast to the distal esophagus and into the stomach. Prompt filling of the proximal stomach. After short delay contrast began transiting to the distal stomach, and into the duodenum C  loop on the final picture. No obstruction or leak identified.  IMPRESSION: No adverse features identified status post gastric band removal and gastric sleeve creation.   Electronically Signed   By: LLars PinksM.D.   On: 11/09/2013 10:18    Anti-infectives: Anti-infectives   Start     Dose/Rate Route Frequency Ordered Stop   11/08/13 0904  cefOXitin (MEFOXIN) 2 g in dextrose 5 % 50 mL IVPB     2 g 100 mL/hr over 30 Minutes Intravenous On call to O.R. 11/08/13 0904 11/08/13 1206      Assessment/Plan: Problem List: Patient Active Problem List   Diagnosis Date Noted  . Obesity 12/10/2011  . 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 BACK PAIN, LUMBAR 04/30/2010  . SPRAIN&STRAIN OF UNSPECIFIED SITE OF HIP&THIGH 11/09/2009  . HYPOTHYROIDISM 12/02/2006  . HYPERTENSION 12/02/2006    UGI OK.  Nausea better with phenergan.  Will continue observation 1 Day Post-Op    LOS: 1 day   Matt B. MHassell Done MD, [facility] [phone] [phone] 11/09/2013 1:47 PM

## 2013-11-09 NOTE — Anesthesia Postprocedure Evaluation (Signed)
Anesthesia Post Note  Patient: Lisa HatchetBobbie G Horne  Procedure(s) Performed: Procedure(s) (LRB): LAPAROSCOPIC GASTRIC BAND AND PORT REMOVAL WITH LAPAROSCOPIC GASTRIC SLEEVE RESECTION (N/A)  Anesthesia type: General  Patient location: PACU  Post pain: Pain level controlled  Post assessment: Post-op Vital signs reviewed  Last Vitals:  Filed Vitals:   11/09/13 0823  BP:   Pulse:   Temp: 37.5 C  Resp:     Post vital signs: Reviewed  Level of consciousness: sedated  Complications: No apparent anesthesia complications

## 2013-11-09 NOTE — Progress Notes (Signed)
Nutrition Education Note  Received consult for diet education per DROP protocol.   Discussed 2 week post op diet with pt. Emphasized that liquids must be non carbonated, non caffeinated, and sugar free. Fluid goals discussed. Pt to follow up with outpatient bariatric RD for further diet progression after 2 weeks. Multivitamins and minerals also reviewed. Teach back method used, pt expressed understanding, expect good compliance. Pt c/o nausea and said she had dry heaves all night, notified RN.    Diet: First 2 Weeks  You will see the nutritionist about two (2) weeks after your surgery. The nutritionist will increase the types of foods you can eat if you are handling liquids well:  If you have severe vomiting or nausea and cannot handle clear liquids lasting longer than 1 day, call your surgeon  Protein Shake  Drink at least 2 ounces of shake 5-6 times per day  Each serving of protein shakes (usually 8 - 12 ounces) should have a minimum of:  15 grams of protein  And no more than 5 grams of carbohydrate  Goal for protein each day:  Men = 80 grams per day  Women = 60 grams per day  Protein powder may be added to fluids such as non-fat milk or Lactaid milk or Soy milk (limit to 35 grams added protein powder per serving)   Hydration  Slowly increase the amount of water and other clear liquids as tolerated (See Acceptable Fluids)  Slowly increase the amount of protein shake as tolerated  Sip fluids slowly and throughout the day  May use sugar substitutes in small amounts (no more than 6 - 8 packets per day; i.e. Splenda)   Fluid Goal  The first goal is to drink at least 8 ounces of protein shake/drink per day (or as directed by the nutritionist); some examples of protein shakes are [employer] Nectar, Dillard'sdkins Advantage, EAS Edge HP, and Unjury. See handout from pre-op Bariatric Education Class:  Slowly increase the amount of protein shake you drink as tolerated  You may find it easier to slowly sip  shakes throughout the day  It is important to get your proteins in first  Your fluid goal is to drink 64 - 100 ounces of fluid daily  It may take a few weeks to build up to this  32 oz (or more) should be clear liquids  And  32 oz (or more) should be full liquids (see below for examples)  Liquids should not contain sugar, caffeine, or carbonation   Clear Liquids:  Water or Sugar-free flavored water (i.e. Fruit H2O, Propel)  Decaffeinated coffee or tea (sugar-free)  Crystal Lite, Wyler's Lite, Minute Maid Lite  Sugar-free Jell-O  Bouillon or broth  Sugar-free Popsicle: *Less than 20 calories each; Limit 1 per day   Full Liquids:  Protein Shakes/Drinks + 2 choices per day of other full liquids  Full liquids must be:  No More Than 12 grams of Carbs per serving  No More Than 3 grams of Fat per serving  Strained low-fat cream soup  Non-Fat milk  Fat-free Lactaid Milk  Sugar-free yogurt (Dannon Lite & Fit, Greek yogurt)     Spring GreenHeather Kamori Barbier MS, RD, [place] [phone] Pager [phone] Weekend/After Hours Pager

## 2013-11-10 ENCOUNTER — Telehealth (HOSPITAL_COMMUNITY): Payer: Self-pay | Admitting: *Deleted

## 2013-11-10 LAB — CBC WITH DIFFERENTIAL/PLATELET
Basophils Absolute: 0 10*3/uL (ref 0.0–0.1)
Basophils Relative: 0 % (ref 0–1)
EOS ABS: 0 10*3/uL (ref 0.0–0.7)
Eosinophils Relative: 0 % (ref 0–5)
HEMATOCRIT: 38.3 % (ref 36.0–46.0)
Hemoglobin: 12.5 g/dL (ref 12.0–15.0)
LYMPHS ABS: 1.4 10*3/uL (ref 0.7–4.0)
Lymphocytes Relative: 11 % — ABNORMAL LOW (ref 12–46)
MCH: 28.9 pg (ref 26.0–34.0)
MCHC: 32.6 g/dL (ref 30.0–36.0)
MCV: 88.7 fL (ref 78.0–100.0)
MONO ABS: 1.1 10*3/uL — AB (ref 0.1–1.0)
Monocytes Relative: 9 % (ref 3–12)
NEUTROS ABS: 10 10*3/uL — AB (ref 1.7–7.7)
Neutrophils Relative %: 80 % — ABNORMAL HIGH (ref 43–77)
Platelets: 223 10*3/uL (ref 150–400)
RBC: 4.32 MIL/uL (ref 3.87–5.11)
RDW: 13.5 % (ref 11.5–15.5)
WBC: 12.5 10*3/uL — ABNORMAL HIGH (ref 4.0–10.5)

## 2013-11-10 NOTE — Progress Notes (Signed)
Patient ID: Lisa Horne, female   DOB: [birth year], 38 y.o.   MRN: [id number] [facility] Progress Note:   2 Days Post-Op  Subjective: Mental status is clear.  Less nauseated.   Objective: Vital signs in last 24 hours: Temp:  [99.1 F (37.3 C)-99.5 F (37.5 C)] 99.2 F (37.3 C) (07/23 1157) Pulse Rate:  [59-68] 68 (07/23 1157) Resp:  [18] 18 (07/23 1157) BP: (106-152)/(49-118) 130/62 mmHg (07/23 1207) SpO2:  [97 %-99 %] 99 % (07/23 1157) Weight:  [319 lb 3.6 oz (144.8 kg)] 319 lb 3.6 oz (144.8 kg) (07/23 0523)  Intake/Output from previous day: 07/22 0701 - 07/23 0700 In: 3685 [I.V.:3685] Out: 3550 [Urine:3550] Intake/Output this shift: Total I/O In: 821.7 [I.V.:821.7] Out: 500 [Urine:500]  Physical Exam: Work of breathing is not elevated.  Minimal pain.    Lab Results:  Results for orders placed during the hospital encounter of 11/08/13 (from the past 48 hour(s))  HEMOGLOBIN AND HEMATOCRIT, BLOOD     Status: None   Collection Time    11/08/13  3:55 PM      Result Value Ref Range   Hemoglobin 13.2  12.0 - 15.0 g/dL   HCT 39.8  36.0 - 46.0 %  GLUCOSE, CAPILLARY     Status: Abnormal   Collection Time    11/08/13  5:11 PM      Result Value Ref Range   Glucose-Capillary 132 (*) 70 - 99 mg/dL  CBC     Status: Abnormal   Collection Time    11/08/13  5:19 PM      Result Value Ref Range   WBC 15.0 (*) 4.0 - 10.5 K/uL   RBC 4.69  3.87 - 5.11 MIL/uL   Hemoglobin 13.7  12.0 - 15.0 g/dL   HCT 41.5  36.0 - 46.0 %   MCV 88.5  78.0 - 100.0 fL   MCH 29.2  26.0 - 34.0 pg   MCHC 33.0  30.0 - 36.0 g/dL   RDW 13.4  11.5 - 15.5 %   Platelets 230  150 - 400 K/uL  CREATININE, SERUM     Status: Abnormal   Collection Time    11/08/13  5:19 PM      Result Value Ref Range   Creatinine, Ser 0.84  0.50 - 1.10 mg/dL   GFR calc non Af Amer 86 (*) >90 mL/min   GFR calc Af Amer >90  >90 mL/min   Comment: (NOTE)     The eGFR has been calculated using the CKD EPI equation.      This calculation has not been validated in all clinical situations.     eGFR's persistently <90 mL/min signify possible Chronic Kidney     Disease.  CBC WITH DIFFERENTIAL     Status: Abnormal   Collection Time    11/09/13  5:25 AM      Result Value Ref Range   WBC 16.0 (*) 4.0 - 10.5 K/uL   RBC 4.57  3.87 - 5.11 MIL/uL   Hemoglobin 13.4  12.0 - 15.0 g/dL   HCT 40.5  36.0 - 46.0 %   MCV 88.6  78.0 - 100.0 fL   MCH 29.3  26.0 - 34.0 pg   MCHC 33.1  30.0 - 36.0 g/dL   RDW 13.3  11.5 - 15.5 %   Platelets 237  150 - 400 K/uL   Neutrophils Relative % 89 (*) 43 - 77 %   Neutro Abs 14.2 (*) 1.7 - 7.7  K/uL   Lymphocytes Relative 4 (*) 12 - 46 %   Lymphs Abs 0.6 (*) 0.7 - 4.0 K/uL   Monocytes Relative 7  3 - 12 %   Monocytes Absolute 1.1 (*) 0.1 - 1.0 K/uL   Eosinophils Relative 0  0 - 5 %   Eosinophils Absolute 0.0  0.0 - 0.7 K/uL   Basophils Relative 0  0 - 1 %   Basophils Absolute 0.0  0.0 - 0.1 K/uL  HEMOGLOBIN AND HEMATOCRIT, BLOOD     Status: None   Collection Time    11/09/13  4:01 PM      Result Value Ref Range   Hemoglobin 12.8  12.0 - 15.0 g/dL   HCT 38.5  36.0 - 46.0 %  CBC WITH DIFFERENTIAL     Status: Abnormal   Collection Time    11/10/13  5:15 AM      Result Value Ref Range   WBC 12.5 (*) 4.0 - 10.5 K/uL   RBC 4.32  3.87 - 5.11 MIL/uL   Hemoglobin 12.5  12.0 - 15.0 g/dL   HCT 38.3  36.0 - 46.0 %   MCV 88.7  78.0 - 100.0 fL   MCH 28.9  26.0 - 34.0 pg   MCHC 32.6  30.0 - 36.0 g/dL   RDW 13.5  11.5 - 15.5 %   Platelets 223  150 - 400 K/uL   Neutrophils Relative % 80 (*) 43 - 77 %   Lymphocytes Relative 11 (*) 12 - 46 %   Monocytes Relative 9  3 - 12 %   Eosinophils Relative 0  0 - 5 %   Basophils Relative 0  0 - 1 %   Neutro Abs 10.0 (*) 1.7 - 7.7 K/uL   Lymphs Abs 1.4  0.7 - 4.0 K/uL   Monocytes Absolute 1.1 (*) 0.1 - 1.0 K/uL   Eosinophils Absolute 0.0  0.0 - 0.7 K/uL   Basophils Absolute 0.0  0.0 - 0.1 K/uL   Smear Review MORPHOLOGY UNREMARKABLE       Radiology/Results: Dg Ugi W/water Sol Cm  11/09/2013   CLINICAL DATA:  38 year old female status post gastric band removal and creation of gastric sleeve. Initial encounter.  EXAM: WATER SOLUBLE UPPER GI SERIES  TECHNIQUE: Single-column upper GI series was performed using water soluble contrast.  CONTRAST:  76m OMNIPAQUE IOHEXOL 300 MG/ML  SOLN  COMPARISON:  Preoperative upper GI 07/01/2011.  FLUOROSCOPY TIME:  1 min 48 seconds.  FINDINGS: Preprocedural scout view of the abdomen. Lap band hardware no longer present. Subtle staple line in the left upper quadrant. Non obstructed bowel gas pattern.  The patient was nauseated, but tolerated small sepsis of water-soluble contrast. No obstruction to the flow of contrast to the distal esophagus and into the stomach. Prompt filling of the proximal stomach. After short delay contrast began transiting to the distal stomach, and into the duodenum C loop on the final picture. No obstruction or leak identified.  IMPRESSION: No adverse features identified status post gastric band removal and gastric sleeve creation.   Electronically Signed   By: LLars PinksM.D.   On: 11/09/2013 10:18    Anti-infectives: Anti-infectives   Start     Dose/Rate Route Frequency Ordered Stop   11/08/13 0904  cefOXitin (MEFOXIN) 2 g in dextrose 5 % 50 mL IVPB     2 g 100 mL/hr over 30 Minutes Intravenous On call to O.R. 11/08/13 0904 11/08/13 1206      Assessment/Plan:  Problem List: Patient Active Problem List   Diagnosis Date Noted  . Obesity 12/10/2011  . 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 BACK PAIN, LUMBAR 04/30/2010  . SPRAIN&STRAIN OF UNSPECIFIED SITE OF HIP&THIGH 11/09/2009  . HYPOTHYROIDISM 12/02/2006  . HYPERTENSION 12/02/2006    Will be starting liquids PO today.  Not ready for discharge.   2 Days Post-Op    LOS: 2 days   Matt B. Hassell Done, MD, [facility] [phone]  beeper [phone]  11/10/2013 2:46 PM

## 2013-11-10 NOTE — Progress Notes (Signed)
Patient alert and oriented, Post op day 2.  Provided support and encouragement. Pain controlled. Nausea much better "100% improved" per Patient. Encouraged pulmonary toilet, ambulation and ice chips/sips of liquids.  All questions answered.  Will continue to monitor. Recommend keeping patient another day. Dr. Daphine DeutscherMartin notified.

## 2013-11-11 LAB — CBC WITH DIFFERENTIAL/PLATELET
Basophils Absolute: 0.1 10*3/uL (ref 0.0–0.1)
Basophils Relative: 1 % (ref 0–1)
Eosinophils Absolute: 0.1 10*3/uL (ref 0.0–0.7)
Eosinophils Relative: 1 % (ref 0–5)
HCT: 40.1 % (ref 36.0–46.0)
Hemoglobin: 13.5 g/dL (ref 12.0–15.0)
Lymphocytes Relative: 15 % (ref 12–46)
Lymphs Abs: 1.4 10*3/uL (ref 0.7–4.0)
MCH: 29.6 pg (ref 26.0–34.0)
MCHC: 33.7 g/dL (ref 30.0–36.0)
MCV: 87.9 fL (ref 78.0–100.0)
Monocytes Absolute: 1.1 10*3/uL — ABNORMAL HIGH (ref 0.1–1.0)
Monocytes Relative: 12 % (ref 3–12)
NEUTROS PCT: 71 % (ref 43–77)
Neutro Abs: 6.5 10*3/uL (ref 1.7–7.7)
PLATELETS: 216 10*3/uL (ref 150–400)
RBC: 4.56 MIL/uL (ref 3.87–5.11)
RDW: 13.2 % (ref 11.5–15.5)
WBC: 9.2 10*3/uL (ref 4.0–10.5)

## 2013-11-11 MED ORDER — HYDROCODONE-ACETAMINOPHEN 7.5-325 MG/15ML PO SOLN
10.0000 mL | ORAL | Status: DC | PRN
  Administered 2013-11-12: 10 mL via ORAL
  Filled 2013-11-11: qty 15

## 2013-11-11 NOTE — Discharge Instructions (Signed)

## 2013-11-11 NOTE — Progress Notes (Signed)
Patient ID: Lisa Horne, female   DOB: 05/15/1975, 38 y.o.   MRN: [id number] [facility] Progress Note:   3 Days Post-Op  Subjective: Mental status is clear Objective: Vital signs in last 24 hours: Temp:  [98.1 F (36.7 C)-99.3 F (37.4 C)] 98.1 F (36.7 C) (07/24 0532) Pulse Rate:  [58-81] 58 (07/24 0532) Resp:  [18] 18 (07/24 0532) BP: (111-152)/(55-118) 135/69 mmHg (07/24 0532) SpO2:  [96 %-100 %] 96 % (07/24 0532) Weight:  [314 lb 2.5 oz (142.5 kg)] 314 lb 2.5 oz (142.5 kg) (07/24 0700)  Intake/Output from previous day: 07/23 0701 - 07/24 0700 In: 2388.3 [I.V.:2388.3] Out: 4300 [Urine:4300] Intake/Output this shift:    Physical Exam: Work of breathing is normal; pain in left upper quadrant that feels like a muscle spasm.  Taking water and ice chips ok but not protein shakes.  Lab Results:  Results for orders placed during the hospital encounter of 11/08/13 (from the past 48 hour(s))  HEMOGLOBIN AND HEMATOCRIT, BLOOD     Status: None   Collection Time    11/09/13  4:01 PM      Result Value Ref Range   Hemoglobin 12.8  12.0 - 15.0 g/dL   HCT 13.238.5  44.036.0 - 10.246.0 %  CBC WITH DIFFERENTIAL     Status: Abnormal   Collection Time    11/10/13  5:15 AM      Result Value Ref Range   WBC 12.5 (*) 4.0 - 10.5 K/uL   RBC 4.32  3.87 - 5.11 MIL/uL   Hemoglobin 12.5  12.0 - 15.0 g/dL   HCT 72.538.3  36.636.0 - 44.046.0 %   MCV 88.7  78.0 - 100.0 fL   MCH 28.9  26.0 - 34.0 pg   MCHC 32.6  30.0 - 36.0 g/dL   RDW 34.713.5  42.511.5 - 95.615.5 %   Platelets 223  150 - 400 K/uL   Neutrophils Relative % 80 (*) 43 - 77 %   Lymphocytes Relative 11 (*) 12 - 46 %   Monocytes Relative 9  3 - 12 %   Eosinophils Relative 0  0 - 5 %   Basophils Relative 0  0 - 1 %   Neutro Abs 10.0 (*) 1.7 - 7.7 K/uL   Lymphs Abs 1.4  0.7 - 4.0 K/uL   Monocytes Absolute 1.1 (*) 0.1 - 1.0 K/uL   Eosinophils Absolute 0.0  0.0 - 0.7 K/uL   Basophils Absolute 0.0  0.0 - 0.1 K/uL   Smear Review MORPHOLOGY UNREMARKABLE       Radiology/Results: Dg Ugi W/water Sol Cm  11/09/2013   CLINICAL DATA:  38 year old female status post gastric band removal and creation of gastric sleeve. Initial encounter.  EXAM: WATER SOLUBLE UPPER GI SERIES  TECHNIQUE: Single-column upper GI series was performed using water soluble contrast.  CONTRAST:  50mL OMNIPAQUE IOHEXOL 300 MG/ML  SOLN  COMPARISON:  Preoperative upper GI 07/01/2011.  FLUOROSCOPY TIME:  1 min 48 seconds.  FINDINGS: Preprocedural scout view of the abdomen. Lap band hardware no longer present. Subtle staple line in the left upper quadrant. Non obstructed bowel gas pattern.  The patient was nauseated, but tolerated small sepsis of water-soluble contrast. No obstruction to the flow of contrast to the distal esophagus and into the stomach. Prompt filling of the proximal stomach. After short delay contrast began transiting to the distal stomach, and into the duodenum C loop on the final picture. No obstruction or leak identified.  IMPRESSION: No  adverse features identified status post gastric band removal and gastric sleeve creation.   Electronically Signed   By: Augusto Gamble M.D.   On: 11/09/2013 10:18    Anti-infectives: Anti-infectives   Start     Dose/Rate Route Frequency Ordered Stop   11/08/13 0904  cefOXitin (MEFOXIN) 2 g in dextrose 5 % 50 mL IVPB     2 g 100 mL/hr over 30 Minutes Intravenous On call to O.R. 11/08/13 0904 11/08/13 1206      Assessment/Plan: Problem List: Patient Active Problem List   Diagnosis Date Noted  . Obesity 12/10/2011  . 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 BACK PAIN, LUMBAR 04/30/2010  . SPRAIN&STRAIN OF UNSPECIFIED SITE OF HIP&THIGH 11/09/2009  . HYPOTHYROIDISM 12/02/2006  . HYPERTENSION 12/02/2006    Will check lab today.  Try unjury chicken broth shakes.  Not ready for discharge today.   3 Days Post-Op    LOS: 3 days   Matt B. Daphine Deutscher, MD, [facility] [phone] beeper [phone]  11/11/2013 8:54 AM

## 2013-11-12 LAB — CBC WITH DIFFERENTIAL/PLATELET
BASOS ABS: 0 10*3/uL (ref 0.0–0.1)
Basophils Relative: 0 % (ref 0–1)
EOS ABS: 0.1 10*3/uL (ref 0.0–0.7)
Eosinophils Relative: 1 % (ref 0–5)
HCT: 40.3 % (ref 36.0–46.0)
Hemoglobin: 13.2 g/dL (ref 12.0–15.0)
LYMPHS ABS: 1.8 10*3/uL (ref 0.7–4.0)
Lymphocytes Relative: 18 % (ref 12–46)
MCH: 28.7 pg (ref 26.0–34.0)
MCHC: 32.8 g/dL (ref 30.0–36.0)
MCV: 87.6 fL (ref 78.0–100.0)
Monocytes Absolute: 1.1 10*3/uL — ABNORMAL HIGH (ref 0.1–1.0)
Monocytes Relative: 11 % (ref 3–12)
NEUTROS PCT: 70 % (ref 43–77)
Neutro Abs: 6.6 10*3/uL (ref 1.7–7.7)
PLATELETS: 234 10*3/uL (ref 150–400)
RBC: 4.6 MIL/uL (ref 3.87–5.11)
RDW: 13.2 % (ref 11.5–15.5)
WBC: 9.5 10*3/uL (ref 4.0–10.5)

## 2013-11-12 NOTE — Progress Notes (Signed)
Patient ID: Lisa HatchetBobbie G Horne, female   DOB: 1975/09/30, 38 y.o.   MRN: [id number]  [facility] Progress Note  POD# 4   Subjective: Patient without complaints.  Anxious to go home today.  Denies nausea or emesis.  Tolerating diet.  Pain "controlled".  Passing flatus, no BM's yet.  Objective: Vital signs in last 24 hours: Temp:  [97.8 F (36.6 C)-98.6 F (37 C)] 98.6 F (37 C) (07/25 0530) Pulse Rate:  [62-67] 63 (07/25 0530) Resp:  [18] 18 (07/25 0530) BP: (110-143)/(65-79) 119/79 mmHg (07/25 0530) SpO2:  [95 %-97 %] 95 % (07/25 0530) Weight:  [311 lb (141.069 kg)] 311 lb (141.069 kg) (07/25 0700) Last BM Date: 11/06/13  Intake/Output from previous day: 07/24 0701 - 07/25 0700 In: 2267.1 [I.V.:2267.1] Out: 3900 [Urine:3900]  Exam: HEENT - clear, not icteric Neck - soft Chest - clear bilaterally Cor - RRR, no murmur Abd - soft, obese; wounds clear and dry and intact with Dermabond; BS active Ext - no significant edema Neuro - grossly intact, no focal deficits  Lab Results:   Recent Labs  11/11/13 1013 11/12/13 0554  WBC 9.2 9.5  HGB 13.5 13.2  HCT 40.1 40.3  PLT 216 234    No results found for this basename: NA, K, CL, CO2, GLUCOSE, BUN, CREATININE, CALCIUM,  in the last 72 hours  Studies/Results: No results found.  Assessment / Plan: 1.  Status post gastric sleeve, takedown of lap band  Discharge home today  Patient has pain Rx at home  Follow up with Dr. Daphine DeutscherMartin at [facility] office arranged  Velora Hecklerodd M. Demetri Kerman, MD, [facility] Office: [phone]  11/12/2013

## 2013-11-18 NOTE — Telephone Encounter (Signed)
Encounter opened in error

## 2013-11-22 ENCOUNTER — Ambulatory Visit: Payer: Managed Care, Other (non HMO)

## 2013-11-22 NOTE — Discharge Summary (Signed)
Physician Discharge Summary  Patient ID: Lisa Horne: [id number] DOB/AGE: [birth year] 38 y.o.  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  Much nausea postop which extended her stay.  When taking POs well was ready for discharge  Consults: none  Significant Diagnostic Studies: UGI    Discharge Exam: Blood pressure 116/80, pulse 69, temperature 98.6 F (37 C), temperature source Oral, resp. rate 18, height 5\' 8"  (1.727 m), weight 311 lb (141.069 kg), SpO2 95.00%. Incisions ok.  Disposition: 01-Home or Self Care  Discharge Instructions   Increase activity slowly    Complete by:  As directed      No dressing needed    Complete by:  As directed             Medication List         cyclobenzaprine 10 MG tablet  Commonly known as:  FLEXERIL  Take 10 mg by mouth 3 (three) times daily as needed for muscle spasms.     esomeprazole 20 MG capsule  Commonly known as:  NEXIUM  Take 20 mg by mouth daily with breakfast.     HYDROcodone-acetaminophen 7.5-325 mg/15 ml solution  Commonly known as:  HYCET  Take 10 mLs by mouth 4 (four) times daily as needed for moderate pain.     ibuprofen 200 MG tablet  Commonly known as:  ADVIL,MOTRIN  Take 800 mg by mouth every 6 (six) hours as needed for mild pain or moderate pain.     levonorgestrel 20 MCG/24HR IUD  Commonly known as:  MIRENA  1 each by Intrauterine route once.     levothyroxine 200 MCG tablet  Commonly known as:  SYNTHROID, LEVOTHROID  Take 200 mcg by mouth daily before breakfast.     metFORMIN 500 MG tablet  Commonly known as:  GLUCOPHAGE  Take 500 mg by mouth 2 (two) times daily with a meal.      multivitamin capsule  Take 1 capsule by mouth daily.           Follow-up Information   Follow up with Valarie MerinoMARTIN,Laqueisha Catalina B, MD.   Specialty:  General Surgery   Contact information:   [address] [place] [address] [phone]       Follow up with Valarie MerinoMARTIN,Jarquavious Fentress B, MD. Schedule an appointment as soon as possible for a visit in 3 weeks. (For wound re-check)    Specialty:  General Surgery   Contact information:   [address] [place] [address] [phone]       Signed: Valarie MerinoMARTIN,Shaianne Nucci B 11/22/2013, 9:35 AM

## 2013-12-01 ENCOUNTER — Encounter (INDEPENDENT_AMBULATORY_CARE_PROVIDER_SITE_OTHER): Payer: Self-pay | Admitting: Surgery

## 2013-12-01 ENCOUNTER — Ambulatory Visit (INDEPENDENT_AMBULATORY_CARE_PROVIDER_SITE_OTHER): Payer: Private Health Insurance - Indemnity | Admitting: Surgery

## 2013-12-01 VITALS — BP 124/76 | HR 76 | Temp 97.5°F | Ht 68.0 in | Wt 300.0 lb

## 2013-12-01 DIAGNOSIS — Z9884 Bariatric surgery status: Secondary | ICD-10-CM

## 2013-12-01 NOTE — Progress Notes (Signed)
Lisa HatchetBobbie G Horne 38 y.o.  Body mass index is 45.63 kg/(m^2).  Patient Active Problem List   Diagnosis Date Noted  . Obesity 12/10/2011  . 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 BACK PAIN, LUMBAR 04/30/2010  . SPRAIN&STRAIN OF UNSPECIFIED SITE OF HIP&THIGH 11/09/2009  . HYPOTHYROIDISM 12/02/2006  . HYPERTENSION 12/02/2006    Allergies  Allergen Reactions  . Ciprofloxacin     REACTION: severe headache/vomiting  . Hydrochlorothiazide W-Triamterene     REACTION: malaise      Past Surgical History  Procedure Laterality Date  . Gastric restriction surgery      lap band 2005  . Gastric banding port revision     FRY,STEPHEN A, MD No diagnosis found.  Doing very well.  Incisions OK. Down to right under 300 lbs on her scales.  Will see back in 3 months.  Nausea has resolved.    Matt B. Daphine DeutscherMartin, MD, [facility]  [facility] [phone] beeper [phone]  12/01/2013 11:44 AM

## 2014-01-18 ENCOUNTER — Telehealth: Payer: Self-pay | Admitting: Family Medicine

## 2014-01-18 MED ORDER — CYCLOBENZAPRINE HCL 10 MG PO TABS: 10.0000 mg | ORAL_TABLET | Freq: Three times a day (TID) | ORAL | Status: DC | PRN

## 2014-01-18 NOTE — Telephone Encounter (Signed)
done

## 2014-01-19 ENCOUNTER — Other Ambulatory Visit: Payer: Self-pay | Admitting: Family Medicine

## 2014-01-19 DIAGNOSIS — Z1231 Encounter for screening mammogram for malignant neoplasm of breast: Secondary | ICD-10-CM

## 2014-01-23 ENCOUNTER — Ambulatory Visit (INDEPENDENT_AMBULATORY_CARE_PROVIDER_SITE_OTHER): Payer: Managed Care, Other (non HMO)

## 2014-01-23 DIAGNOSIS — Z23 Encounter for immunization: Secondary | ICD-10-CM

## 2014-01-24 ENCOUNTER — Ambulatory Visit (HOSPITAL_COMMUNITY)
Admission: RE | Admit: 2014-01-24 | Discharge: 2014-01-24 | Disposition: A | Discharge status: Home / Self Care | Payer: Managed Care, Other (non HMO) | Source: Ambulatory Visit | Attending: Family Medicine | Admitting: Family Medicine

## 2014-01-24 DIAGNOSIS — Z1231 Encounter for screening mammogram for malignant neoplasm of breast: Secondary | ICD-10-CM

## 2014-03-15 ENCOUNTER — Ambulatory Visit (INDEPENDENT_AMBULATORY_CARE_PROVIDER_SITE_OTHER): Payer: Managed Care, Other (non HMO) | Admitting: Family Medicine

## 2014-03-15 ENCOUNTER — Encounter: Payer: Self-pay | Admitting: Family Medicine

## 2014-03-15 VITALS — BP 137/79 | HR 70 | Temp 98.4°F | Ht 68.0 in | Wt 275.0 lb

## 2014-03-15 DIAGNOSIS — J029 Acute pharyngitis, unspecified: Secondary | ICD-10-CM

## 2014-03-15 MED ORDER — CEPHALEXIN 500 MG PO CAPS: 500.0000 mg | ORAL_CAPSULE | Freq: Three times a day (TID) | ORAL | Status: AC

## 2014-03-15 NOTE — Progress Notes (Signed)
   Subjective:    Patient ID: Lisa Horne, female    DOB: March 14, 1976, 38 y.o.   MRN: [id number]  HPI Here for 10 days of fever to 102 degrees and a bad ST. No coughing. On Tylenol.    Review of Systems  Constitutional: Positive for fever.  HENT: Negative for congestion, postnasal drip and sinus pressure.   Eyes: Negative.   Respiratory: Negative.        Objective:   Physical Exam  Constitutional: She appears well-developed and well-nourished.  HENT:  Right Ear: External ear normal.  Left Ear: External ear normal.  Nose: Nose normal.  Posterior OP is red without exudate   Eyes: Conjunctivae are normal.  Neck: Neck supple.  Shotty tender AC nodes   Pulmonary/Chest: Effort normal and breath sounds normal.          Assessment & Plan:  Treat with Keflex

## 2014-03-15 NOTE — Progress Notes (Signed)
Pre visit review using our clinic review tool, if applicable. No additional management support is needed unless otherwise documented below in the visit note. 

## 2014-04-08 IMAGING — US US ABDOMEN COMPLETE
1 series · 14 of 25 positions shown · non-contrast
Comparison: None.

CLINICAL DATA: Pre bariatric exam.  Indigestion.  Morbid obesity.

ABDOMINAL ULTRASOUND COMPLETE

[Series 1: us abdomen complete · 14 of 66 slices shown]
[im 1/66]
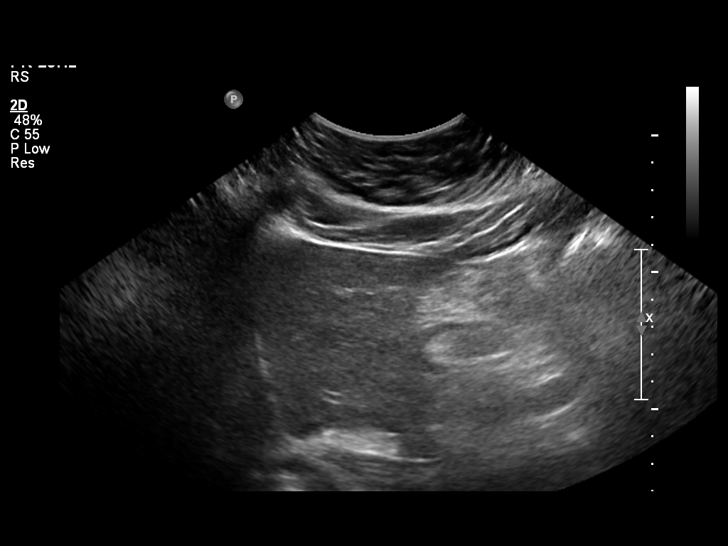
[im 6/66]
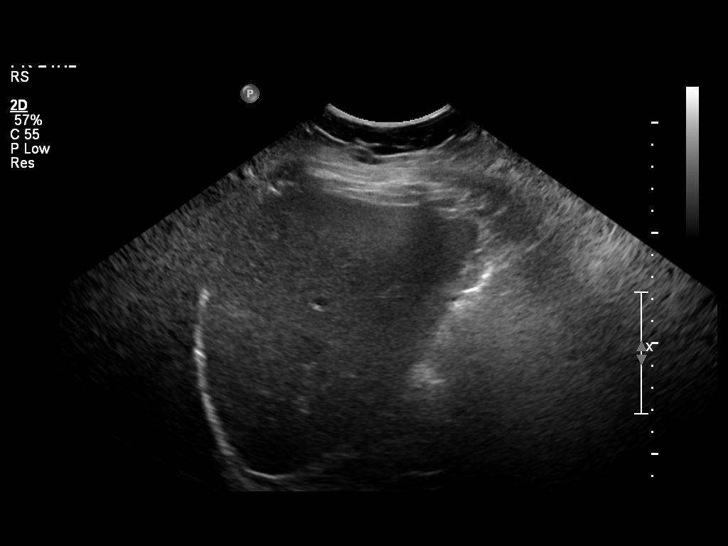
[im 11/66]
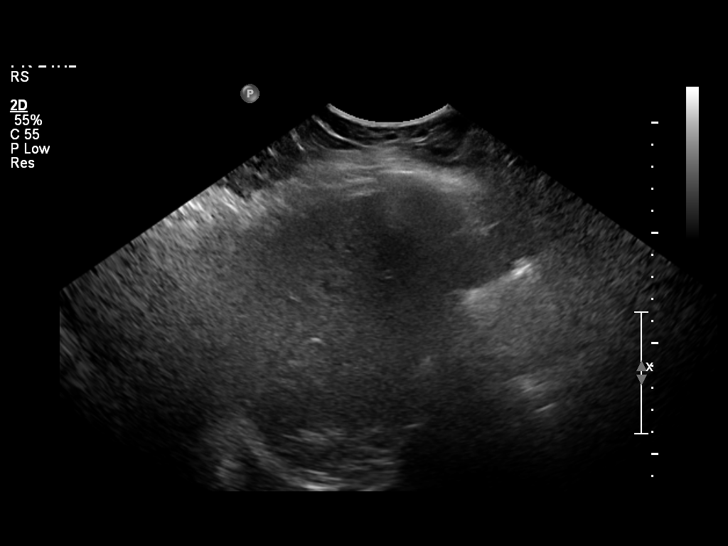
[im 17/66]
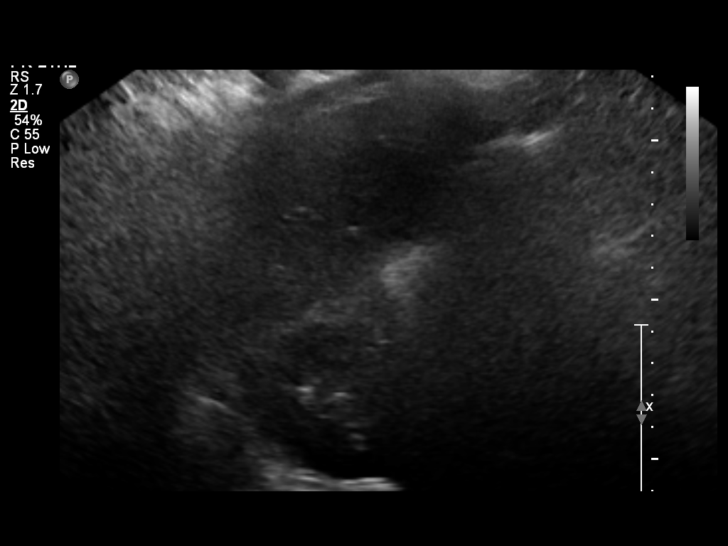
[im 22/66]
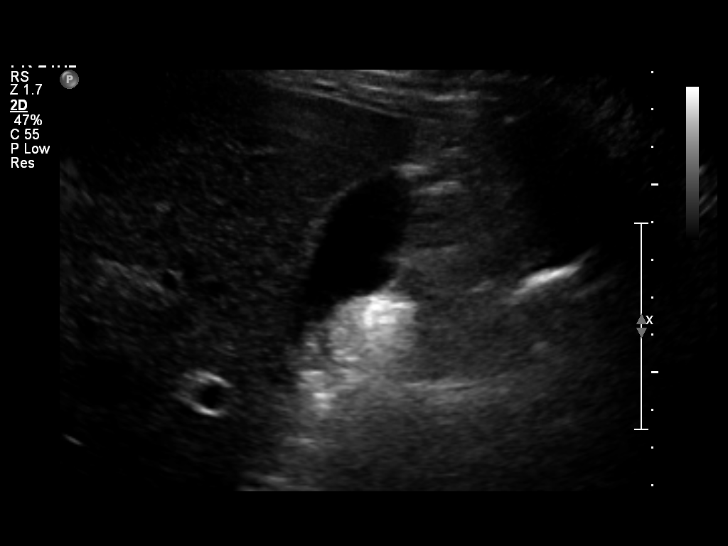
[im 25/66]
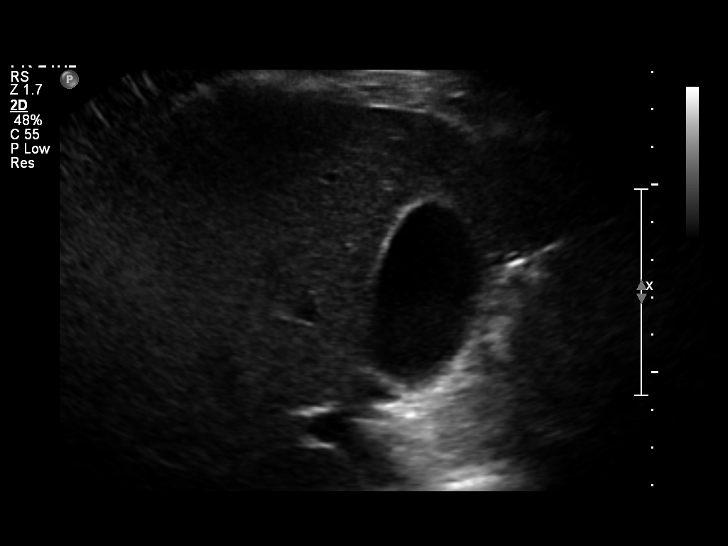
[im 30/66]
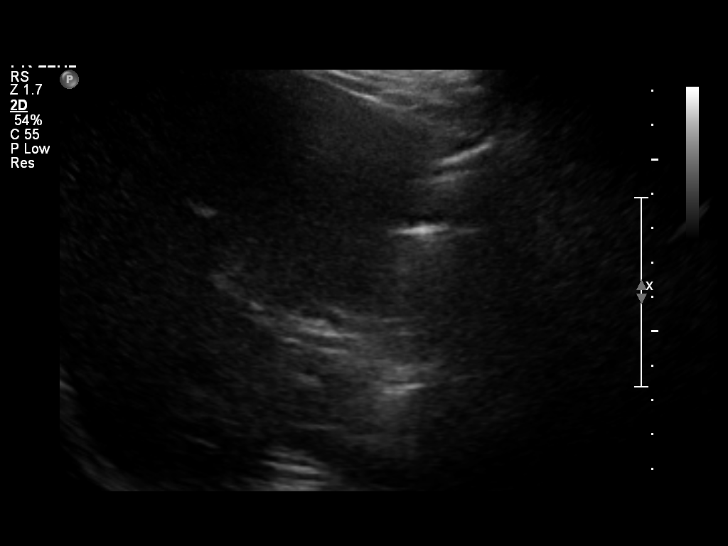
[im 36/66]
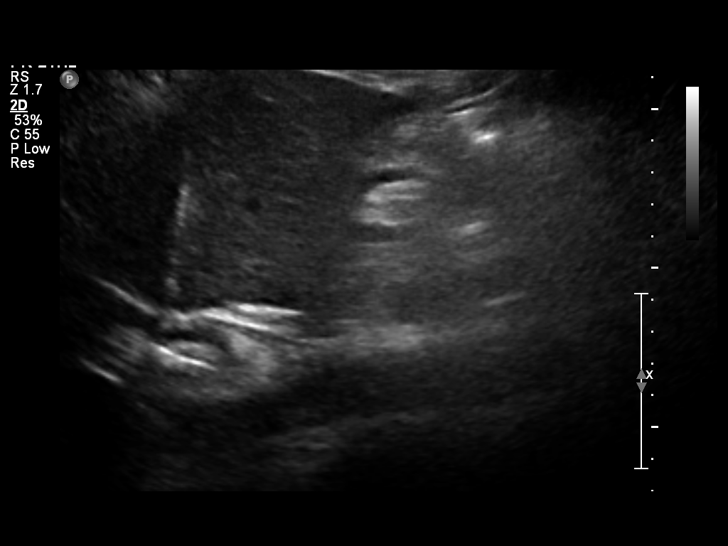
[im 41/66]
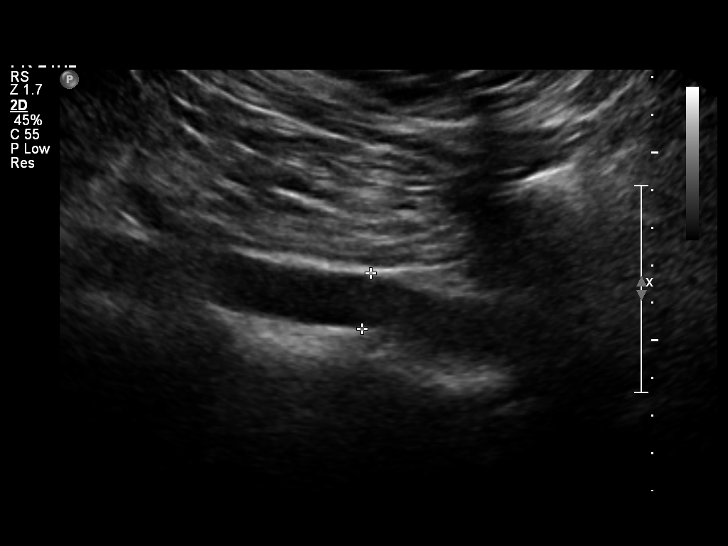
[im 44/66]
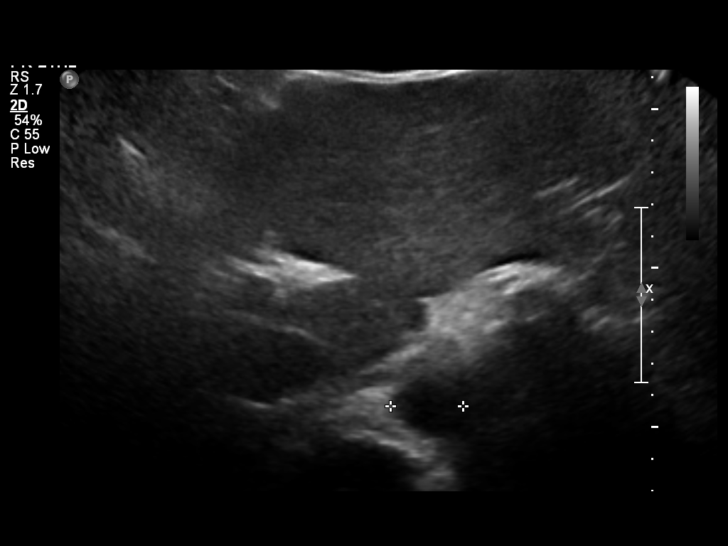
[im 49/66]
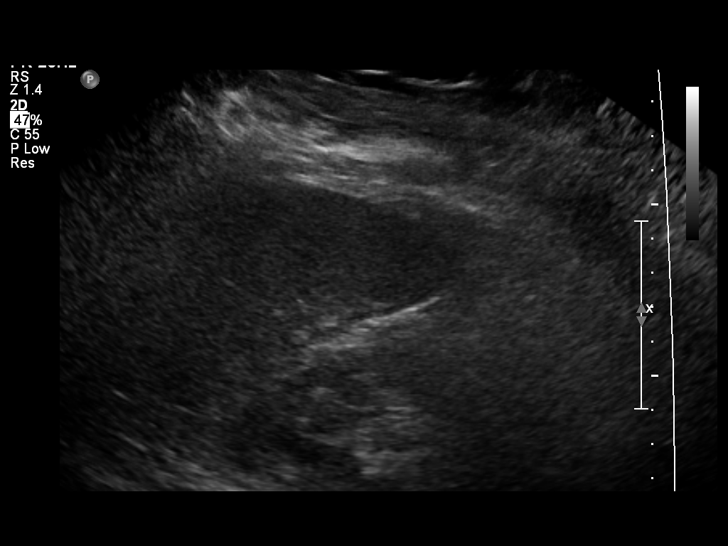
[im 55/66]
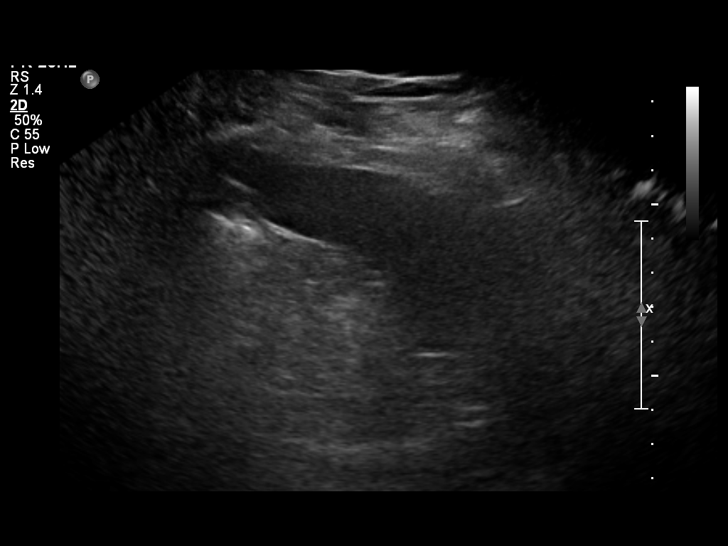
[im 60/66]
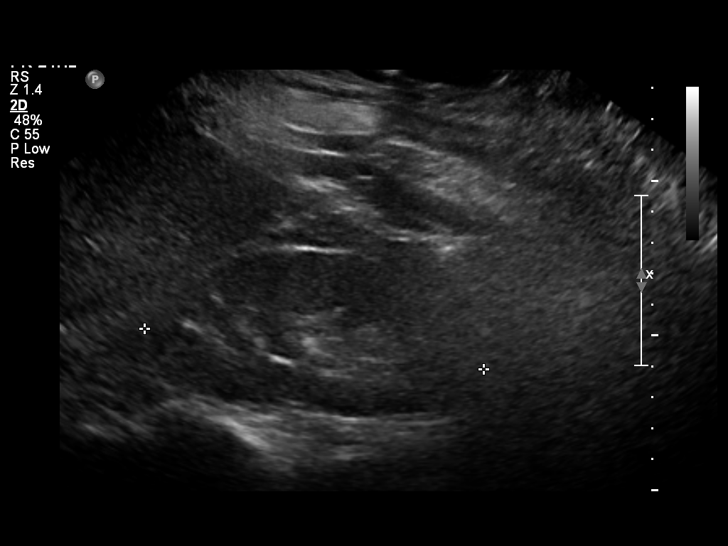
[im 66/66]
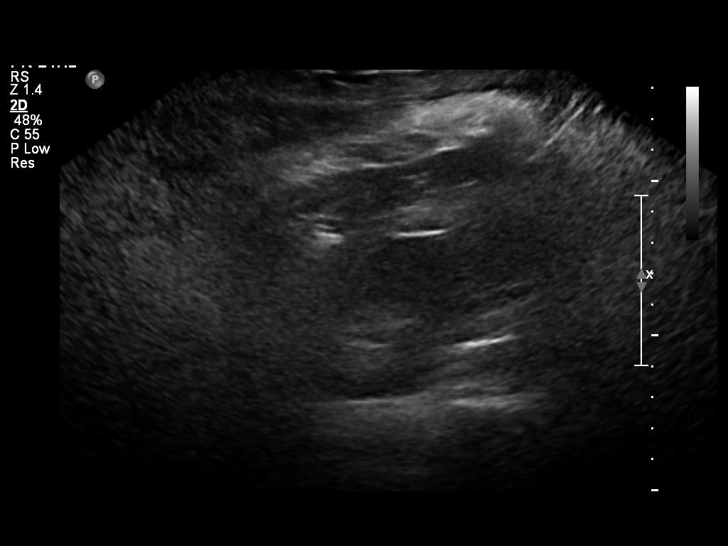

[14 of 25 positions shown; findings below may reference images not displayed]

FINDINGS: Gallbladder:  No gallstones, gallbladder wall thickening, or
pericholecystic fluid.

Common Bile Duct:  Within normal limits in caliber.

Liver: No focal mass lesion identified.  Within normal limits in
parenchymal echogenicity.

IVC:  Appears normal.

Pancreas:  No abnormality identified.

Spleen:  Within normal limits in size and echotexture.

Right kidney:  Normal in size and parenchymal echogenicity.  No
evidence of mass or hydronephrosis.

Left kidney:  Normal in size and parenchymal echogenicity.  No
evidence of mass or hydronephrosis.

The lower poles of both kidneys are not well visualized due to body
habitus.

Abdominal Aorta:  No aneurysm identified.
IMPRESSION: Negative abdominal ultrasound.

## 2014-04-24 ENCOUNTER — Ambulatory Visit (INDEPENDENT_AMBULATORY_CARE_PROVIDER_SITE_OTHER): Payer: Managed Care, Other (non HMO) | Admitting: Family Medicine

## 2014-04-24 ENCOUNTER — Encounter: Payer: Self-pay | Admitting: Family Medicine

## 2014-04-24 VITALS — BP 114/70 | HR 79 | Temp 99.1°F | Ht 68.0 in | Wt 274.0 lb

## 2014-04-24 DIAGNOSIS — K219 Gastro-esophageal reflux disease without esophagitis: Secondary | ICD-10-CM

## 2014-04-24 DIAGNOSIS — R300 Dysuria: Secondary | ICD-10-CM

## 2014-04-24 DIAGNOSIS — B001 Herpesviral vesicular dermatitis: Secondary | ICD-10-CM

## 2014-04-24 DIAGNOSIS — N3000 Acute cystitis without hematuria: Secondary | ICD-10-CM

## 2014-04-24 LAB — POCT URINALYSIS DIPSTICK
BILIRUBIN UA: NEGATIVE
Glucose, UA: NEGATIVE
KETONES UA: NEGATIVE
Nitrite, UA: NEGATIVE
Protein, UA: NEGATIVE
Spec Grav, UA: 1.01
Urobilinogen, UA: 0.2
pH, UA: 7

## 2014-04-24 MED ORDER — VALACYCLOVIR HCL 500 MG PO TABS: 500.0000 mg | ORAL_TABLET | Freq: Two times a day (BID) | ORAL | Status: DC

## 2014-04-24 MED ORDER — NITROFURANTOIN MONOHYD MACRO 100 MG PO CAPS: 100.0000 mg | ORAL_CAPSULE | Freq: Two times a day (BID) | ORAL | Status: DC

## 2014-04-24 MED ORDER — OMEPRAZOLE 40 MG PO CPDR: 40.0000 mg | DELAYED_RELEASE_CAPSULE | Freq: Two times a day (BID) | ORAL | Status: DC

## 2014-04-24 NOTE — Progress Notes (Signed)
Pre visit review using our clinic review tool, if applicable. No additional management support is needed unless otherwise documented below in the visit note. 

## 2014-04-24 NOTE — Progress Notes (Signed)
   Subjective:    Patient ID: Lisa Horne, female    DOB: 01-Oct-1975, 39 y.o.   MRN: [id number]  HPI Here for several things. First she is taking OTC Omeprazole 20 mg bid but she still has frequent GERD sx. Also she has had urinary urgency and burning for a week. Lastly she has fever blisters and wants to treat them. She has never asked [place] about this before.    Review of Systems  Constitutional: Negative.   Genitourinary: Positive for dysuria, urgency and frequency. Negative for hematuria and flank pain.       Objective:   Physical Exam  Constitutional: She appears well-developed and well-nourished.  Cardiovascular: Normal rate, regular rhythm, normal heart sounds and intact distal pulses.   Pulmonary/Chest: Effort normal and breath sounds normal.  Abdominal: Soft. Bowel sounds are normal. She exhibits no distension and no mass. There is no rebound and no guarding.  Tender above the pubis           Assessment & Plan:  Treat the UTI with Macrobid and culture the sample. Increase Omeprazole to 40 mg bid. Try Valtrex for the fever blisters.

## 2014-04-26 LAB — URINE CULTURE
COLONY COUNT: NO GROWTH
Organism ID, Bacteria: NO GROWTH

## 2014-10-17 ENCOUNTER — Other Ambulatory Visit: Payer: Self-pay | Admitting: Family Medicine

## 2015-01-08 ENCOUNTER — Ambulatory Visit (INDEPENDENT_AMBULATORY_CARE_PROVIDER_SITE_OTHER): Payer: 59 | Admitting: Family Medicine

## 2015-01-08 DIAGNOSIS — Z23 Encounter for immunization: Secondary | ICD-10-CM | POA: Diagnosis not present

## 2015-01-21 ENCOUNTER — Other Ambulatory Visit: Payer: Self-pay | Admitting: Family Medicine

## 2015-01-29 ENCOUNTER — Other Ambulatory Visit: Payer: Self-pay | Admitting: Family Medicine

## 2015-01-29 LAB — BASIC METABOLIC PANEL
BUN: 15 mg/dL (ref 7–25)
CALCIUM: 9 mg/dL (ref 8.6–10.2)
CO2: 26 mmol/L (ref 20–31)
Chloride: 107 mmol/L (ref 98–110)
Creat: 0.7 mg/dL (ref 0.50–1.10)
GLUCOSE: 109 mg/dL — AB (ref 65–99)
Potassium: 4.4 mmol/L (ref 3.5–5.3)
Sodium: 139 mmol/L (ref 135–146)

## 2015-01-29 LAB — LIPID PANEL
CHOL/HDL RATIO: 4.3 ratio (ref <=5.0)
Cholesterol: 178 mg/dL (ref 125–200)
HDL: 41 mg/dL — ABNORMAL LOW (ref >=46)
LDL Cholesterol: 123 mg/dL (ref <130)
TRIGLYCERIDES: 72 mg/dL (ref <150)
VLDL: 14 mg/dL (ref <30)

## 2015-01-29 LAB — CBC WITH DIFFERENTIAL/PLATELET
Basophils Absolute: 0.1 10*3/uL (ref 0.0–0.1)
Basophils Relative: 1 % (ref 0–1)
EOS ABS: 0.2 10*3/uL (ref 0.0–0.7)
EOS PCT: 3 % (ref 0–5)
HCT: 39.9 % (ref 36.0–46.0)
HEMOGLOBIN: 13.4 g/dL (ref 12.0–15.0)
Lymphocytes Relative: 23 % (ref 12–46)
Lymphs Abs: 1.7 10*3/uL (ref 0.7–4.0)
MCH: 29.6 pg (ref 26.0–34.0)
MCHC: 33.6 g/dL (ref 30.0–36.0)
MCV: 88.3 fL (ref 78.0–100.0)
MONO ABS: 0.7 10*3/uL (ref 0.1–1.0)
MONOS PCT: 10 % (ref 3–12)
MPV: 12.3 fL (ref 8.6–12.4)
NEUTROS ABS: 4.5 10*3/uL (ref 1.7–7.7)
Neutrophils Relative %: 63 % (ref 43–77)
PLATELETS: 242 10*3/uL (ref 150–400)
RBC: 4.52 MIL/uL (ref 3.87–5.11)
RDW: 13.5 % (ref 11.5–15.5)
WBC: 7.2 10*3/uL (ref 4.0–10.5)

## 2015-01-29 LAB — HEMOGLOBIN A1C
HEMOGLOBIN A1C: 5.5 % (ref <5.7)
Mean Plasma Glucose: 111 mg/dL (ref <117)

## 2015-01-29 LAB — HEPATIC FUNCTION PANEL
ALT: 15 U/L (ref 6–29)
AST: 14 U/L (ref 10–30)
Albumin: 3.8 g/dL (ref 3.6–5.1)
Alkaline Phosphatase: 57 U/L (ref 33–115)
BILIRUBIN DIRECT: 0.1 mg/dL (ref <=0.2)
BILIRUBIN INDIRECT: 0.6 mg/dL (ref 0.2–1.2)
TOTAL PROTEIN: 6.3 g/dL (ref 6.1–8.1)
Total Bilirubin: 0.7 mg/dL (ref 0.2–1.2)

## 2015-01-30 ENCOUNTER — Encounter: Payer: Self-pay | Admitting: Family Medicine

## 2015-01-30 ENCOUNTER — Ambulatory Visit (INDEPENDENT_AMBULATORY_CARE_PROVIDER_SITE_OTHER): Payer: 59 | Admitting: Family Medicine

## 2015-01-30 VITALS — BP 133/82 | HR 75 | Temp 99.4°F | Ht 68.0 in | Wt 254.0 lb

## 2015-01-30 DIAGNOSIS — Z23 Encounter for immunization: Secondary | ICD-10-CM

## 2015-01-30 DIAGNOSIS — Z0000 Encounter for general adult medical examination without abnormal findings: Secondary | ICD-10-CM

## 2015-01-30 LAB — URINALYSIS, ROUTINE W REFLEX MICROSCOPIC
Bilirubin Urine: NEGATIVE
GLUCOSE, UA: NEGATIVE
HGB URINE DIPSTICK: NEGATIVE
Ketones, ur: NEGATIVE
LEUKOCYTES UA: NEGATIVE
NITRITE: NEGATIVE
PH: 7 (ref 5.0–8.0)
PROTEIN: NEGATIVE
Specific Gravity, Urine: 1.022 (ref 1.001–1.035)

## 2015-01-30 LAB — TSH: TSH: 0.85 u[IU]/mL (ref 0.350–4.500)

## 2015-01-30 MED ORDER — LEVOTHYROXINE SODIUM 200 MCG PO TABS: ORAL_TABLET | ORAL | Status: DC

## 2015-01-30 MED ORDER — LEVOTHYROXINE SODIUM 50 MCG PO TABS: 50.0000 ug | ORAL_TABLET | Freq: Every day | ORAL | Status: DC

## 2015-01-30 MED ORDER — VALACYCLOVIR HCL 500 MG PO TABS: 500.0000 mg | ORAL_TABLET | Freq: Two times a day (BID) | ORAL | Status: DC

## 2015-01-30 MED ORDER — OMEPRAZOLE 40 MG PO CPDR: 40.0000 mg | DELAYED_RELEASE_CAPSULE | Freq: Two times a day (BID) | ORAL | Status: DC

## 2015-01-30 NOTE — Progress Notes (Signed)
   Subjective:    Patient ID: Lisa Horne, female    DOB: Apr 10, 1976, 39 y.o.   MRN: [id number]  HPI 39 yr old female for a cpx. She feels well and has no concerns. She exercises and in fact plans to run a half marathon this weekend at [facility]. She brings lab results with her today which were obtained as part of a wellness program at her work. Her A1c was excellent at 5.4, LDL was high at 152, and TSH was high at 5.82.    Review of Systems  Constitutional: Negative.   HENT: Negative.   Eyes: Negative.   Respiratory: Negative.   Cardiovascular: Negative.   Gastrointestinal: Negative.   Genitourinary: Negative for dysuria, urgency, frequency, hematuria, flank pain, decreased urine volume, enuresis, difficulty urinating, pelvic pain and dyspareunia.  Musculoskeletal: Negative.   Skin: Negative.   Neurological: Negative.   Psychiatric/Behavioral: Negative.        Objective:   Physical Exam  Constitutional: She is oriented to person, place, and time. She appears well-developed and well-nourished. No distress.  HENT:  Head: Normocephalic and atraumatic.  Right Ear: External ear normal.  Left Ear: External ear normal.  Nose: Nose normal.  Mouth/Throat: Oropharynx is clear and moist. No oropharyngeal exudate.  Eyes: Conjunctivae and EOM are normal. Pupils are equal, round, and reactive to light. No scleral icterus.  Neck: Normal range of motion. Neck supple. No JVD present. No thyromegaly present.  Cardiovascular: Normal rate, regular rhythm, normal heart sounds and intact distal pulses.  Exam reveals no gallop and no friction rub.   No murmur heard. Pulmonary/Chest: Effort normal and breath sounds normal. No respiratory distress. She has no wheezes. She has no rales. She exhibits no tenderness.  Abdominal: Soft. Bowel sounds are normal. She exhibits no distension and no mass. There is no tenderness. There is no rebound and no guarding.  Musculoskeletal: Normal range of  motion. She exhibits no edema or tenderness.  Lymphadenopathy:    She has no cervical adenopathy.  Neurological: She is alert and oriented to person, place, and time. She has normal reflexes. No cranial nerve deficit. She exhibits normal muscle tone. Coordination normal.  Skin: Skin is warm and dry. No rash noted. No erythema.  Psychiatric: She has a normal mood and affect. Her behavior is normal. Judgment and thought content normal.          Assessment & Plan:  Well exam. We discussed diet and exercise advice. She as stopped taking Metformin and I agreed with this. We will increase her Synthroid dose to 250 mcg daily and will recheck a TSH in 90 days.

## 2015-01-30 NOTE — Progress Notes (Signed)
Pre visit review using our clinic review tool, if applicable. No additional management support is needed unless otherwise documented below in the visit note. 

## 2015-03-02 ENCOUNTER — Ambulatory Visit: Payer: 59 | Admitting: Family Medicine

## 2015-03-02 ENCOUNTER — Ambulatory Visit (INDEPENDENT_AMBULATORY_CARE_PROVIDER_SITE_OTHER): Payer: 59 | Admitting: Family Medicine

## 2015-03-02 DIAGNOSIS — Z23 Encounter for immunization: Secondary | ICD-10-CM | POA: Diagnosis not present

## 2015-03-19 ENCOUNTER — Encounter: Payer: Self-pay | Admitting: Internal Medicine

## 2015-05-31 ENCOUNTER — Other Ambulatory Visit: Payer: Self-pay | Admitting: Family Medicine

## 2015-08-14 ENCOUNTER — Encounter: Payer: Self-pay | Admitting: Internal Medicine

## 2015-09-14 ENCOUNTER — Ambulatory Visit (INDEPENDENT_AMBULATORY_CARE_PROVIDER_SITE_OTHER): Payer: BLUE CROSS/BLUE SHIELD | Admitting: *Deleted

## 2015-09-14 DIAGNOSIS — Z23 Encounter for immunization: Secondary | ICD-10-CM | POA: Diagnosis not present

## 2015-11-07 ENCOUNTER — Ambulatory Visit (INDEPENDENT_AMBULATORY_CARE_PROVIDER_SITE_OTHER): Payer: BLUE CROSS/BLUE SHIELD | Admitting: Family Medicine

## 2015-11-07 DIAGNOSIS — Z23 Encounter for immunization: Secondary | ICD-10-CM

## 2015-11-09 ENCOUNTER — Other Ambulatory Visit: Payer: Self-pay | Admitting: Family Medicine

## 2015-11-09 ENCOUNTER — Encounter: Payer: Self-pay | Admitting: Family Medicine

## 2015-11-09 ENCOUNTER — Ambulatory Visit: Payer: BLUE CROSS/BLUE SHIELD | Admitting: Family Medicine

## 2015-11-09 ENCOUNTER — Ambulatory Visit (INDEPENDENT_AMBULATORY_CARE_PROVIDER_SITE_OTHER): Payer: BLUE CROSS/BLUE SHIELD | Admitting: Family Medicine

## 2015-11-09 VITALS — BP 117/89 | HR 79 | Temp 98.6°F | Ht 68.0 in | Wt 285.0 lb

## 2015-11-09 DIAGNOSIS — Z209 Contact with and (suspected) exposure to unspecified communicable disease: Secondary | ICD-10-CM | POA: Diagnosis not present

## 2015-11-09 LAB — TB SKIN TEST
INDURATION: 0 mm
TB SKIN TEST: NEGATIVE

## 2015-11-09 NOTE — Progress Notes (Signed)
   Subjective:    Patient ID: Lisa HatchetBobbie G Kreisler, female    DOB: [birth year], 40 y.o.   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 Part of this process is documenting immunity to diseases. She had all her childhood shots but we do not have documentation of this, so we will order antibody titers for proof. She feels fine.    Review of Systems  Constitutional: Negative.   Respiratory: Negative.   Cardiovascular: Negative.   Neurological: Negative.        Objective:   Physical Exam  Constitutional: She is oriented to person, place, and time. She appears well-developed and well-nourished.  Neck: No thyromegaly present.  Cardiovascular: Normal rate, regular rhythm, normal heart sounds and intact distal pulses.   Pulmonary/Chest: Effort normal and breath sounds normal.  Lymphadenopathy:    She has no cervical adenopathy.  Neurological: She is alert and oriented to person, place, and time.          Assessment & Plan:  She is doing well. The physical exam portion of her forms were filled out and we will order immune titers for rubella, measles, mumps, diphtheria, tetanus, pertussis, and varicella/zoster.  Nelwyn SalisburyFRY,Valen Gillison A, MD

## 2015-11-09 NOTE — Progress Notes (Signed)
Pre visit review using our clinic review tool, if applicable. No additional management support is needed unless otherwise documented below in the visit note. 

## 2015-11-12 LAB — MEASLES/MUMPS/RUBELLA IMMUNITY
MUMPS IGG: 155 [AU]/ml — AB (ref <9.00)
RUBELLA: 3.02 {index} — AB (ref <0.90)
Rubeola IgG: >300 AU/mL — ABNORMAL HIGH (ref <25.00)

## 2015-11-12 LAB — VARICELLA ZOSTER ANTIBODY, IGG: Varicella IgG: 352.9 Index — ABNORMAL HIGH (ref <135.00)

## 2015-11-12 LAB — IGG: IgG (Immunoglobin G), Serum: 1391 mg/dL (ref 694–1618)

## 2015-11-13 LAB — DIPHTHERIA / TETANUS ANTIBODY PANEL
Diphtheria Ab: 1.16 IU/mL
Tetanus Toxin Antibody, Total: 4.77 IU/mL (ref >0.15)

## 2015-11-13 LAB — BORDETELLA PERTUSSIS AB IGG,IGA
FHA IgA: 2 IU/mL
FHA IgG: 106 IU/mL — ABNORMAL HIGH
PT IGG: 5 [IU]/mL

## 2016-01-14 ENCOUNTER — Ambulatory Visit (INDEPENDENT_AMBULATORY_CARE_PROVIDER_SITE_OTHER): Payer: BLUE CROSS/BLUE SHIELD | Admitting: *Deleted

## 2016-01-14 DIAGNOSIS — Z23 Encounter for immunization: Secondary | ICD-10-CM

## 2016-01-23 ENCOUNTER — Encounter (HOSPITAL_COMMUNITY): Payer: Self-pay

## 2016-04-22 ENCOUNTER — Encounter: Payer: Self-pay | Admitting: Internal Medicine

## 2016-04-22 ENCOUNTER — Ambulatory Visit (INDEPENDENT_AMBULATORY_CARE_PROVIDER_SITE_OTHER): Payer: Managed Care, Other (non HMO) | Admitting: Internal Medicine

## 2016-04-22 VITALS — BP 118/74 | HR 71 | Temp 99.2°F | Wt 294.0 lb

## 2016-04-22 DIAGNOSIS — J020 Streptococcal pharyngitis: Secondary | ICD-10-CM | POA: Diagnosis not present

## 2016-04-22 MED ORDER — AMOXICILLIN 875 MG PO TABS: 875.0000 mg | ORAL_TABLET | Freq: Two times a day (BID) | ORAL | 0 refills | Status: DC

## 2016-04-22 NOTE — Patient Instructions (Signed)

## 2016-04-22 NOTE — Progress Notes (Signed)
Subjective:    Patient ID: Lisa Horne, female    DOB: [birth year], 41 y.o.   MRN: [id number]  HPI  Pt presents to the clinic today with c/o a sore throat. This started 2-3 days ago. She reports difficulty swallowing fluids and solids. She denies runny nose, nasal congestion, ear pain or cough. She has run fevers up to 101, had chills and body aches. She has tried Ibuprofen and salt water gargles with minimal relief. She has not had sick contacts that she is aware of.  Review of Systems      Past Medical History:  Diagnosis Date  . GERD (gastroesophageal reflux disease)   . Morbid obesity (HCC)   . PCOS (polycystic ovarian syndrome)   . PONV (postoperative nausea and vomiting)   . Thyroid disease    hypothyroidism    Current Outpatient Prescriptions  Medication Sig Dispense Refill  . CALCIUM-VITAMIN D PO Take 1 tablet by mouth daily.    Marland Kitchen IRON-VITAMIN C PO Take 1 tablet by mouth daily.    Marland Kitchen levonorgestrel (MIRENA) 20 MCG/24HR IUD 1 each by Intrauterine route once.    Marland Kitchen levothyroxine (SYNTHROID, LEVOTHROID) 200 MCG tablet TAKE 1 TABLET BY MOUTH EVERY DAY BEFORE BREAKFAST 90 tablet 3  . levothyroxine (SYNTHROID, LEVOTHROID) 50 MCG tablet Take 1 tablet (50 mcg total) by mouth daily. 90 tablet 3  . Multiple Vitamin (MULTIVITAMIN) capsule Take 1 capsule by mouth daily.    Marland Kitchen omeprazole (PRILOSEC) 40 MG capsule Take 1 capsule (40 mg total) by mouth 2 (two) times daily. 180 capsule 3  . Specialty Vitamins Products (MAGNESIUM, AMINO ACID CHELATE,) 133 MG tablet Take 1 tablet by mouth daily.    . valACYclovir (VALTREX) 500 MG tablet Take 1 tablet (500 mg total) by mouth 2 (two) times daily. 30 tablet 5   No current facility-administered medications for this visit.     Allergies  Allergen Reactions  . Ciprofloxacin     REACTION: severe headache/vomiting  . Hydrochlorothiazide W-Triamterene     REACTION: malaise    Family History  Problem Relation Age of Onset  .  Hypertension Mother   . Cancer Mother     breast  . Depression Mother   . Diabetes Father   . Cancer Father     skin  . Depression Sister     Social History   Social History  . Marital status: Married    Spouse name: N/A  . Number of children: N/A  . Years of education: N/A   Occupational History  . Not on file.   Social History Main Topics  . Smoking status: Never Smoker  . Smokeless tobacco: Never Used  . Alcohol use No  . Drug use: No  . Sexual activity: Yes    Birth control/ protection: IUD   Other Topics Concern  . Not on file   Social History Narrative  . No narrative on file     Constitutional: Denies fever, malaise, fatigue, headache or abrupt weight changes.  HEENT: Pt reports sore throat. Denies eye pain, eye redness, ear pain, ringing in the ears, wax buildup, runny nose, nasal congestion, bloody nose. Respiratory: Denies difficulty breathing, shortness of breath, cough or sputum production.    No other specific complaints in a complete review of systems (except as listed in HPI above).  Objective:   Physical Exam  BP 118/74   Pulse 71   Temp 99.2 F (37.3 C) (Oral)   Wt 294 lb (133.4 kg)  SpO2 97%   BMI 44.70 kg/m  Wt Readings from Last 3 Encounters:  04/22/16 294 lb (133.4 kg)  11/09/15 285 lb (129.3 kg)  01/30/15 254 lb (115.2 kg)    General: Appears her stated age, obese in NAD. HEENT: Throat/Mouth: Teeth present, mucosa erythematous and moist, no exudate, lesions or ulcerations noted.  Neck:  Bilateral anterior cervical adenopathy noted. Cardiovascular: Normal rate and rhythm.  Pulmonary/Chest: Normal effort and positive vesicular breath sounds. No respiratory distress. No wheezes, rales or ronchi noted.   BMET    Component Value Date/Time   NA 139 01/29/2015 0846   K 4.4 01/29/2015 0846   CL 107 01/29/2015 0846   CO2 26 01/29/2015 0846   GLUCOSE 109 (H) 01/29/2015 0846   BUN 15 01/29/2015 0846   CREATININE 0.70 01/29/2015  0846   CALCIUM 9.0 01/29/2015 0846   GFRNONAA 86 (L) 11/08/2013 1719   GFRAA >90 11/08/2013 1719    Lipid Panel     Component Value Date/Time   CHOL 178 01/29/2015 0846   TRIG 72 01/29/2015 0846   HDL 41 (L) 01/29/2015 0846   CHOLHDL 4.3 01/29/2015 0846   VLDL 14 01/29/2015 0846   LDLCALC 123 01/29/2015 0846    CBC    Component Value Date/Time   WBC 7.2 01/29/2015 0846   RBC 4.52 01/29/2015 0846   HGB 13.4 01/29/2015 0846   HCT 39.9 01/29/2015 0846   PLT 242 01/29/2015 0846   MCV 88.3 01/29/2015 0846   MCH 29.6 01/29/2015 0846   MCHC 33.6 01/29/2015 0846   RDW 13.5 01/29/2015 0846   LYMPHSABS 1.7 01/29/2015 0846   MONOABS 0.7 01/29/2015 0846   EOSABS 0.2 01/29/2015 0846   BASOSABS 0.1 01/29/2015 0846    Hgb A1C Lab Results  Component Value Date   HGBA1C 5.5 01/29/2015            Assessment & Plan:   Step throat:  RST: positive eRx for Amoxil BID x 10 days 80 mg Depo IM today Work note provided Return precautions discussed  RTC as needed or if symptoms persist or worsen BAITY, REGINA, NP

## 2016-06-03 ENCOUNTER — Other Ambulatory Visit: Payer: Self-pay | Admitting: Family Medicine

## 2016-06-04 ENCOUNTER — Telehealth: Payer: Self-pay

## 2016-06-04 NOTE — Telephone Encounter (Signed)
Received PA request for Omeprazole 40 mg capsules from [facility]. PA approved & form faxed back to pharmacy.

## 2016-07-17 ENCOUNTER — Other Ambulatory Visit: Payer: Self-pay | Admitting: Family Medicine

## 2016-07-17 DIAGNOSIS — Z1231 Encounter for screening mammogram for malignant neoplasm of breast: Secondary | ICD-10-CM

## 2016-07-21 ENCOUNTER — Ambulatory Visit
Admission: RE | Admit: 2016-07-21 | Discharge: 2016-07-21 | Disposition: A | Discharge status: Home / Self Care | Payer: Managed Care, Other (non HMO) | Source: Ambulatory Visit | Attending: Family Medicine | Admitting: Family Medicine

## 2016-07-21 DIAGNOSIS — Z1231 Encounter for screening mammogram for malignant neoplasm of breast: Secondary | ICD-10-CM

## 2016-07-28 ENCOUNTER — Encounter: Payer: Self-pay | Admitting: Family Medicine

## 2016-07-28 ENCOUNTER — Other Ambulatory Visit (INDEPENDENT_AMBULATORY_CARE_PROVIDER_SITE_OTHER): Payer: 59

## 2016-07-28 ENCOUNTER — Ambulatory Visit (INDEPENDENT_AMBULATORY_CARE_PROVIDER_SITE_OTHER): Payer: 59 | Admitting: Family Medicine

## 2016-07-28 VITALS — BP 130/86 | HR 74 | Temp 99.0°F | Ht 68.0 in | Wt 287.0 lb

## 2016-07-28 DIAGNOSIS — E039 Hypothyroidism, unspecified: Secondary | ICD-10-CM

## 2016-07-28 DIAGNOSIS — I43 Cardiomyopathy in diseases classified elsewhere: Secondary | ICD-10-CM

## 2016-07-28 DIAGNOSIS — Z0000 Encounter for general adult medical examination without abnormal findings: Secondary | ICD-10-CM

## 2016-07-28 DIAGNOSIS — I519 Heart disease, unspecified: Principal | ICD-10-CM

## 2016-07-28 LAB — HEPATIC FUNCTION PANEL
ALBUMIN: 3.8 g/dL (ref 3.5–5.2)
ALK PHOS: 57 U/L (ref 39–117)
ALT: 20 U/L (ref 0–35)
AST: 18 U/L (ref 0–37)
Bilirubin, Direct: 0.1 mg/dL (ref 0.0–0.3)
TOTAL PROTEIN: 6.8 g/dL (ref 6.0–8.3)
Total Bilirubin: 0.6 mg/dL (ref 0.2–1.2)

## 2016-07-28 LAB — BASIC METABOLIC PANEL
BUN: 12 mg/dL (ref 6–23)
CALCIUM: 8.9 mg/dL (ref 8.4–10.5)
CHLORIDE: 105 meq/L (ref 96–112)
CO2: 28 meq/L (ref 19–32)
Creatinine, Ser: 0.87 mg/dL (ref 0.40–1.20)
GFR: 75.45 mL/min (ref >60.00)
GLUCOSE: 105 mg/dL — AB (ref 70–99)
POTASSIUM: 4.2 meq/L (ref 3.5–5.1)
SODIUM: 140 meq/L (ref 135–145)

## 2016-07-28 LAB — POC URINALSYSI DIPSTICK (AUTOMATED)
Bilirubin, UA: NEGATIVE
Glucose, UA: NEGATIVE
Ketones, UA: NEGATIVE
Leukocytes, UA: NEGATIVE
Nitrite, UA: NEGATIVE
PH UA: 6 (ref 5.0–8.0)
PROTEIN UA: NEGATIVE
Spec Grav, UA: 1.03 (ref 1.030–1.035)
UROBILINOGEN UA: 0.2 (ref ?–2.0)

## 2016-07-28 LAB — LIPID PANEL
CHOLESTEROL: 210 mg/dL — AB (ref 0–200)
HDL: 41 mg/dL (ref >39.00)
LDL Cholesterol: 150 mg/dL — ABNORMAL HIGH (ref 0–99)
NONHDL: 168.99
Total CHOL/HDL Ratio: 5
Triglycerides: 96 mg/dL (ref 0.0–149.0)
VLDL: 19.2 mg/dL (ref 0.0–40.0)

## 2016-07-28 LAB — T3, FREE: T3, Free: 3.6 pg/mL (ref 2.3–4.2)

## 2016-07-28 LAB — CBC WITH DIFFERENTIAL/PLATELET
BASOS ABS: 0.1 10*3/uL (ref 0.0–0.1)
BASOS PCT: 0.8 % (ref 0.0–3.0)
EOS PCT: 2 % (ref 0.0–5.0)
Eosinophils Absolute: 0.2 10*3/uL (ref 0.0–0.7)
HCT: 41.9 % (ref 36.0–46.0)
Hemoglobin: 14.2 g/dL (ref 12.0–15.0)
LYMPHS ABS: 1.8 10*3/uL (ref 0.7–4.0)
LYMPHS PCT: 22.1 % (ref 12.0–46.0)
MCHC: 33.9 g/dL (ref 30.0–36.0)
MCV: 90.3 fl (ref 78.0–100.0)
Monocytes Absolute: 0.9 10*3/uL (ref 0.1–1.0)
Monocytes Relative: 11.4 % (ref 3.0–12.0)
NEUTROS ABS: 5.1 10*3/uL (ref 1.4–7.7)
NEUTROS PCT: 63.7 % (ref 43.0–77.0)
PLATELETS: 220 10*3/uL (ref 150.0–400.0)
RBC: 4.64 Mil/uL (ref 3.87–5.11)
RDW: 13.2 % (ref 11.5–15.5)
WBC: 7.9 10*3/uL (ref 4.0–10.5)

## 2016-07-28 LAB — T4, FREE: Free T4: 0.82 ng/dL (ref 0.60–1.60)

## 2016-07-28 LAB — HEMOGLOBIN A1C: HEMOGLOBIN A1C: 5.7 % (ref 4.6–6.5)

## 2016-07-28 LAB — TSH: TSH: 6.59 u[IU]/mL — AB (ref 0.35–4.50)

## 2016-07-28 MED ORDER — OMEPRAZOLE 40 MG PO CPDR: 40.0000 mg | DELAYED_RELEASE_CAPSULE | Freq: Two times a day (BID) | ORAL | 3 refills | Status: DC

## 2016-07-28 MED ORDER — LEVOTHYROXINE SODIUM 200 MCG PO TABS: ORAL_TABLET | ORAL | 3 refills | Status: DC

## 2016-07-28 MED ORDER — PHENTERMINE HCL 15 MG PO CAPS: 15.0000 mg | ORAL_CAPSULE | ORAL | 1 refills | Status: DC

## 2016-07-28 MED ORDER — LEVOTHYROXINE SODIUM 50 MCG PO TABS: 50.0000 ug | ORAL_TABLET | Freq: Every day | ORAL | 3 refills | Status: DC

## 2016-07-28 MED ORDER — VALACYCLOVIR HCL 500 MG PO TABS: 500.0000 mg | ORAL_TABLET | Freq: Two times a day (BID) | ORAL | 5 refills | Status: DC

## 2016-07-28 NOTE — Progress Notes (Signed)
   Subjective:    Patient ID: Lisa Horne, female    DOB: 1976-04-16, 41 y.o.   MRN: [id number]  HPI 41 yr old female for a wellness exam. She feels fine but is frustrated at her difficulty with losing weight. She admits to getting no exercise. Her labs were remarkable for an LDL up to 151 and a TSH up to 6.59.    Review of Systems  Constitutional: Negative.   HENT: Negative.   Eyes: Negative.   Respiratory: Negative.   Cardiovascular: Negative.   Gastrointestinal: Negative.   Genitourinary: Negative for decreased urine volume, difficulty urinating, dyspareunia, dysuria, enuresis, flank pain, frequency, hematuria, pelvic pain and urgency.  Musculoskeletal: Negative.   Skin: Negative.   Neurological: Negative.   Psychiatric/Behavioral: Negative.        Objective:   Physical Exam  Constitutional: She is oriented to person, place, and time. She appears well-developed and well-nourished. No distress.  HENT:  Head: Normocephalic and atraumatic.  Right Ear: External ear normal.  Left Ear: External ear normal.  Nose: Nose normal.  Mouth/Throat: Oropharynx is clear and moist. No oropharyngeal exudate.  Eyes: Conjunctivae and EOM are normal. Pupils are equal, round, and reactive to light. No scleral icterus.  Neck: Normal range of motion. Neck supple. No JVD present. No thyromegaly present.  Cardiovascular: Normal rate, regular rhythm, normal heart sounds and intact distal pulses.  Exam reveals no gallop and no friction rub.   No murmur heard. Pulmonary/Chest: Effort normal and breath sounds normal. No respiratory distress. She has no wheezes. She has no rales. She exhibits no tenderness.  Abdominal: Soft. Bowel sounds are normal. She exhibits no distension and no mass. There is no tenderness. There is no rebound and no guarding.  Musculoskeletal: Normal range of motion. She exhibits no edema or tenderness.  Lymphadenopathy:    She has no cervical adenopathy.  Neurological: She  is alert and oriented to person, place, and time. She has normal reflexes. No cranial nerve deficit. She exhibits normal muscle tone. Coordination normal.  Skin: Skin is warm and dry. No rash noted. No erythema.  Psychiatric: She has a normal mood and affect. Her behavior is normal. Judgment and thought content normal.          Assessment & Plan:  Well exam. We discussed diet and exercise. Try Phentermine again at a low dose, so it does not interfere with her sleep. Recheck as TSH today as well as a free T3 and free T4.  Gershon Crane, MD

## 2016-07-28 NOTE — Patient Instructions (Signed)
WE NOW OFFER   [facility] FAST TRACK!!!  SAME DAY Appointments for ACUTE CARE  Such as: Sprains, Injuries, cuts, abrasions, rashes, muscle pain, joint pain, back pain Colds, flu, sore throats, headache, allergies, cough, fever  Ear pain, sinus and eye infections Abdominal pain, nausea, vomiting, diarrhea, upset stomach Animal/insect bites  3 Easy Ways to Schedule: Walk-In Scheduling Call in scheduling Mychart Sign-up: [email or web address]         

## 2016-07-28 NOTE — Progress Notes (Signed)
Pre visit review using our clinic review tool, if applicable. No additional management support is needed unless otherwise documented below in the visit note. 

## 2016-09-28 ENCOUNTER — Ambulatory Visit (HOSPITAL_COMMUNITY)
Admission: EM | Admit: 2016-09-28 | Discharge: 2016-09-28 | Disposition: A | Discharge status: Home / Self Care | Payer: 59 | Attending: Internal Medicine | Admitting: Internal Medicine

## 2016-09-28 ENCOUNTER — Encounter (HOSPITAL_COMMUNITY): Payer: Self-pay | Admitting: Emergency Medicine

## 2016-09-28 ENCOUNTER — Ambulatory Visit (INDEPENDENT_AMBULATORY_CARE_PROVIDER_SITE_OTHER): Payer: 59

## 2016-09-28 DIAGNOSIS — M5416 Radiculopathy, lumbar region: Secondary | ICD-10-CM | POA: Diagnosis not present

## 2016-09-28 DIAGNOSIS — M549 Dorsalgia, unspecified: Secondary | ICD-10-CM | POA: Diagnosis not present

## 2016-09-28 DIAGNOSIS — M545 Low back pain: Secondary | ICD-10-CM | POA: Diagnosis not present

## 2016-09-28 MED ORDER — PREDNISONE 10 MG (21) PO TBPK: ORAL_TABLET | Freq: Every day | ORAL | 0 refills | Status: DC

## 2016-09-28 MED ORDER — KETOROLAC TROMETHAMINE 30 MG/ML IJ SOLN
30.0000 mg | Freq: Once | INTRAMUSCULAR | Status: AC
  Administered 2016-09-28: 30 mg via INTRAMUSCULAR

## 2016-09-28 MED ORDER — METHOCARBAMOL 500 MG PO TABS: 500.0000 mg | ORAL_TABLET | Freq: Two times a day (BID) | ORAL | 0 refills | Status: DC

## 2016-09-28 MED ORDER — DEXAMETHASONE SODIUM PHOSPHATE 10 MG/ML IJ SOLN
INTRAMUSCULAR | Status: AC
  Filled 2016-09-28: qty 1

## 2016-09-28 MED ORDER — KETOROLAC TROMETHAMINE 30 MG/ML IJ SOLN
INTRAMUSCULAR | Status: AC
  Filled 2016-09-28: qty 1

## 2016-09-28 MED ORDER — DEXAMETHASONE SODIUM PHOSPHATE 10 MG/ML IJ SOLN
10.0000 mg | Freq: Once | INTRAMUSCULAR | Status: AC
  Administered 2016-09-28: 10 mg via INTRAMUSCULAR

## 2016-09-28 NOTE — ED Triage Notes (Signed)
[facility] a [facility] pain that radiates into her right leg x 2 days.

## 2016-09-28 NOTE — Discharge Instructions (Signed)
For your lower lumbar radiculopathy, starting on prednisone, take this as directed, no also started you on a muscle relaxer called Robaxin. Take one tablet twice a day as needed. Follow up with your primary care provider if her pain persists.

## 2016-09-28 NOTE — ED Provider Notes (Signed)
CSN: [id number]     Arrival date & time 09/28/16  1212 History   First MD Initiated Contact with Patient 09/28/16 1305     Chief Complaint  Patient presents with  . Back Pain   (Consider location/radiation/quality/duration/timing/severity/associated sxs/prior Treatment) The history is provided by the patient.  Back Pain  Location:  Lumbar spine Quality:  Aching, burning and shooting Radiates to:  R thigh Pain severity:  Severe Pain is:  Same all the time Onset quality:  Gradual Duration:  2 days Timing:  Constant Progression:  Worsening Chronicity:  New Context: not falling, not jumping from heights, not lifting heavy objects, not MCA, not occupational injury, not recent illness and not twisting   Relieved by:  Being still Worsened by:  Ambulation, movement, twisting and standing Ineffective treatments:  NSAIDs and OTC medications Associated symptoms: tingling   Associated symptoms: no abdominal pain, no bladder incontinence, no bowel incontinence, no fever, no headaches, no numbness and no paresthesias     Past Medical History:  Diagnosis Date  . GERD (gastroesophageal reflux disease)   . Morbid obesity (HCC)   . PCOS (polycystic ovarian syndrome)   . PONV (postoperative nausea and vomiting)   . Thyroid disease    hypothyroidism   Past Surgical History:  Procedure Laterality Date  . GASTRIC BANDING PORT REVISION    . GASTRIC RESTRICTION SURGERY     lap band 2005   Family History  Problem Relation Age of Onset  . Hypertension Mother   . Cancer Mother        breast  . Depression Mother   . Diabetes Father   . Cancer Father        skin  . Depression Sister    Social History  Substance Use Topics  . Smoking status: Never Smoker  . Smokeless tobacco: Never Used  . Alcohol use No   OB History    No data available     Review of Systems  Constitutional: Negative for appetite change, chills and fever.  HENT: Negative.   Respiratory: Negative.    Cardiovascular: Negative.   Gastrointestinal: Negative for abdominal pain, bowel incontinence, constipation, nausea and vomiting.  Genitourinary: Negative for bladder incontinence.  Musculoskeletal: Positive for back pain.  Skin: Negative.   Neurological: Positive for tingling. Negative for numbness, headaches and paresthesias.    Allergies  Ciprofloxacin and Hydrochlorothiazide w-triamterene  Home Medications   Prior to Admission medications   Medication Sig Start Date End Date Taking? Authorizing Provider  cyclobenzaprine (FLEXERIL) 10 MG tablet Take 10 mg by mouth 3 (three) times daily as needed for muscle spasms.   Yes [provider]  ibuprofen (ADVIL,MOTRIN) 800 MG tablet Take 800 mg by mouth every 8 (eight) hours as needed.   Yes [provider]  levonorgestrel (MIRENA) 20 MCG/24HR IUD 1 each by Intrauterine route once.   Yes [provider]  levothyroxine (SYNTHROID, LEVOTHROID) 200 MCG tablet TAKE 1 TABLET BY MOUTH EVERY DAY BEFORE BREAKFAST 07/28/16  Yes Nelwyn Salisbury, MD  levothyroxine (SYNTHROID, LEVOTHROID) 50 MCG tablet Take 1 tablet (50 mcg total) by mouth daily. 07/28/16  Yes Nelwyn Salisbury, MD  omeprazole (PRILOSEC) 40 MG capsule Take 1 capsule (40 mg total) by mouth 2 (two) times daily. 07/28/16  Yes Nelwyn Salisbury, MD  methocarbamol (ROBAXIN) 500 MG tablet Take 1 tablet (500 mg total) by mouth 2 (two) times daily. 09/28/16   Dorena Bodo, NP  predniSONE (STERAPRED UNI-PAK 21 TAB) 10 MG (21) TBPK  tablet Take by mouth daily. Take 6 tabs by mouth daily  for 2 days, then 5 tabs for 2 days, then 4 tabs for 2 days, then 3 tabs for 2 days, 2 tabs for 2 days, then 1 tab by mouth daily for 2 days 09/28/16   Dorena BodoKennard, Hisao Doo, NP   Meds Ordered and Administered this Visit   Medications  ketorolac (TORADOL) 30 MG/ML injection 30 mg (30 mg Intramuscular Given 09/28/16 1338)  dexamethasone (DECADRON) injection 10 mg (10 mg Intramuscular Given 09/28/16 1337)     BP 96/61 (BP Location: Right Arm) Comment: Reported BP to CMA [facility] Wooters  Pulse 82   Temp 98.7 F (37.1 C) (Oral)   Resp 18   SpO2 97%  No data found.   Physical Exam  Constitutional: She is oriented to person, place, and time. She appears well-developed and well-nourished.  HENT:  Head: Normocephalic.  Right Ear: External ear normal.  Left Ear: External ear normal.  Eyes: Conjunctivae are normal.  Neck: Normal range of motion. Neck supple.  Cardiovascular: Normal rate and regular rhythm.   Pulmonary/Chest: Effort normal and breath sounds normal.  Musculoskeletal:       Lumbar back: She exhibits decreased range of motion, tenderness and pain. She exhibits no deformity.  Lymphadenopathy:    She has no cervical adenopathy.  Neurological: She is alert and oriented to person, place, and time.  Skin: Skin is warm and dry. Capillary refill takes less than 2 seconds.  Nursing note and vitals reviewed.   Urgent Care Course     Procedures (including critical care time)  Labs Review Labs Reviewed - No data to display  Imaging Review Dg Lumbar Spine Complete  Result Date: 09/28/2016 CLINICAL DATA:  Lumbago with right lower extremity radicular symptoms EXAM: LUMBAR SPINE - COMPLETE 4+ VIEW COMPARISON:  None. FINDINGS: Standing frontal, standing lateral, standing lumbosacral lateral, and standing bilateral oblique views were obtained. There are 5 non-rib-bearing lumbar type vertebral bodies. There is no fracture or spondylolisthesis. There is moderate disc space narrowing at L5-S1. Other disc spaces appear unremarkable. There is facet osteoarthritic change at L5-S1 bilaterally. There is an intrauterine device in the mid-pelvis. IMPRESSION: Osteoarthritic change at L5-S1. Other disc spaces appear unremarkable. No fracture or spondylolisthesis. There is an intrauterine device in the mid-pelvis. Electronically Signed   By: Bretta BangWilliam  Woodruff III M.D.   On: 09/28/2016 14:06         MDM   1. Lumbar radiculopathy    Lumbar radiculopathy, given injection of Toradol and dexamethasone in clinic. Started on long course of prednisone given prescription of Robaxin. Follow-up with primary care provider in one to 2 weeks as needed.    Dorena BodoKennard, Kenika Sahm, NP 09/28/16 1426

## 2016-10-09 MED FILL — VALACYCLOVIR HCL 500 MG TAB: 500 | 15 days supply | Qty: 30 | Fill #0

## 2016-10-09 MED FILL — LEVOTHYROXINE 50 MCG TABLET: 50 | 90 days supply | Qty: 90 | Fill #0

## 2016-10-09 MED FILL — LEVOTHYROXINE 200 MCG TAB: 200 | 90 days supply | Qty: 90 | Fill #0

## 2016-10-09 MED FILL — OMEPRAZOLE DR 40 MG CAPSULE: 40 | 30 days supply | Qty: 60 | Fill #0

## 2017-01-03 ENCOUNTER — Encounter (HOSPITAL_COMMUNITY): Payer: Self-pay | Admitting: Family Medicine

## 2017-01-03 ENCOUNTER — Ambulatory Visit (HOSPITAL_COMMUNITY)
Admission: EM | Admit: 2017-01-03 | Discharge: 2017-01-03 | Disposition: A | Discharge status: Home / Self Care | Payer: 59 | Attending: Family Medicine | Admitting: Family Medicine

## 2017-01-03 DIAGNOSIS — R110 Nausea: Secondary | ICD-10-CM | POA: Diagnosis not present

## 2017-01-03 DIAGNOSIS — R350 Frequency of micturition: Secondary | ICD-10-CM | POA: Diagnosis not present

## 2017-01-03 DIAGNOSIS — R300 Dysuria: Secondary | ICD-10-CM | POA: Insufficient documentation

## 2017-01-03 LAB — POCT URINALYSIS DIP (DEVICE)
BILIRUBIN URINE: NEGATIVE
GLUCOSE, UA: NEGATIVE mg/dL
Ketones, ur: NEGATIVE mg/dL
Leukocytes, UA: NEGATIVE
Nitrite: NEGATIVE
Protein, ur: NEGATIVE mg/dL
Specific Gravity, Urine: 1.015 (ref 1.005–1.030)
Urobilinogen, UA: 1 mg/dL (ref 0.0–1.0)
pH: 7 (ref 5.0–8.0)

## 2017-01-03 MED ORDER — SULFAMETHOXAZOLE-TRIMETHOPRIM 800-160 MG PO TABS: 1.0000 | ORAL_TABLET | Freq: Two times a day (BID) | ORAL | 0 refills | Status: AC

## 2017-01-03 NOTE — ED Provider Notes (Signed)
[facility]ARE CENTER   [id number] 01/03/17 Arrival Time: 1203   SUBJECTIVE:  Lisa Horne is a 41 y.o. female who presents to the urgent care with complaint of 2 days of suprapubic pressure, dysuria and frequency associated with nausea. No vomiting or flank pain Taking ibuprofen.    Past Medical History:  Diagnosis Date  . GERD (gastroesophageal reflux disease)   . Morbid obesity (HCC)   . PCOS (polycystic ovarian syndrome)   . PONV (postoperative nausea and vomiting)   . Thyroid disease    hypothyroidism   Family History  Problem Relation Age of Onset  . Hypertension Mother   . Cancer Mother        breast  . Depression Mother   . Diabetes Father   . Cancer Father        skin  . Depression Sister    Social History   Social History  . Marital status: Married    Spouse name: N/A  . Number of children: N/A  . Years of education: N/A   Occupational History  . Not on file.   Social History Main Topics  . Smoking status: Never Smoker  . Smokeless tobacco: Never Used  . Alcohol use No  . Drug use: No  . Sexual activity: Yes    Birth control/ protection: IUD   Other Topics Concern  . Not on file   Social History Narrative  . No narrative on file   No outpatient prescriptions have been marked as taking for the 01/03/17 encounter [facility] Encounter).   Allergies  Allergen Reactions  . Ciprofloxacin     REACTION: severe headache/vomiting  . Hydrochlorothiazide W-Triamterene     REACTION: malaise      ROS: As per HPI, remainder of ROS negative.   OBJECTIVE:   Vitals:   01/03/17 1217  BP: 119/81  Pulse: 79  Resp: 16  Temp: 98.7 F (37.1 C)  TempSrc: Oral  SpO2: 97%     General appearance: alert; no distress Eyes: PERRL; EOMI; conjunctiva normal HENT: normocephalic; atraumatic; TMs normal, canal normal, external ears normal without trauma; nasal mucosa normal; oral mucosa normal Neck: supple Abdomen: soft, non-tender; bowel sounds  normal; no masses or organomegaly; no guarding or rebound tenderness Back: no CVA tenderness Extremities: no cyanosis or edema; symmetrical with no gross deformities Skin: warm and dry Neurologic: normal gait; grossly normal Psychological: alert and cooperative; normal mood and affect    Labs:  Results for orders placed or performed during the hospital encounter of 01/03/17  POCT urinalysis dip (device)  Result Value Ref Range   Glucose, UA NEGATIVE NEGATIVE mg/dL   Bilirubin Urine NEGATIVE NEGATIVE   Ketones, ur NEGATIVE NEGATIVE mg/dL   Specific Gravity, Urine 1.015 1.005 - 1.030   Hgb urine dipstick TRACE (A) NEGATIVE   pH 7.0 5.0 - 8.0   Protein, ur NEGATIVE NEGATIVE mg/dL   Urobilinogen, UA 1.0 0.0 - 1.0 mg/dL   Nitrite NEGATIVE NEGATIVE   Leukocytes, UA NEGATIVE NEGATIVE    Labs Reviewed  POCT URINALYSIS DIP (DEVICE) - Abnormal; Notable for the following:       Result Value   Hgb urine dipstick TRACE (*)    All other components within normal limits  URINE CULTURE    No results found.     ASSESSMENT & PLAN:  1. Dysuria     Meds ordered this encounter  Medications  . sulfamethoxazole-trimethoprim (BACTRIM DS,SEPTRA DS) 800-160 MG tablet    Sig: Take 1 tablet by  mouth 2 (two) times daily.    Dispense:  14 tablet    Refill:  0    Reviewed expectations re: course of current medical issues. Questions answered. Outlined signs and symptoms indicating need for more acute intervention. Patient verbalized understanding. After Visit Summary given.    Procedures:      Elvina Sidle, MD 01/03/17 1240

## 2017-01-03 NOTE — Discharge Instructions (Signed)
Although the urine specimen does not show large amounts of infection, we are initiating treatment for a urinary tract infection and running a urine culture.  We should have further results by Tuesday.

## 2017-01-04 LAB — URINE CULTURE

## 2017-01-08 ENCOUNTER — Encounter: Payer: Self-pay | Admitting: Family Medicine

## 2017-01-09 ENCOUNTER — Encounter: Payer: Self-pay | Admitting: Family Medicine

## 2017-01-09 ENCOUNTER — Ambulatory Visit (INDEPENDENT_AMBULATORY_CARE_PROVIDER_SITE_OTHER): Payer: 59 | Admitting: Family Medicine

## 2017-01-09 VITALS — BP 128/84 | Temp 98.0°F | Ht 68.0 in | Wt 292.0 lb

## 2017-01-09 DIAGNOSIS — N390 Urinary tract infection, site not specified: Secondary | ICD-10-CM | POA: Diagnosis not present

## 2017-01-09 LAB — POC URINALSYSI DIPSTICK (AUTOMATED)
Bilirubin, UA: NEGATIVE
Glucose, UA: NEGATIVE
Ketones, UA: NEGATIVE
Leukocytes, UA: NEGATIVE
NITRITE UA: NEGATIVE
PH UA: 6 (ref 5.0–8.0)
PROTEIN UA: NEGATIVE
SPEC GRAV UA: 1.02 (ref 1.010–1.025)
UROBILINOGEN UA: NEGATIVE U/dL — AB

## 2017-01-09 MED ORDER — CYCLOBENZAPRINE HCL 10 MG PO TABS: 10.0000 mg | ORAL_TABLET | Freq: Three times a day (TID) | ORAL | 2 refills | Status: DC | PRN

## 2017-01-09 MED ORDER — NITROFURANTOIN MONOHYD MACRO 100 MG PO CAPS: 100.0000 mg | ORAL_CAPSULE | Freq: Two times a day (BID) | ORAL | 0 refills | Status: DC

## 2017-01-09 MED FILL — CYCLOBENZAPRINE 10 MG TABLE: 10 | 20 days supply | Qty: 60 | Fill #0

## 2017-01-09 MED FILL — NITROFURANTOIN MONO-MCR 100: 100 | 7 days supply | Qty: 14 | Fill #0

## 2017-01-09 NOTE — Addendum Note (Signed)
Addended by: Aniceto Boss A on: 01/09/2017 10:22 AM   Modules accepted: Orders

## 2017-01-09 NOTE — Patient Instructions (Signed)
WE NOW OFFER   Lisa Horne's FAST TRACK!!!  SAME DAY Appointments for ACUTE CARE  Such as: Sprains, Injuries, cuts, abrasions, rashes, muscle pain, joint pain, back pain Colds, flu, sore throats, headache, allergies, cough, fever  Ear pain, sinus and eye infections Abdominal pain, nausea, vomiting, diarrhea, upset stomach Animal/insect bites  3 Easy Ways to Schedule: Walk-In Scheduling Call in scheduling Mychart Sign-up: [email or web address]         

## 2017-01-09 NOTE — Progress Notes (Signed)
   Subjective:    Patient ID: Lisa Horne, female    DOB: 19-Nov-1975, 41 y.o.   MRN: [id number]  HPI Here to follow up a UTI diagnosed on 01-03-17 at Urgent Care. At that time she had fevers, lower abdominal pains, urgency to urinate and burning. No nausea or back pain. She was given Bactrim for 7 days. The urine culture was not able to isolate a bacteria. Today she feels better with no fever or urgency or burning, but she still has lower abdominal cramps. BMs are normal. She is drinking plenty of water.    Review of Systems  Constitutional: Negative.   Respiratory: Negative.   Cardiovascular: Negative.   Gastrointestinal: Positive for abdominal pain. Negative for abdominal distention, constipation, diarrhea, nausea and vomiting.  Genitourinary: Negative for dysuria, flank pain, hematuria and urgency.       Objective:   Physical Exam  Constitutional: She appears well-developed and well-nourished.  Cardiovascular: Normal rate, regular rhythm, normal heart sounds and intact distal pulses.   Pulmonary/Chest: Effort normal and breath sounds normal. No respiratory distress. She has no wheezes. She has no rales.  Abdominal: Soft. Bowel sounds are normal. She exhibits no distension and no mass. There is no rebound and no guarding.  Mildly tender across the lower abdomen           Assessment & Plan:  Partially treated UTI. Given 7 days of Macrobid. Recheck prn.  Gershon Crane, MD

## 2017-01-21 ENCOUNTER — Telehealth: Payer: Private Health Insurance - Indemnity | Admitting: Family

## 2017-01-21 DIAGNOSIS — R238 Other skin changes: Secondary | ICD-10-CM

## 2017-01-21 NOTE — Progress Notes (Signed)
Based on what you shared with me it looks like you have a serious condition that should be evaluated in a face to face office visit.  NOTE: Even if you have entered your credit card information for this eVisit, you will not be charged.   If you are having a true medical emergency please call 911.  If you need an urgent face to face visit, [facility] has four urgent care centers for your convenience.  If you need care fast and have a high deductible or no insurance consider:   [email or web address]  [phone]  [address], [address] [place], [place] [address] 8 am to 8 pm Monday-Friday 10 am to 4 pm Saturday-Sunday   The following sites will take your  insurance:    . [facility]  [phone] Get Driving Directions Find a Provider at this Location  [address] [place], [place] [address] . 10 am to 8 pm Monday-Friday . 12 pm to 8 pm Saturday-Sunday   . [facility]  [phone] Get Driving Directions Find a Provider at this Location  [address], [address] [place], [place] [address] . 8 am to 8 pm Monday-Friday . 9 am to 6 pm Saturday . 11 am to 6 pm Sunday   . [facility] [place]  [phone] Get Driving Directions  [address] [place], [place] [address] . 8 am to 8 pm Monday-Friday . 8 am to 4 pm Saturday-Sunday   Your e-visit answers were reviewed by a board certified advanced clinical practitioner to complete your personal care plan.  Thank you for using e-Visits.  

## 2017-02-06 DIAGNOSIS — Z124 Encounter for screening for malignant neoplasm of cervix: Secondary | ICD-10-CM | POA: Diagnosis not present

## 2017-02-06 DIAGNOSIS — Z01419 Encounter for gynecological examination (general) (routine) without abnormal findings: Secondary | ICD-10-CM | POA: Diagnosis not present

## 2017-02-06 DIAGNOSIS — R8290 Unspecified abnormal findings in urine: Secondary | ICD-10-CM | POA: Diagnosis not present

## 2017-02-06 DIAGNOSIS — Z1389 Encounter for screening for other disorder: Secondary | ICD-10-CM | POA: Diagnosis not present

## 2017-02-06 DIAGNOSIS — R102 Pelvic and perineal pain: Secondary | ICD-10-CM | POA: Diagnosis not present

## 2017-02-06 DIAGNOSIS — Z1151 Encounter for screening for human papillomavirus (HPV): Secondary | ICD-10-CM | POA: Diagnosis not present

## 2017-02-06 DIAGNOSIS — Z130 Encounter for screening for diseases of the blood and blood-forming organs and certain disorders involving the immune mechanism: Secondary | ICD-10-CM | POA: Diagnosis not present

## 2017-02-06 DIAGNOSIS — Z30431 Encounter for routine checking of intrauterine contraceptive device: Secondary | ICD-10-CM | POA: Diagnosis not present

## 2017-02-23 DIAGNOSIS — Z30431 Encounter for routine checking of intrauterine contraceptive device: Secondary | ICD-10-CM | POA: Diagnosis not present

## 2017-02-23 DIAGNOSIS — R109 Unspecified abdominal pain: Secondary | ICD-10-CM | POA: Diagnosis not present

## 2017-05-07 DIAGNOSIS — Z30432 Encounter for removal of intrauterine contraceptive device: Secondary | ICD-10-CM | POA: Diagnosis not present

## 2017-05-07 DIAGNOSIS — Z30430 Encounter for insertion of intrauterine contraceptive device: Secondary | ICD-10-CM | POA: Diagnosis not present

## 2017-06-08 ENCOUNTER — Encounter (HOSPITAL_COMMUNITY): Payer: Self-pay

## 2017-06-12 DIAGNOSIS — N859 Noninflammatory disorder of uterus, unspecified: Secondary | ICD-10-CM | POA: Diagnosis not present

## 2017-06-12 DIAGNOSIS — Z30431 Encounter for routine checking of intrauterine contraceptive device: Secondary | ICD-10-CM | POA: Diagnosis not present

## 2017-06-24 DIAGNOSIS — R935 Abnormal findings on diagnostic imaging of other abdominal regions, including retroperitoneum: Secondary | ICD-10-CM | POA: Diagnosis not present

## 2017-06-24 DIAGNOSIS — N83292 Other ovarian cyst, left side: Secondary | ICD-10-CM | POA: Diagnosis not present

## 2017-06-25 DIAGNOSIS — Z9884 Bariatric surgery status: Secondary | ICD-10-CM | POA: Diagnosis not present

## 2017-06-25 DIAGNOSIS — Z903 Acquired absence of stomach [part of]: Secondary | ICD-10-CM | POA: Diagnosis not present

## 2017-06-29 ENCOUNTER — Other Ambulatory Visit (HOSPITAL_COMMUNITY)
Admission: RE | Admit: 2017-06-29 | Discharge: 2017-06-29 | Disposition: A | Discharge status: Home / Self Care | Payer: 59 | Source: Ambulatory Visit | Attending: Surgery | Admitting: Surgery

## 2017-06-29 DIAGNOSIS — Z903 Acquired absence of stomach [part of]: Secondary | ICD-10-CM | POA: Diagnosis not present

## 2017-06-29 LAB — COMPREHENSIVE METABOLIC PANEL
ALBUMIN: 4.1 g/dL (ref 3.5–5.0)
ALT: 25 U/L (ref 14–54)
AST: 21 U/L (ref 15–41)
Alkaline Phosphatase: 54 U/L (ref 38–126)
Anion gap: 11 (ref 5–15)
BUN: 19 mg/dL (ref 6–20)
CALCIUM: 8.9 mg/dL (ref 8.9–10.3)
CO2: 21 mmol/L — AB (ref 22–32)
Chloride: 106 mmol/L (ref 101–111)
Creatinine, Ser: 0.77 mg/dL (ref 0.44–1.00)
GFR calc Af Amer: >60 mL/min (ref >60)
GFR calc non Af Amer: >60 mL/min (ref >60)
GLUCOSE: 106 mg/dL — AB (ref 65–99)
Potassium: 3.7 mmol/L (ref 3.5–5.1)
SODIUM: 138 mmol/L (ref 135–145)
Total Bilirubin: 1.1 mg/dL (ref 0.3–1.2)
Total Protein: 7.6 g/dL (ref 6.5–8.1)

## 2017-06-29 LAB — CBC WITH DIFFERENTIAL/PLATELET
Basophils Absolute: 0 10*3/uL (ref 0.0–0.1)
Basophils Relative: 1 %
EOS PCT: 2 %
Eosinophils Absolute: 0.1 10*3/uL (ref 0.0–0.7)
HEMATOCRIT: 41.4 % (ref 36.0–46.0)
Hemoglobin: 13.6 g/dL (ref 12.0–15.0)
LYMPHS ABS: 1.5 10*3/uL (ref 0.7–4.0)
LYMPHS PCT: 21 %
MCH: 29.8 pg (ref 26.0–34.0)
MCHC: 32.9 g/dL (ref 30.0–36.0)
MCV: 90.6 fL (ref 78.0–100.0)
MONO ABS: 0.8 10*3/uL (ref 0.1–1.0)
Monocytes Relative: 12 %
NEUTROS ABS: 4.5 10*3/uL (ref 1.7–7.7)
Neutrophils Relative %: 64 %
PLATELETS: 250 10*3/uL (ref 150–400)
RBC: 4.57 MIL/uL (ref 3.87–5.11)
RDW: 12.8 % (ref 11.5–15.5)
WBC: 7 10*3/uL (ref 4.0–10.5)

## 2017-06-29 LAB — VITAMIN B12: Vitamin B-12: 280 pg/mL (ref 180–914)

## 2017-06-29 LAB — FOLATE: FOLATE: 17.4 ng/mL (ref >5.9)

## 2017-06-29 LAB — IRON: Iron: 142 ug/dL (ref 28–170)

## 2017-06-30 ENCOUNTER — Other Ambulatory Visit: Payer: Self-pay | Admitting: Family Medicine

## 2017-06-30 LAB — CALCITRIOL (1,25 DI-OH VIT D): Vit D, 1,25-Dihydroxy: 35.2 pg/mL (ref 19.9–79.3)

## 2017-06-30 MED FILL — OMEPRAZOLE DR 40 MG CAPSULE: 40 | 30 days supply | Qty: 60 | Fill #0

## 2017-07-02 LAB — VITAMIN B1: Vitamin B1 (Thiamine): 112.2 nmol/L (ref 66.5–200.0)

## 2017-07-06 MED FILL — LEVOTHYROXINE 200 MCG TAB: 200 | 90 days supply | Qty: 90 | Fill #1

## 2017-08-25 MED FILL — CYCLOBENZAPRINE 10 MG TAB: 10 | 20 days supply | Qty: 60 | Fill #1

## 2017-09-02 ENCOUNTER — Ambulatory Visit (INDEPENDENT_AMBULATORY_CARE_PROVIDER_SITE_OTHER): Payer: Self-pay | Admitting: Family Medicine

## 2017-09-02 VITALS — BP 118/80 | HR 85 | Temp 99.2°F | Resp 18 | Wt 291.2 lb

## 2017-09-02 DIAGNOSIS — L259 Unspecified contact dermatitis, unspecified cause: Secondary | ICD-10-CM

## 2017-09-02 MED ORDER — HYDROXYZINE HCL 25 MG PO TABS: 25.0000 mg | ORAL_TABLET | Freq: Three times a day (TID) | ORAL | 0 refills | Status: DC

## 2017-09-02 MED ORDER — TRIAMCINOLONE ACETONIDE 0.1 % EX CREA: 1.0000 "application " | TOPICAL_CREAM | Freq: Two times a day (BID) | CUTANEOUS | 0 refills | Status: AC

## 2017-09-02 MED FILL — TRIAMCINOLONE 0.1% CREAM: 0.1 | 14 days supply | Qty: 28 | Fill #0

## 2017-09-02 MED FILL — hydrOXYzine HCL 25 MG TABS: 25 | 10 days supply | Qty: 30 | Fill #0

## 2017-09-02 NOTE — Patient Instructions (Signed)
Contact Dermatitis Dermatitis is redness, soreness, and swelling (inflammation) of the skin. Contact dermatitis is a reaction to certain substances that touch the skin. You either touched something that irritated your skin, or you have allergies to something you touched. Follow these instructions at home: Skin Care  Moisturize your skin as needed.  Apply cool compresses to the affected areas.  Try taking a bath with: ? Epsom salts. Follow the instructions on the package. You can get these at a pharmacy or grocery store. ? Baking soda. Pour a small amount into the bath as told by your doctor. ? Colloidal oatmeal. Follow the instructions on the package. You can get this at a pharmacy or grocery store.  Try applying baking soda paste to your skin. Stir water into baking soda until it looks like paste.  Do not scratch your skin.  Bathe less often.  Bathe in lukewarm water. Avoid using hot water. Medicines  Take or apply over-the-counter and prescription medicines only as told by your doctor.  If you were prescribed an antibiotic medicine, take or apply your antibiotic as told by your doctor. Do not stop taking the antibiotic even if your condition starts to get better. General instructions  Keep all follow-up visits as told by your doctor. This is important.  Avoid the substance that caused your reaction. If you do not know what caused it, keep a journal to try to track what caused it. Write down: ? What you eat. ? What cosmetic products you use. ? What you drink. ? What you wear in the affected area. This includes jewelry.  If you were given a bandage (dressing), take care of it as told by your doctor. This includes when to change and remove it. Contact a doctor if:  You do not get better with treatment.  Your condition gets worse.  You have signs of infection such as: ? Swelling. ? Tenderness. ? Redness. ? Soreness. ? Warmth.  You have a fever.  You have new  symptoms. Get help right away if:  You have a very bad headache.  You have neck pain.  Your neck is stiff.  You throw up (vomit).  You feel very sleepy.  You see red streaks coming from the affected area.  Your bone or joint underneath the affected area becomes painful after the skin has healed.  The affected area turns darker.  You have trouble breathing. This information is not intended to replace advice given to you by your health care provider. Make sure you discuss any questions you have with your health care provider. Document Released: 02/02/2009 Document Revised: 09/13/2015 Document Reviewed: 08/23/2014 Elsevier Interactive Patient Education  2018 Elsevier Inc.  

## 2017-09-02 NOTE — Progress Notes (Signed)
Lisa Horne is a 42 y.o. female who presents today with concerns of an itchy rash that has been present about 7-10 days. She is unaware of the cause and denies any other household members with similar symptoms. She does report some yard work- no sleeing out of the homes in hotels or for travel and patient denies house guests denies risk for pests.  Review of Systems  Constitutional: Negative for chills, fever and malaise/fatigue.  HENT: Negative for congestion, ear discharge, ear pain, sinus pain and sore throat.   Eyes: Negative.   Respiratory: Negative for cough, sputum production and shortness of breath.   Cardiovascular: Negative.  Negative for chest pain.  Gastrointestinal: Negative for abdominal pain, diarrhea, nausea and vomiting.  Genitourinary: Negative for dysuria, frequency, hematuria and urgency.  Musculoskeletal: Negative for myalgias.  Skin: Positive for itching and rash.  Neurological: Negative for headaches.  Endo/Heme/Allergies: Negative.   Psychiatric/Behavioral: Negative.     O: Vitals:   09/02/17 1449  BP: 118/80  Pulse: 85  Resp: 18  Temp: 99.2 F (37.3 C)  SpO2: 96%     Physical Exam  Constitutional: She is oriented to person, place, and time. Vital signs are normal. She appears well-developed and well-nourished. She is active.  Non-toxic appearance. She does not have a sickly appearance.  HENT:  Head: Normocephalic.  Right Ear: Hearing, tympanic membrane, external ear and ear canal normal.  Left Ear: Hearing, tympanic membrane, external ear and ear canal normal.  Nose: Nose normal.  Mouth/Throat: Uvula is midline and oropharynx is clear and moist.  Neck: Normal range of motion. Neck supple.  Cardiovascular: Normal rate, regular rhythm, normal heart sounds and normal pulses.  Pulmonary/Chest: Effort normal and breath sounds normal.  Abdominal: Soft. Bowel sounds are normal.  Musculoskeletal: Normal range of motion.  Lymphadenopathy:       Head  (right side): No submental and no submandibular adenopathy present.       Head (left side): No submental and no submandibular adenopathy present.    She has no cervical adenopathy.  Neurological: She is alert and oriented to person, place, and time.  Skin: Skin is warm. Laceration, lesion and rash noted. No abrasion, no bruising, no burn, no ecchymosis and no petechiae noted. Rash is maculopapular. Rash is not macular and not papular. There is erythema. Nails show no clubbing.     Bilateral erythremic maculopapular lesions in arm folds, right abdominal worse than left- no involvement of back or below the waist. No evidence of streaking or infection. Lesions do not appear to have fungal characteristic of scaly, flaking with raised edges.  Psychiatric: She has a normal mood and affect.  Vitals reviewed.   A: 1. Contact dermatitis, unspecified contact dermatitis type, unspecified trigger    P: Exam findings, diagnosis etiology and medication use and indications reviewed with patient. Advised to trial topical steroids for 48 hours and monitor for improvement if none see recommend in person f/u or PCP/derm eval.   Follow- Up and discharge instructions provided. No emergent/urgent issues found on exam.  Patient verbalized understanding of information provided and agrees with plan of care (POC), all questions answered.  1. Contact dermatitis, unspecified contact dermatitis type, unspecified trigger  - triamcinolone cream (KENALOG) 0.1 %; Apply 1 application topically 2 (two) times daily for 14 days. - hydrOXYzine (ATARAX/VISTARIL) 25 MG tablet; Take 1 tablet (25 mg total) by mouth 3 (three) times daily.

## 2017-12-03 ENCOUNTER — Ambulatory Visit: Payer: 59 | Admitting: Family Medicine

## 2017-12-03 ENCOUNTER — Encounter: Payer: Self-pay | Admitting: Family Medicine

## 2017-12-03 VITALS — BP 118/78 | HR 95 | Temp 98.2°F | Ht 68.0 in | Wt 297.2 lb

## 2017-12-03 DIAGNOSIS — S46911A Strain of unspecified muscle, fascia and tendon at shoulder and upper arm level, right arm, initial encounter: Secondary | ICD-10-CM | POA: Diagnosis not present

## 2017-12-03 MED ORDER — TRAMADOL HCL 50 MG PO TABS: 100.0000 mg | ORAL_TABLET | Freq: Four times a day (QID) | ORAL | 1 refills | Status: DC | PRN

## 2017-12-03 MED FILL — traMADol HCL 50 MG TABS: 50 | 7 days supply | Qty: 60 | Fill #0

## 2017-12-03 NOTE — Progress Notes (Signed)
   Subjective:    Patient ID: Lisa Horne, female    DOB: 10-14-1975, 42 y.o.   MRN: [id number]  HPI Here for right shoulder pain. On 11-21-17 while checking on her sleeping child in the back of her husband's pickup truck her foot slipped off the railing and she fell to the ground, landing on her right side. She cannot remember exactly how she landed. She felt only mild soreness that evening but by the next morning she had significant pain in the shoulder. She applied ice for several days. She has been taking Ibuprofen with little relief. No neck or back pain. She feels the most pain in the lateral shoulder.    Review of Systems  Constitutional: Negative.   Respiratory: Negative.   Cardiovascular: Negative.   Musculoskeletal: Positive for arthralgias.  Neurological: Negative.        Objective:   Physical Exam  Constitutional: She is oriented to person, place, and time.  In pain, splinting her right arm to the body   Cardiovascular: Normal rate, regular rhythm, normal heart sounds and intact distal pulses.  Pulmonary/Chest: Effort normal and breath sounds normal.  Musculoskeletal:  She is mildly tender over the right lateral shoulder. ROM is quite limited by pain. No crepitus. She cannot abduct the arm to horizontal due to pain.  Neurological: She is alert and oriented to person, place, and time.          Assessment & Plan:  Right shoulder pain after a fall, likely a rotator cuff injury. Use Tramadol for pain. Refer to Orthopedics.  Gershon CraneStephen Bernadine Melecio, MD

## 2017-12-09 DIAGNOSIS — M25511 Pain in right shoulder: Secondary | ICD-10-CM | POA: Diagnosis not present

## 2017-12-09 MED FILL — MELOXICAM 15 MG TABLET: 15 | 30 days supply | Qty: 30 | Fill #0

## 2018-01-06 DIAGNOSIS — M25511 Pain in right shoulder: Secondary | ICD-10-CM | POA: Diagnosis not present

## 2018-02-04 ENCOUNTER — Other Ambulatory Visit (HOSPITAL_COMMUNITY): Payer: Self-pay | Admitting: Orthopedic Surgery

## 2018-02-04 DIAGNOSIS — M25512 Pain in left shoulder: Secondary | ICD-10-CM

## 2018-02-17 ENCOUNTER — Encounter: Payer: Self-pay | Admitting: Family Medicine

## 2018-02-17 ENCOUNTER — Ambulatory Visit (INDEPENDENT_AMBULATORY_CARE_PROVIDER_SITE_OTHER): Payer: 59 | Admitting: Family Medicine

## 2018-02-17 VITALS — BP 120/82 | HR 69 | Temp 98.6°F | Ht 68.0 in | Wt 300.5 lb

## 2018-02-17 DIAGNOSIS — E039 Hypothyroidism, unspecified: Secondary | ICD-10-CM | POA: Diagnosis not present

## 2018-02-17 DIAGNOSIS — R739 Hyperglycemia, unspecified: Secondary | ICD-10-CM

## 2018-02-17 DIAGNOSIS — Z0000 Encounter for general adult medical examination without abnormal findings: Secondary | ICD-10-CM | POA: Diagnosis not present

## 2018-02-17 MED ORDER — BARIATRIC MULTIVITAMINS/IRON PO CAPS: 1.0000 | ORAL_CAPSULE | Freq: Every day | ORAL | 3 refills | Status: DC

## 2018-02-17 MED ORDER — METFORMIN HCL 500 MG PO TABS: 500.0000 mg | ORAL_TABLET | Freq: Two times a day (BID) | ORAL | 3 refills | Status: DC

## 2018-02-17 MED ORDER — OMEPRAZOLE 40 MG PO CPDR: 40.0000 mg | DELAYED_RELEASE_CAPSULE | Freq: Two times a day (BID) | ORAL | 3 refills | Status: DC

## 2018-02-17 MED ORDER — LEVOTHYROXINE SODIUM 200 MCG PO TABS: ORAL_TABLET | ORAL | 3 refills | Status: DC

## 2018-02-17 MED ORDER — CYCLOBENZAPRINE HCL 10 MG PO TABS: 10.0000 mg | ORAL_TABLET | Freq: Three times a day (TID) | ORAL | 3 refills | Status: DC | PRN

## 2018-02-17 MED FILL — metFORMIN HCL 500 MG TABS: 500 | 90 days supply | Qty: 180 | Fill #0

## 2018-02-17 MED FILL — OMEPRAZOLE 40 MG CPDR: 40 | 90 days supply | Qty: 180 | Fill #0

## 2018-02-17 MED FILL — CYCLOBENZAPRINE HCL 10 MG T: 10 | 90 days supply | Qty: 270 | Fill #0

## 2018-02-17 MED FILL — BARIATRIC ADV ULT MULT IRON: 90 days supply | Qty: 270 | Fill #0

## 2018-02-17 MED FILL — LEVOTHYROXINE 200 MCG TAB: 200 | 90 days supply | Qty: 90 | Fill #0

## 2018-02-17 NOTE — Progress Notes (Signed)
   Subjective:    Patient ID: Lisa Horne, female    DOB: 1975/06/22, 42 y.o.   MRN: [id number]  HPI Here for a well exam. She is doing well except for left shoulder pain. She is seeing Dr. Mack Hook for this, and she has an MRI pending on it. She still walks 1.5 miles a day.    Review of Systems  Constitutional: Negative.   HENT: Negative.   Eyes: Negative.   Respiratory: Negative.   Cardiovascular: Negative.   Gastrointestinal: Negative.   Genitourinary: Negative for decreased urine volume, difficulty urinating, dyspareunia, dysuria, enuresis, flank pain, frequency, hematuria, pelvic pain and urgency.  Musculoskeletal: Negative.   Skin: Negative.   Neurological: Negative.   Psychiatric/Behavioral: Negative.        Objective:   Physical Exam  Constitutional: She is oriented to person, place, and time. She appears well-developed and well-nourished. No distress.  HENT:  Head: Normocephalic and atraumatic.  Right Ear: External ear normal.  Left Ear: External ear normal.  Nose: Nose normal.  Mouth/Throat: Oropharynx is clear and moist. No oropharyngeal exudate.  Eyes: Pupils are equal, round, and reactive to light. Conjunctivae and EOM are normal. No scleral icterus.  Neck: Normal range of motion. Neck supple. No JVD present. No thyromegaly present.  Cardiovascular: Normal rate, regular rhythm, normal heart sounds and intact distal pulses. Exam reveals no gallop and no friction rub.  No murmur heard. Pulmonary/Chest: Effort normal and breath sounds normal. No respiratory distress. She has no wheezes. She has no rales. She exhibits no tenderness.  Abdominal: Soft. Bowel sounds are normal. She exhibits no distension and no mass. There is no tenderness. There is no rebound and no guarding.  Musculoskeletal: Normal range of motion. She exhibits no edema or tenderness.  Lymphadenopathy:    She has no cervical adenopathy.  Neurological: She is alert and oriented to person,  place, and time. She has normal reflexes. She displays normal reflexes. No cranial nerve deficit. She exhibits normal muscle tone. Coordination normal.  Skin: Skin is warm and dry. No rash noted. No erythema.  Psychiatric: She has a normal mood and affect. Her behavior is normal. Judgment and thought content normal.          Assessment & Plan:  Well exam. We discussed diet and exercise. Start on a daily multivitamin. Get fasting labs.  Gershon Crane, MD

## 2018-02-18 LAB — HEMOGLOBIN A1C: HEMOGLOBIN A1C: 5.9 % (ref 4.6–6.5)

## 2018-02-18 LAB — CBC WITH DIFFERENTIAL/PLATELET
BASOS PCT: 0.9 % (ref 0.0–3.0)
Basophils Absolute: 0.1 10*3/uL (ref 0.0–0.1)
EOS ABS: 0.2 10*3/uL (ref 0.0–0.7)
EOS PCT: 2.5 % (ref 0.0–5.0)
HCT: 42.4 % (ref 36.0–46.0)
Hemoglobin: 14.5 g/dL (ref 12.0–15.0)
LYMPHS ABS: 1.5 10*3/uL (ref 0.7–4.0)
Lymphocytes Relative: 20.6 % (ref 12.0–46.0)
MCHC: 34.1 g/dL (ref 30.0–36.0)
MCV: 89.7 fl (ref 78.0–100.0)
MONO ABS: 0.8 10*3/uL (ref 0.1–1.0)
Monocytes Relative: 10.9 % (ref 3.0–12.0)
NEUTROS PCT: 65.1 % (ref 43.0–77.0)
Neutro Abs: 4.7 10*3/uL (ref 1.4–7.7)
Platelets: 227 10*3/uL (ref 150.0–400.0)
RBC: 4.73 Mil/uL (ref 3.87–5.11)
RDW: 12.8 % (ref 11.5–15.5)
WBC: 7.3 10*3/uL (ref 4.0–10.5)

## 2018-02-18 LAB — T4, FREE: Free T4: 1.12 ng/dL (ref 0.60–1.60)

## 2018-02-18 LAB — HEPATIC FUNCTION PANEL
ALK PHOS: 56 U/L (ref 39–117)
ALT: 23 U/L (ref 0–35)
AST: 16 U/L (ref 0–37)
Albumin: 4.1 g/dL (ref 3.5–5.2)
BILIRUBIN DIRECT: 0.1 mg/dL (ref 0.0–0.3)
Total Bilirubin: 0.7 mg/dL (ref 0.2–1.2)
Total Protein: 6.9 g/dL (ref 6.0–8.3)

## 2018-02-18 LAB — LIPID PANEL
Cholesterol: 208 mg/dL — ABNORMAL HIGH (ref 0–200)
HDL: 40.4 mg/dL (ref >39.00)
LDL Cholesterol: 147 mg/dL — ABNORMAL HIGH (ref 0–99)
NONHDL: 167.39
Total CHOL/HDL Ratio: 5
Triglycerides: 102 mg/dL (ref 0.0–149.0)
VLDL: 20.4 mg/dL (ref 0.0–40.0)

## 2018-02-18 LAB — BASIC METABOLIC PANEL
BUN: 16 mg/dL (ref 6–23)
CO2: 29 meq/L (ref 19–32)
CREATININE: 0.87 mg/dL (ref 0.40–1.20)
Calcium: 9.2 mg/dL (ref 8.4–10.5)
Chloride: 104 mEq/L (ref 96–112)
GFR: 74.9 mL/min (ref >60.00)
Glucose, Bld: 108 mg/dL — ABNORMAL HIGH (ref 70–99)
POTASSIUM: 4.4 meq/L (ref 3.5–5.1)
Sodium: 139 mEq/L (ref 135–145)

## 2018-02-18 LAB — POC URINALSYSI DIPSTICK (AUTOMATED)
Glucose, UA: NEGATIVE
KETONES UA: NEGATIVE
Leukocytes, UA: NEGATIVE
Nitrite, UA: NEGATIVE
PH UA: 5.5 (ref 5.0–8.0)
PROTEIN UA: NEGATIVE
Urobilinogen, UA: 0.2 E.U./dL

## 2018-02-18 LAB — TSH: TSH: 1.06 u[IU]/mL (ref 0.35–4.50)

## 2018-02-18 LAB — T3, FREE: T3, Free: 3.4 pg/mL (ref 2.3–4.2)

## 2018-02-18 NOTE — Addendum Note (Signed)
Addended by: Bonnye Fava on: 02/18/2018 07:40 AM   Modules accepted: Orders

## 2018-02-23 ENCOUNTER — Encounter: Payer: Self-pay | Admitting: Family Medicine

## 2018-02-26 ENCOUNTER — Ambulatory Visit (INDEPENDENT_AMBULATORY_CARE_PROVIDER_SITE_OTHER): Payer: Self-pay | Admitting: Nurse Practitioner

## 2018-02-26 VITALS — BP 130/86 | HR 78 | Temp 98.4°F | Resp 20 | Wt 300.0 lb

## 2018-02-26 DIAGNOSIS — J0140 Acute pansinusitis, unspecified: Secondary | ICD-10-CM

## 2018-02-26 MED ORDER — FLUTICASONE PROPIONATE 50 MCG/ACT NA SUSP: 2.0000 | Freq: Every day | NASAL | 0 refills | Status: DC

## 2018-02-26 MED ORDER — BENZONATATE 200 MG PO CAPS: 200.0000 mg | ORAL_CAPSULE | Freq: Three times a day (TID) | ORAL | 0 refills | Status: AC | PRN

## 2018-02-26 MED ORDER — AMOXICILLIN-POT CLAVULANATE 875-125 MG PO TABS: 1.0000 | ORAL_TABLET | Freq: Two times a day (BID) | ORAL | 0 refills | Status: AC

## 2018-02-26 MED ORDER — HYDROCODONE-HOMATROPINE 5-1.5 MG/5ML PO SYRP: 5.0000 mL | ORAL_SOLUTION | Freq: Three times a day (TID) | ORAL | 0 refills | Status: AC | PRN

## 2018-02-26 MED FILL — AMOX-CLAV 875-125 MG TABLET: 875-125 | 10 days supply | Qty: 20 | Fill #0

## 2018-02-26 MED FILL — BENZONATATE 200 MG CAPS: 200 | 10 days supply | Qty: 30 | Fill #0

## 2018-02-26 MED FILL — HYDROCODONE-HOMATROPINE SYR: 5-1.5 | 7 days supply | Qty: 105 | Fill #0

## 2018-02-26 MED FILL — FLUTICASONE PROP 50 MCG SPR: 50 | 30 days supply | Qty: 16 | Fill #0

## 2018-02-26 NOTE — Progress Notes (Signed)
Subjective:     Lisa Horne is a 42 y.o. female who presents for evaluation of symptoms of a URI. Symptoms include left ear pressure/pain, fever up to 102, fever-duration 5  days, nasal congestion, post nasal drip, productive cough with  green colored sputum, purulent nasal discharge, sinus pressure and sore throat. Onset of symptoms was 6 days ago, and has been gradually worsening since that time. Treatment to date: Dayquil, Nyquil and Mucinex.  The following portions of the patient's history were reviewed and updated as appropriate: allergies, current medications and past medical history.  Review of Systems Constitutional: positive for anorexia, fatigue and fevers, negative for malaise, night sweats, sweats and weight loss Eyes: negative Ears, nose, mouth, throat, and face: positive for nasal congestion, sore throat and bilateral ear pain/pressure, negative for ear drainage, earaches and hoarseness Respiratory: positive for cough, dyspnea on exertion, sputum and wheezing, negative for asthma, chronic bronchitis, hemoptysis and pleurisy/chest pain Cardiovascular: negative Gastrointestinal: positive for nausea and decreased appetite, negative for abdominal pain, diarrhea and vomiting Neurological: positive for headaches, negative for coordination problems, dizziness, gait problems, paresthesia, vertigo and weakness Allergic/Immunologic: negative   Objective:    BP 130/86 (BP Location: Right Arm, Patient Position: Sitting, Cuff Size: Large)   Pulse 78   Temp 98.4 F (36.9 C) (Oral)   Resp 20   Wt 300 lb (136.1 kg)   SpO2 97%   BMI 45.61 kg/m  General appearance: alert, cooperative, fatigued and no distress Head: Normocephalic, without obvious abnormality, atraumatic Eyes: conjunctivae/corneas clear. PERRL, EOM's intact. Fundi benign. Ears: normal TM and external ear canal right ear and abnormal TM left ear - mucoid middle ear fluid Nose: no discharge, moderate congestion,  turbinates swollen, inflamed, moderate maxillary sinus tenderness left, moderate frontal sinus tenderness left Throat: lips, mucosa, and tongue normal; teeth and gums normal Lungs: clear to auscultation bilaterally Heart: regular rate and rhythm, S1, S2 normal, no murmur, click, rub or gallop Abdomen: soft, non-tender; bowel sounds normal; no masses,  no organomegaly Pulses: 2+ and symmetric Skin: Skin color, texture, turgor normal. No rashes or lesions Lymph nodes: cervical and submandibular nodes normal Neurologic: Grossly normal   Assessment:   Acute Pansinusitis  Plan:   Exam findings, diagnosis etiology and medication use and indications reviewed with patient. Follow- Up and discharge instructions provided. No emergent/urgent issues found on exam. Patient education was provided. Patient verbalized understanding of information provided and agrees with plan of care (POC), all questions answered. The patient is advised to call or return to clinic if condition does not see an improvement in symptoms, or to seek the care of the closest emergency department if condition worsens with the above plan.   1. Acute pansinusitis, recurrence not specified  - amoxicillin-clavulanate (AUGMENTIN) 875-125 MG tablet; Take 1 tablet by mouth 2 (two) times daily for 10 days.  Dispense: 20 tablet; Refill: 0 - fluticasone (FLONASE) 50 MCG/ACT nasal spray; Place 2 sprays into both nostrils daily for 10 days.  Dispense: 16 g; Refill: 0 - benzonatate (TESSALON) 200 MG capsule; Take 1 capsule (200 mg total) by mouth 3 (three) times daily as needed for up to 10 days for cough.  Dispense: 30 capsule; Refill: 0 - HYDROcodone-homatropine (HYCODAN) 5-1.5 MG/5ML syrup; Take 5 mLs by mouth every 8 (eight) hours as needed for up to 7 days for cough.  Dispense: 105 mL; Refill: 0 -Take medication as prescribed. -Ibuprofen or Tylenol for pain, fever, or general discomfort. -Increase fluids. -Sleep elevated on at least  2  pillows at bedtime to help with cough. -Use a humidifier or vaporizer when at home and during sleep. -May use a teaspoon of honey or over-the-counter cough drops to help with cough. -May use normal saline nasal spray to help with nasal congestion throughout the day. -Follow-up if symptoms do not improve.

## 2018-02-26 NOTE — Patient Instructions (Addendum)
Sinusitis, Adult -Take medication as prescribed. -Ibuprofen or Tylenol for pain, fever, or general discomfort. -Increase fluids. -Sleep elevated on at least 2 pillows at bedtime to help with cough. -Use a humidifier or vaporizer when at home and during sleep. -May use a teaspoon of honey or over-the-counter cough drops to help with cough. -May use normal saline nasal spray to help with nasal congestion throughout the day. -Follow-up if symptoms do not improve.  Sinusitis is soreness and inflammation of your sinuses. Sinuses are hollow spaces in the bones around your face. Your sinuses are located:  Around your eyes.  In the middle of your forehead.  Behind your nose.  In your cheekbones.  Your sinuses and nasal passages are lined with a stringy fluid (mucus). Mucus normally drains out of your sinuses. When your nasal tissues become inflamed or swollen, the mucus can become trapped or blocked so air cannot flow through your sinuses. This allows bacteria, viruses, and funguses to grow, which leads to infection. Sinusitis can develop quickly and last for 7?10 days (acute) or for more than 12 weeks (chronic). Sinusitis often develops after a cold. What are the causes? This condition is caused by anything that creates swelling in the sinuses or stops mucus from draining, including:  Allergies.  Asthma.  Bacterial or viral infection.  Abnormally shaped bones between the nasal passages.  Nasal growths that contain mucus (nasal polyps).  Narrow sinus openings.  Pollutants, such as chemicals or irritants in the air.  A foreign object stuck in the nose.  A fungal infection. This is rare.  What increases the risk? The following factors may make you more likely to develop this condition:  Having allergies or asthma.  Having had a recent cold or respiratory tract infection.  Having structural deformities or blockages in your nose or sinuses.  Having a weak immune  system.  Doing a lot of swimming or diving.  Overusing nasal sprays.  Smoking.  What are the signs or symptoms? The main symptoms of this condition are pain and a feeling of pressure around the affected sinuses. Other symptoms include:  Upper toothache.  Earache.  Headache.  Bad breath.  Decreased sense of smell and taste.  A cough that may get worse at night.  Fatigue.  Fever.  Thick drainage from your nose. The drainage is often green and it may contain pus (purulent).  Stuffy nose or congestion.  Postnasal drip. This is when extra mucus collects in the throat or back of the nose.  Swelling and warmth over the affected sinuses.  Sore throat.  Sensitivity to light.  How is this diagnosed? This condition is diagnosed based on symptoms, a medical history, and a physical exam. To find out if your condition is acute or chronic, your health care provider may:  Look in your nose for signs of nasal polyps.  Tap over the affected sinus to check for signs of infection.  View the inside of your sinuses using an imaging device that has a light attached (endoscope).  If your health care provider suspects that you have chronic sinusitis, you may also:  Be tested for allergies.  Have a sample of mucus taken from your nose (nasal culture) and checked for bacteria.  Have a mucus sample examined to see if your sinusitis is related to an allergy.  If your sinusitis does not respond to treatment and it lasts longer than 8 weeks, you may have an MRI or CT scan to check your sinuses. These scans   also help to determine how severe your infection is. In rare cases, a bone biopsy may be done to rule out more serious types of fungal sinus disease. How is this treated? Treatment for sinusitis depends on the cause and whether your condition is chronic or acute. If a virus is causing your sinusitis, your symptoms will go away on their own within 10 days. You may be given medicines to  relieve your symptoms, including:  Topical nasal decongestants. They shrink swollen nasal passages and let mucus drain from your sinuses.  Antihistamines. These drugs block inflammation that is triggered by allergies. This can help to ease swelling in your nose and sinuses.  Topical nasal corticosteroids. These are nasal sprays that ease inflammation and swelling in your nose and sinuses.  Nasal saline washes. These rinses can help to get rid of thick mucus in your nose.  If your condition is caused by bacteria, you will be given an antibiotic medicine. If your condition is caused by a fungus, you will be given an antifungal medicine. Surgery may be needed to correct underlying conditions, such as narrow nasal passages. Surgery may also be needed to remove polyps. Follow these instructions at home: Medicines  Take, use, or apply over-the-counter and prescription medicines only as told by your health care provider. These may include nasal sprays.  If you were prescribed an antibiotic medicine, take it as told by your health care provider. Do not stop taking the antibiotic even if you start to feel better. Hydrate and Humidify  Drink enough water to keep your urine clear or pale yellow. Staying hydrated will help to thin your mucus.  Use a cool mist humidifier to keep the humidity level in your home above 50%.  Inhale steam for 10-15 minutes, 3-4 times a day or as told by your health care provider. You can do this in the bathroom while a hot shower is running.  Limit your exposure to cool or dry air. Rest  Rest as much as possible.  Sleep with your head raised (elevated).  Make sure to get enough sleep each night. General instructions  Apply a warm, moist washcloth to your face 3-4 times a day or as told by your health care provider. This will help with discomfort.  Wash your hands often with soap and water to reduce your exposure to viruses and other germs. If soap and water are  not available, use hand sanitizer.  Do not smoke. Avoid being around people who are smoking (secondhand smoke).  Keep all follow-up visits as told by your health care provider. This is important. Contact a health care provider if:  You have a fever.  Your symptoms get worse.  Your symptoms do not improve within 10 days. Get help right away if:  You have a severe headache.  You have persistent vomiting.  You have pain or swelling around your face or eyes.  You have vision problems.  You develop confusion.  Your neck is stiff.  You have trouble breathing. This information is not intended to replace advice given to you by your health care provider. Make sure you discuss any questions you have with your health care provider. Document Released: 04/07/2005 Document Revised: 12/02/2015 Document Reviewed: 01/31/2015 Elsevier Interactive Patient Education  2018 [employer]  

## 2018-03-16 ENCOUNTER — Other Ambulatory Visit (HOSPITAL_COMMUNITY): Payer: Self-pay | Admitting: Orthopedic Surgery

## 2018-03-16 ENCOUNTER — Ambulatory Visit (HOSPITAL_COMMUNITY)
Admission: RE | Admit: 2018-03-16 | Discharge: 2018-03-16 | Disposition: A | Discharge status: Home / Self Care | Payer: 59 | Source: Ambulatory Visit | Attending: Orthopedic Surgery | Admitting: Orthopedic Surgery

## 2018-03-16 DIAGNOSIS — M25512 Pain in left shoulder: Secondary | ICD-10-CM

## 2018-03-16 DIAGNOSIS — M25511 Pain in right shoulder: Secondary | ICD-10-CM

## 2018-03-16 MED ORDER — SODIUM CHLORIDE (PF) 0.9 % IJ SOLN
10.0000 mL | Freq: Once | INTRAMUSCULAR | Status: AC
  Administered 2018-03-16: 7 mL

## 2018-03-16 MED ORDER — IOPAMIDOL (ISOVUE-M 200) INJECTION 41%
20.0000 mL | Freq: Once | INTRAMUSCULAR | Status: AC
  Administered 2018-03-16: 7 mL via INTRA_ARTICULAR

## 2018-03-16 MED ORDER — IOPAMIDOL (ISOVUE-M 200) INJECTION 41%
INTRAMUSCULAR | Status: AC
  Filled 2018-03-16: qty 10

## 2018-03-16 MED ORDER — GADOBENATE DIMEGLUMINE 529 MG/ML IV SOLN
5.0000 mL | Freq: Once | INTRAVENOUS | Status: AC | PRN
  Administered 2018-03-16: 0.1 mL via INTRA_ARTICULAR

## 2018-03-16 MED ORDER — SODIUM CHLORIDE (PF) 0.9 % IJ SOLN
INTRAMUSCULAR | Status: AC
  Administered 2018-03-16: 7 mL
  Filled 2018-03-16: qty 10

## 2018-03-16 MED ORDER — LIDOCAINE HCL (PF) 1 % IJ SOLN
INTRAMUSCULAR | Status: AC
  Administered 2018-03-16: 5 mL via INTRADERMAL
  Filled 2018-03-16: qty 5

## 2018-03-16 MED ORDER — LIDOCAINE HCL (PF) 1 % IJ SOLN
5.0000 mL | Freq: Once | INTRAMUSCULAR | Status: AC
  Administered 2018-03-16: 5 mL via INTRADERMAL

## 2018-04-16 MED FILL — PENICILLIN VK 500 MG TABLET: 500 | 10 days supply | Qty: 30 | Fill #0

## 2018-04-16 MED FILL — IBUPROFEN 600 MG TABLET: 600 | 3 days supply | Qty: 10 | Fill #0

## 2018-04-20 DIAGNOSIS — H524 Presbyopia: Secondary | ICD-10-CM | POA: Diagnosis not present

## 2018-07-04 ENCOUNTER — Ambulatory Visit (INDEPENDENT_AMBULATORY_CARE_PROVIDER_SITE_OTHER): Payer: Self-pay | Admitting: Nurse Practitioner

## 2018-07-04 VITALS — BP 120/84 | HR 77 | Temp 98.4°F | Resp 16 | Wt 308.0 lb

## 2018-07-04 DIAGNOSIS — B029 Zoster without complications: Secondary | ICD-10-CM

## 2018-07-04 MED ORDER — FAMCICLOVIR 500 MG PO TABS: 500.0000 mg | ORAL_TABLET | Freq: Three times a day (TID) | ORAL | 0 refills | Status: AC

## 2018-07-04 MED ORDER — PREDNISONE 20 MG PO TABS: 20.0000 mg | ORAL_TABLET | Freq: Every day | ORAL | 0 refills | Status: DC

## 2018-07-04 NOTE — Patient Instructions (Signed)
Shingles -Take medication as prescribed. -Ibuprofen or Tylenol for pain, fever or general discomfort. -Apply cold, wet cloths (cold compresses) to the area of the rash or blisters as told by your health care provider. -If blisters form and they begin to ooze, you will need to remain home from work.  Keep your head covered if this occurs. -Do not scratch or rub your head. -Continue to monitor the areas for signs of infection to include fever, chills, worsening redness or foul-smelling drainage.  -Stay away from the elderly, women that are pregnant or anyone who is immunocompromised. -Follow up with your PCP in the next 3-5 days if no improvement.   Shingles, which is also known as herpes zoster, is an infection that causes a painful skin rash and fluid-filled blisters. It is caused by a virus. Shingles only develops in people who:  Have had chickenpox.  Have been given a medicine to protect against chickenpox (have been vaccinated). Shingles is rare in this group. What are the causes? Shingles is caused by varicella-zoster virus (VZV). This is the same virus that causes chickenpox. After a person is exposed to VZV, the virus stays in the body in an inactive (dormant) state. Shingles develops if the virus is reactivated. This can happen many years after the first (initial) exposure to VZV. It is not known what causes this virus to be reactivated. What increases the risk? People who have had chickenpox or received the chickenpox vaccine are at risk for shingles. Shingles infection is more common in people who:  Are older than age 45.  Have a weakened disease-fighting system (immune system), such as people with: ? HIV. ? AIDS. ? Cancer.  Are taking medicines that weaken the immune system, such as transplant medicines.  Are experiencing a lot of stress. What are the signs or symptoms? Early symptoms of this condition include itching, tingling, and pain in an area on your skin. Pain may be  described as burning, stabbing, or throbbing. A few days or weeks after early symptoms start, a painful red rash appears. The rash is usually on one side of the body and has a band-like or belt-like pattern. The rash eventually turns into fluid-filled blisters that break open, change into scabs, and dry up in about 2-3 weeks. At any time during the infection, you may also develop:  A fever.  Chills.  A headache.  An upset stomach. How is this diagnosed? This condition is diagnosed with a skin exam. Skin or fluid samples may be taken from the blisters before a diagnosis is made. These samples are examined under a microscope or sent to a lab for testing. How is this treated? The rash may last for several weeks. There is not a specific cure for this condition. Your health care provider will probably prescribe medicines to help you manage pain, recover more quickly, and avoid long-term problems. Medicines may include:  Antiviral drugs.  Anti-inflammatory drugs.  Pain medicines.  Anti-itching medicines (antihistamines). If the area involved is on your face, you may be referred to a specialist, such as an eye doctor (ophthalmologist) or an ear, nose, and throat (ENT) doctor (otolaryngologist) to help you avoid eye problems, chronic pain, or disability. Follow these instructions at home: Medicines  Take over-the-counter and prescription medicines only as told by your health care provider.  Apply an anti-itch cream or numbing cream to the affected area as told by your health care provider. Relieving itching and discomfort   Apply cold, wet cloths (cold compresses)  to the area of the rash or blisters as told by your health care provider.  Cool baths can be soothing. Try adding baking soda or dry oatmeal to the water to reduce itching. Do not bathe in hot water. Blister and rash care  Keep your rash covered with a loose bandage (dressing). Wear loose-fitting clothing to help ease the pain  of material rubbing against the rash.  Keep your rash and blisters clean by washing the area with mild soap and cool water as told by your health care provider.  Check your rash every day for signs of infection. Check for: ? More redness, swelling, or pain. ? Fluid or blood. ? Warmth. ? Pus or a bad smell.  Do not scratch your rash or pick at your blisters. To help avoid scratching: ? Keep your fingernails clean and cut short. ? Wear gloves or mittens while you sleep, if scratching is a problem. General instructions  Rest as told by your health care provider.  Keep all follow-up visits as told by your health care provider. This is important.  Wash your hands often with soap and water. If soap and water are not available, use hand sanitizer. Doing this lowers your chance of getting a bacterial skin infection.  Before your blisters change into scabs, your shingles infection can cause chickenpox in people who have never had it or have never been vaccinated against it. To prevent this from happening, avoid contact with other people, especially: ? Babies. ? Pregnant women. ? Children who have eczema. ? Elderly people who have transplants. ? People who have chronic illnesses, such as cancer or AIDS. Contact a health care provider if:  Your pain is not relieved with prescribed medicines.  Your pain does not get better after the rash heals.  You have signs of infection in the rash area, such as: ? More redness, swelling, or pain around the rash. ? Fluid or blood coming from the rash. ? The rash area feeling warm to the touch. ? Pus or a bad smell coming from the rash. Get help right away if:  The rash is on your face or nose.  You have facial pain, pain around your eye area, or loss of feeling on one side of your face.  You have difficulty seeing.  You have ear pain or have ringing in your ear.  You have a loss of taste.  Your condition gets worse. Summary  Shingles,  which is also known as herpes zoster, is an infection that causes a painful skin rash and fluid-filled blisters.  This condition is diagnosed with a skin exam. Skin or fluid samples may be taken from the blisters and examined before the diagnosis is made.  Keep your rash covered with a loose bandage (dressing). Wear loose-fitting clothing to help ease the pain of material rubbing against the rash.  Before your blisters change into scabs, your shingles infection can cause chickenpox in people who have never had it or have never been vaccinated against it. This information is not intended to replace advice given to you by your health care provider. Make sure you discuss any questions you have with your health care provider. Document Released: 04/07/2005 Document Revised: 12/10/2016 Document Reviewed: 12/10/2016 Elsevier Interactive Patient Education  2019 [employer]  Neuropathic Pain Neuropathic pain is pain caused by damage to the nerves that are responsible for certain sensations in your body (sensory nerves). The pain can be caused by:  Damage to the sensory nerves that send  signals to your spinal cord and brain (peripheral nervous system).  Damage to the sensory nerves in your brain or spinal cord (central nervous system). Neuropathic pain can make you more sensitive to pain. Even a minor sensation can feel very painful. This is usually a long-term condition that can be difficult to treat. The type of pain differs from person to person. It may:  Start suddenly (acute), or it may develop slowly and last for a long time (chronic).  Come and go as damaged nerves heal, or it may stay at the same level for years.  Cause emotional distress, loss of sleep, and a lower quality of life. What are the causes? The most common cause of this condition is diabetes. Many other diseases and conditions can also cause neuropathic pain. Causes of neuropathic pain can be classified as:  Toxic. This is  caused by medicines and chemicals. The most common cause of toxic neuropathic pain is damage from cancer treatments (chemotherapy).  Metabolic. This can be caused by: ? Diabetes. This is the most common disease that damages the nerves. ? Lack of vitamin B from long-term alcohol abuse.  Traumatic. Any injury that cuts, crushes, or stretches a nerve can cause damage and pain. A common example is feeling pain after losing an arm or leg (phantom limb pain).  Compression-related. If a sensory nerve gets trapped or compressed for a long period of time, the blood supply to the nerve can be cut off.  Vascular. Many blood vessel diseases can cause neuropathic pain by decreasing blood supply and oxygen to nerves.  Autoimmune. This type of pain results from diseases in which the body's defense system (immune system) mistakenly attacks sensory nerves. Examples of autoimmune diseases that can cause neuropathic pain include lupus and multiple sclerosis.  Infectious. Many types of viral infections can damage sensory nerves and cause pain. Shingles infection is a common cause of this type of pain.  Inherited. Neuropathic pain can be a symptom of many diseases that are passed down through families (genetic). What increases the risk? You are more likely to develop this condition if:  You have diabetes.  You smoke.  You drink too much alcohol.  You are taking certain medicines, including medicines that kill cancer cells (chemotherapy) or that treat immune system disorders. What are the signs or symptoms? The main symptom is pain. Neuropathic pain is often described as:  Burning.  Shock-like.  Stinging.  Hot or cold.  Itching. How is this diagnosed? No single test can diagnose neuropathic pain. It is diagnosed based on:  Physical exam and your symptoms. Your health care provider will ask you about your pain. You may be asked to use a pain scale to describe how bad your pain is.  Tests. These  may be done to see if you have a high sensitivity to pain and to help find the cause and location of any sensory nerve damage. They include: ? Nerve conduction studies to test how well nerve signals travel through your sensory nerves (electrodiagnostic testing). ? Stimulating your sensory nerves through electrodes on your skin and measuring the response in your spinal cord and brain (somatosensory evoked potential).  Imaging studies, such as: ? X-rays. ? CT scan. ? MRI. How is this treated? Treatment for neuropathic pain may change over time. You may need to try different treatment options or a combination of treatments. Some options include:  Treating the underlying cause of the neuropathy, such as diabetes, kidney disease, or vitamin deficiencies.  Stopping  medicines that can cause neuropathy, such as chemotherapy.  Medicine to relieve pain. Medicines may include: ? Prescription or over-the-counter pain medicine. ? Anti-seizure medicine. ? Antidepressant medicines. ? Pain-relieving patches that are applied to painful areas of skin. ? A medicine to numb the area (local anesthetic), which can be injected as a nerve block.  Transcutaneous nerve stimulation. This uses electrical currents to block painful nerve signals. The treatment is painless.  Alternative treatments, such as: ? Acupuncture. ? Meditation. ? Massage. ? Physical therapy. ? Pain management programs. ? Counseling. Follow these instructions at home: Medicines   Take over-the-counter and prescription medicines only as told by your health care provider.  Do not drive or use heavy machinery while taking prescription pain medicine.  If you are taking prescription pain medicine, take actions to prevent or treat constipation. Your health care provider may recommend that you: ? Drink enough fluid to keep your urine pale yellow. ? Eat foods that are high in fiber, such as fresh fruits and vegetables, whole grains, and  beans. ? Limit foods that are high in fat and processed sugars, such as fried or sweet foods. ? Take an over-the-counter or prescription medicine for constipation. Lifestyle   Have a good support system at home.  Consider joining a chronic pain support group.  Do not use any products that contain nicotine or tobacco, such as cigarettes and e-cigarettes. If you need help quitting, ask your health care provider.  Do not drink alcohol. General instructions  Learn as much as you can about your condition.  Work closely with all your health care providers to find the treatment plan that works best for you.  Ask your health care provider what activities are safe for you.  Keep all follow-up visits as told by your health care provider. This is important. Contact a health care provider if:  Your pain treatments are not working.  You are having side effects from your medicines.  You are struggling with tiredness (fatigue), mood changes, depression, or anxiety. Summary  Neuropathic pain is pain caused by damage to the nerves that are responsible for certain sensations in your body (sensory nerves).  Neuropathic pain may come and go as damaged nerves heal, or it may stay at the same level for years.  Neuropathic pain is usually a long-term condition that can be difficult to treat. Consider joining a chronic pain support group. This information is not intended to replace advice given to you by your health care provider. Make sure you discuss any questions you have with your health care provider. Document Released: 01/03/2004 Document Revised: 04/24/2017 Document Reviewed: 04/24/2017 [employer] Interactive Patient Education  2019 [employer].

## 2018-07-04 NOTE — Progress Notes (Signed)
Subjective:    Patient ID: Lisa Horne, female    DOB: Jul 20, 1975, 43 y.o.   MRN: [id number]  The patient is a 43 year old female who presents today with complaints of right scalp pain.  When I walked in the room to asked the patient how she was doing, she responded "my scalp is on fire".  The patient states her right ear began hurting about 2 days ago, with her scalp beginning to experience tingling and burning 1 day ago.  The patient states, "it feels lumpy".  The patient denies fever, fatigue, malaise, chills, cough, runny nose, abdominal pain, nausea, vomiting.  The patient has had a flu shot this season.  The patient has not taken anything for her symptoms.  Rash  This is a new problem. The current episode started in the past 7 days. The problem has been rapidly worsening since onset. The affected locations include the scalp (Right scalp). The rash is characterized by burning and itchiness (Tingling). She was exposed to nothing. Pertinent negatives include no congestion. (Sinus pressure) Past treatments include nothing. The treatment provided no relief. Her past medical history is significant for varicella. There is no history of allergies or asthma.   Past Medical History:  Diagnosis Date  . GERD (gastroesophageal reflux disease)   . Morbid obesity (HCC)   . PCOS (polycystic ovarian syndrome)   . PONV (postoperative nausea and vomiting)   . Thyroid disease    hypothyroidism    Review of Systems  Constitutional: Negative.   HENT: Positive for ear pain ( Right) and sinus pressure. Negative for congestion and ear discharge.   Eyes: Negative.   Respiratory: Negative.   Cardiovascular: Negative.   Gastrointestinal: Negative.   Skin: Positive for rash.  Allergic/Immunologic: Negative.   Neurological: Negative.       Objective: Blood pressure 120/84, pulse 77, temperature 98.4 F (36.9 C), resp. rate 16, weight (!) 308 lb (139.7 kg), SpO2 98 %.   Physical Exam Vitals signs  reviewed.  Constitutional:      General: She is not in acute distress. HENT:     Head: Normocephalic.     Right Ear: Tympanic membrane, ear canal and external ear normal.     Left Ear: Tympanic membrane, ear canal and external ear normal.     Nose: Congestion (mild) and rhinorrhea (clear drainage) present.     Mouth/Throat:     Mouth: Mucous membranes are moist.     Pharynx: No oropharyngeal exudate or posterior oropharyngeal erythema.  Eyes:     Conjunctiva/sclera: Conjunctivae normal.     Pupils: Pupils are equal, round, and reactive to light.  Neck:     Musculoskeletal: Normal range of motion and neck supple.  Cardiovascular:     Rate and Rhythm: Normal rate and regular rhythm.     Pulses: Normal pulses.     Heart sounds: Normal heart sounds.  Pulmonary:     Effort: Pulmonary effort is normal. No respiratory distress.     Breath sounds: Normal breath sounds. No stridor. No wheezing, rhonchi or rales.  Abdominal:     General: Bowel sounds are normal.     Palpations: Abdomen is soft.     Tenderness: There is no abdominal tenderness.  Lymphadenopathy:     Cervical: No cervical adenopathy.  Skin:    General: Skin is warm and dry.     Capillary Refill: Capillary refill takes less than 2 seconds.          Comments: Tenderness  to the right posterior scalp. Able to palpate 4-5 nodules measuring 2-4 cm in diameter. Scalp has mild erythema to these areas. Tenderness is unilateral in the region of dermatomes in the dorsal rami. No blistering or vesicles present at this time. No involvement of the right eye at this time.  Neurological:     General: No focal deficit present.     Mental Status: She is alert and oriented to person, place, and time.     Cranial Nerves: No cranial nerve deficit.  Psychiatric:        Mood and Affect: Mood normal.        Behavior: Behavior normal.       Assessment & Plan:   Exam findings, diagnosis etiology and medication use and indications reviewed  with patient. Follow- Up and discharge instructions provided. No emergent/urgent issues found on exam. Based on the patient's clinical presentation, patient's findings are consistent with that of cranial nerve shingles. 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  I am going to prescribe famciclovir for the patient for the next 7 days.  Patient was instructed to continue to monitor her scalp for blistering or vesicles that may develop and begin oozing.  Informed patient that she should remain home if so.  The patient was also given a prescription for Prednisone to help with the itching.  The patient was also instructed to perform symptomatic treatment at home to include Ibuprofen or tylenol for pain, cool cloths to the scalp for comfort and avoid scratching or rubbing her scalp.  The patient is well-appearing, is in no acute distress and vitals are stable at this time.  The patient's Patient education was provided. Patient verbalized understanding of information provided and agrees with plan of care (POC), all questions answered. The patient is advised to call or return to clinic if condition does not see an improvement in symptoms, or to seek the care of the closest emergency department if condition worsens with the above plan.   1. Herpes zoster without complication  - famciclovir (FAMVIR) 500 MG tablet; Take 1 tablet (500 mg total) by mouth 3 (three) times daily for 7 days.  Dispense: 21 tablet; Refill: 0 - predniSONE (DELTASONE) 20 MG tablet; Take 1 tablet (20 mg total) by mouth daily with breakfast.  Dispense: 5 tablet; Refill: 0 -Take medication as prescribed. -Ibuprofen or Tylenol for pain, fever or general discomfort. -Apply cold, wet cloths (cold compresses) to the area of the rash or blisters as told by your health care provider. -If blisters form and they begin to ooze, you will need  to remain home from work.  Keep your head covered if this occurs. -Do not scratch or rub your head. -Continue to monitor the areas for signs of infection to include fever, chills, worsening redness or foul-smelling drainage.  -Stay away from the elderly, women that are pregnant or anyone who is immunocompromised. -Follow up with your PCP in the next 3-5 days if no improvement.

## 2018-08-27 MED FILL — OMEPRAZOLE 40 MG CPDR: 40 | 90 days supply | Qty: 180 | Fill #1

## 2018-08-27 MED FILL — LEVOTHYROXINE 200 MCG TAB: 200 | 90 days supply | Qty: 90 | Fill #1

## 2019-02-14 ENCOUNTER — Encounter: Payer: 59 | Admitting: Family Medicine

## 2019-02-25 ENCOUNTER — Encounter: Payer: Self-pay | Admitting: Family Medicine

## 2019-02-25 ENCOUNTER — Ambulatory Visit (INDEPENDENT_AMBULATORY_CARE_PROVIDER_SITE_OTHER): Payer: 59 | Admitting: Family Medicine

## 2019-02-25 ENCOUNTER — Other Ambulatory Visit: Payer: Self-pay

## 2019-02-25 VITALS — BP 132/80 | HR 80 | Temp 98.6°F | Ht 68.0 in | Wt 311.0 lb

## 2019-02-25 DIAGNOSIS — R739 Hyperglycemia, unspecified: Secondary | ICD-10-CM | POA: Diagnosis not present

## 2019-02-25 DIAGNOSIS — Z0000 Encounter for general adult medical examination without abnormal findings: Secondary | ICD-10-CM

## 2019-02-25 DIAGNOSIS — E039 Hypothyroidism, unspecified: Secondary | ICD-10-CM | POA: Diagnosis not present

## 2019-02-25 DIAGNOSIS — J0140 Acute pansinusitis, unspecified: Secondary | ICD-10-CM

## 2019-02-25 LAB — T3, FREE: T3, Free: 3.6 pg/mL (ref 2.3–4.2)

## 2019-02-25 LAB — LIPID PANEL
Cholesterol: 232 mg/dL — ABNORMAL HIGH (ref 0–200)
HDL: 43.1 mg/dL (ref >39.00)
LDL Cholesterol: 159 mg/dL — ABNORMAL HIGH (ref 0–99)
NonHDL: 188.65
Total CHOL/HDL Ratio: 5
Triglycerides: 150 mg/dL — ABNORMAL HIGH (ref 0.0–149.0)
VLDL: 30 mg/dL (ref 0.0–40.0)

## 2019-02-25 LAB — HEPATIC FUNCTION PANEL
ALT: 31 U/L (ref 0–35)
AST: 23 U/L (ref 0–37)
Albumin: 3.9 g/dL (ref 3.5–5.2)
Alkaline Phosphatase: 71 U/L (ref 39–117)
Bilirubin, Direct: 0.1 mg/dL (ref 0.0–0.3)
Total Bilirubin: 0.6 mg/dL (ref 0.2–1.2)
Total Protein: 6.8 g/dL (ref 6.0–8.3)

## 2019-02-25 LAB — CBC WITH DIFFERENTIAL/PLATELET
Basophils Absolute: 0.1 10*3/uL (ref 0.0–0.1)
Basophils Relative: 0.7 % (ref 0.0–3.0)
Eosinophils Absolute: 0.2 10*3/uL (ref 0.0–0.7)
Eosinophils Relative: 2.1 % (ref 0.0–5.0)
HCT: 38.7 % (ref 36.0–46.0)
Hemoglobin: 12.7 g/dL (ref 12.0–15.0)
Lymphocytes Relative: 19.3 % (ref 12.0–46.0)
Lymphs Abs: 1.7 10*3/uL (ref 0.7–4.0)
MCHC: 32.9 g/dL (ref 30.0–36.0)
MCV: 83.5 fl (ref 78.0–100.0)
Monocytes Absolute: 0.9 10*3/uL (ref 0.1–1.0)
Monocytes Relative: 9.8 % (ref 3.0–12.0)
Neutro Abs: 6 10*3/uL (ref 1.4–7.7)
Neutrophils Relative %: 68.1 % (ref 43.0–77.0)
Platelets: 279 10*3/uL (ref 150.0–400.0)
RBC: 4.63 Mil/uL (ref 3.87–5.11)
RDW: 12.7 % (ref 11.5–15.5)
WBC: 8.8 10*3/uL (ref 4.0–10.5)

## 2019-02-25 LAB — BASIC METABOLIC PANEL
BUN: 13 mg/dL (ref 6–23)
CO2: 28 mEq/L (ref 19–32)
Calcium: 9 mg/dL (ref 8.4–10.5)
Chloride: 102 mEq/L (ref 96–112)
Creatinine, Ser: 0.82 mg/dL (ref 0.40–1.20)
GFR: 75.11 mL/min (ref >60.00)
Glucose, Bld: 120 mg/dL — ABNORMAL HIGH (ref 70–99)
Potassium: 4.5 mEq/L (ref 3.5–5.1)
Sodium: 137 mEq/L (ref 135–145)

## 2019-02-25 LAB — T4, FREE: Free T4: 0.9 ng/dL (ref 0.60–1.60)

## 2019-02-25 LAB — TSH: TSH: 6.22 u[IU]/mL — ABNORMAL HIGH (ref 0.35–4.50)

## 2019-02-25 LAB — HEMOGLOBIN A1C: Hgb A1c MFr Bld: 6.4 % (ref 4.6–6.5)

## 2019-02-25 MED ORDER — METFORMIN HCL 500 MG PO TABS: 500.0000 mg | ORAL_TABLET | Freq: Two times a day (BID) | ORAL | 3 refills | Status: DC

## 2019-02-25 MED ORDER — OMEPRAZOLE 40 MG PO CPDR: 40.0000 mg | DELAYED_RELEASE_CAPSULE | Freq: Two times a day (BID) | ORAL | 3 refills | Status: DC

## 2019-02-25 MED ORDER — CYCLOBENZAPRINE HCL 10 MG PO TABS: 10.0000 mg | ORAL_TABLET | Freq: Three times a day (TID) | ORAL | 3 refills | Status: DC | PRN

## 2019-02-25 MED ORDER — LEVOTHYROXINE SODIUM 50 MCG PO TABS: 50.0000 ug | ORAL_TABLET | Freq: Every day | ORAL | 3 refills | Status: DC

## 2019-02-25 MED ORDER — LEVOTHYROXINE SODIUM 200 MCG PO TABS: ORAL_TABLET | ORAL | 3 refills | Status: DC

## 2019-02-25 MED ORDER — BARIATRIC MULTIVITAMINS/IRON PO CAPS: 1.0000 | ORAL_CAPSULE | Freq: Every day | ORAL | 3 refills | Status: AC

## 2019-02-25 MED FILL — CYCLOBENZAPRINE HCL 10 MG T: 10 | 90 days supply | Qty: 270 | Fill #0

## 2019-02-25 MED FILL — LEVOTHYROXINE 200 MCG TAB: 200 | 90 days supply | Qty: 90 | Fill #0

## 2019-02-25 MED FILL — LEVOTHYROXINE 50 MCG TABLET: 50 | 90 days supply | Qty: 90 | Fill #0

## 2019-02-25 MED FILL — metFORMIN HCL 500 MG TABS: 500 | 90 days supply | Qty: 180 | Fill #0

## 2019-02-25 MED FILL — OMEPRAZOLE 40 MG CPDR: 40 | 90 days supply | Qty: 180 | Fill #0

## 2019-02-25 NOTE — Progress Notes (Signed)
   Subjective:    Patient ID: Lisa Horne, female    DOB: Nov 10, 1975, 43 y.o.   MRN: [id number]  HPI Here for a well exam. She feels fine.    Review of Systems  Constitutional: Negative.   HENT: Negative.   Eyes: Negative.   Respiratory: Negative.   Cardiovascular: Negative.   Gastrointestinal: Negative.   Genitourinary: Negative for decreased urine volume, difficulty urinating, dyspareunia, dysuria, enuresis, flank pain, frequency, hematuria, pelvic pain and urgency.  Musculoskeletal: Negative.   Skin: Negative.   Neurological: Negative.   Psychiatric/Behavioral: Negative.        Objective:   Physical Exam Constitutional:      General: She is not in acute distress.    Appearance: She is well-developed. She is obese.  HENT:     Head: Normocephalic and atraumatic.     Right Ear: External ear normal.     Left Ear: External ear normal.     Nose: Nose normal.     Mouth/Throat:     Pharynx: No oropharyngeal exudate.  Eyes:     General: No scleral icterus.    Conjunctiva/sclera: Conjunctivae normal.     Pupils: Pupils are equal, round, and reactive to light.  Neck:     Musculoskeletal: Normal range of motion and neck supple.     Thyroid: No thyromegaly.     Vascular: No JVD.  Cardiovascular:     Rate and Rhythm: Normal rate and regular rhythm.     Heart sounds: Normal heart sounds. No murmur. No friction rub. No gallop.   Pulmonary:     Effort: Pulmonary effort is normal. No respiratory distress.     Breath sounds: Normal breath sounds. No wheezing or rales.  Chest:     Chest wall: No tenderness.  Abdominal:     General: Bowel sounds are normal. There is no distension.     Palpations: Abdomen is soft. There is no mass.     Tenderness: There is no abdominal tenderness. There is no guarding or rebound.  Musculoskeletal: Normal range of motion.        General: No tenderness.  Lymphadenopathy:     Cervical: No cervical adenopathy.  Skin:    General: Skin is warm  and dry.     Findings: No erythema or rash.  Neurological:     Mental Status: She is alert and oriented to person, place, and time.     Cranial Nerves: No cranial nerve deficit.     Motor: No abnormal muscle tone.     Coordination: Coordination normal.     Deep Tendon Reflexes: Reflexes are normal and symmetric. Reflexes normal.  Psychiatric:        Behavior: Behavior normal.        Thought Content: Thought content normal.        Judgment: Judgment normal.           Assessment & Plan:  Well exam. We discussed diet and exercise. Get fasting labs. I reminded her to get a mammogram.  Alysia Penna, MD

## 2019-02-25 NOTE — Addendum Note (Signed)
Addended by: Alysia Penna A on: 02/25/2019 12:32 PM   Modules accepted: Orders

## 2019-02-25 NOTE — Patient Instructions (Signed)
Health Maintenance Due  Topic Date Due  . HIV Screening  05/02/1988  . PAP SMEAR-Modifier  05/02/1994    No flowsheet data found.

## 2019-06-12 IMAGING — RF DG FLUORO GUIDE NDL PLC/BX
2 series · 2 of 2 positions shown · IV contrast (multihance)
Comparison: none

CLINICAL DATA: Right shoulder injury, pain

EXAM:
RIGHT SHOULDER INJECTION UNDER FLUOROSCOPY
TECHNIQUE: An appropriate skin entrance site was determined. The site was
marked, prepped with Betadine, draped in the usual sterile fashion,
and infiltrated locally with buffered Lidocaine. 22 gauge spinal
needle was advanced to the superomedial margin of the humeral head
under intermittent fluoroscopy. 1 ml of Lidocaine injected easily. A
mixture of 0.1 ml Multihance in 20 ml of dilute Omnipaque was then
used to opacify the right shoulder capsule. No immediate
complication.
FLUOROSCOPY TIME:  Fluoroscopy Time:  48 seconds
Radiation Exposure Index (if provided by the fluoroscopic device):
Number of Acquired Spot Images: 0

[Series 1: cp_standard · 0.18mm/px · 1 of 1 slices shown (1 of 2)]
[im 1/1]
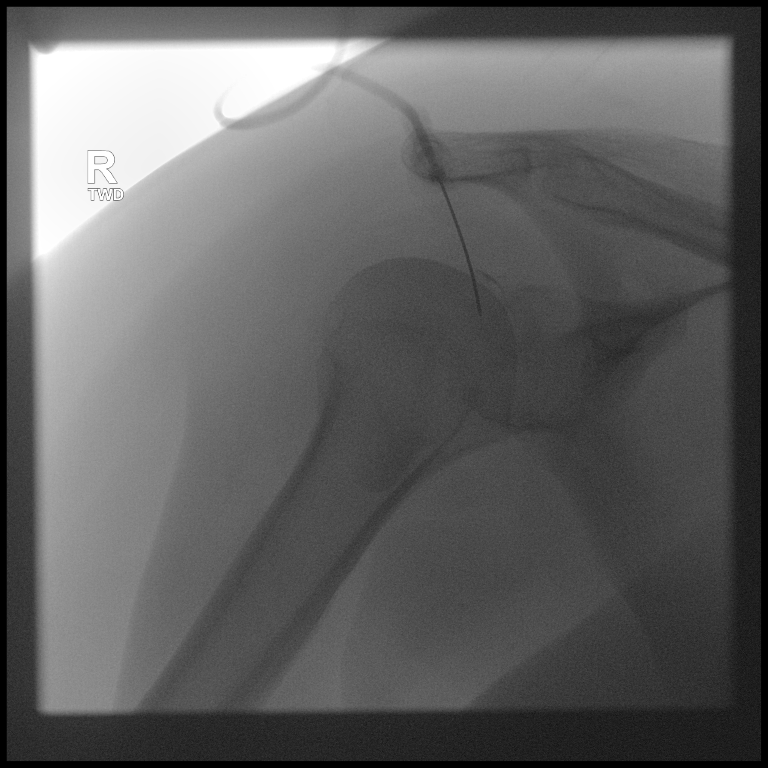

[Series 3: cp_standard · 0.18mm/px · 1 of 1 slices shown (2 of 2)]
[im 1/1]
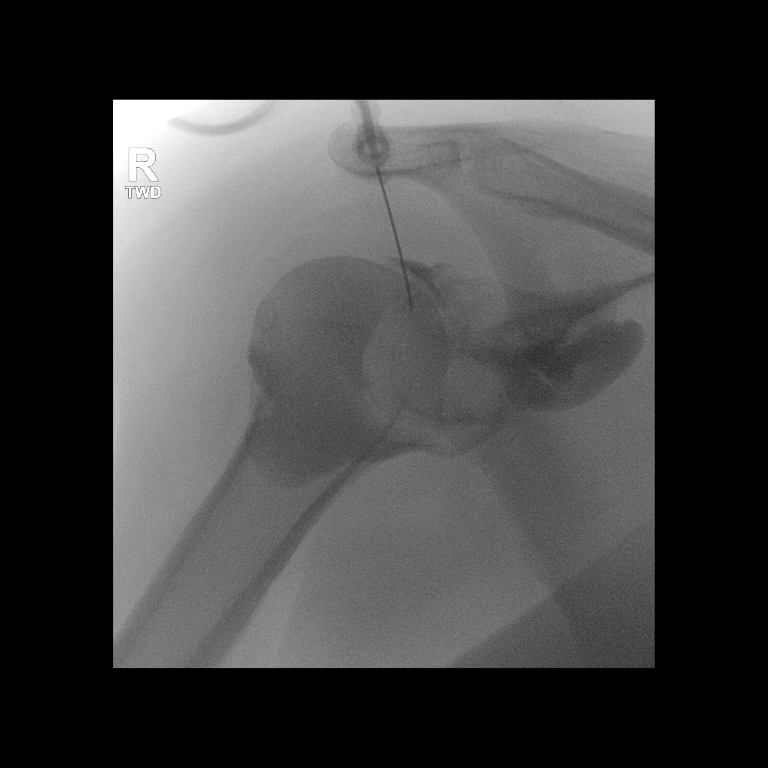

[2 of 2 positions shown; findings below may reference images not displayed]

FINDINGS: Shoulder injection for MRI as above. The patient will be transferred
to MRI for imaging.
IMPRESSION: Technically successful right shoulder injection for MRI.

## 2019-06-15 ENCOUNTER — Encounter: Payer: Self-pay | Admitting: Family Medicine

## 2019-08-08 ENCOUNTER — Ambulatory Visit (INDEPENDENT_AMBULATORY_CARE_PROVIDER_SITE_OTHER): Payer: 59 | Admitting: Family Medicine

## 2019-08-08 ENCOUNTER — Other Ambulatory Visit: Payer: Self-pay

## 2019-08-08 ENCOUNTER — Encounter: Payer: Self-pay | Admitting: Family Medicine

## 2019-08-08 VITALS — BP 130/64 | HR 81 | Temp 97.7°F | Wt 207.8 lb

## 2019-08-08 DIAGNOSIS — R739 Hyperglycemia, unspecified: Secondary | ICD-10-CM | POA: Diagnosis not present

## 2019-08-08 DIAGNOSIS — E039 Hypothyroidism, unspecified: Secondary | ICD-10-CM | POA: Diagnosis not present

## 2019-08-08 LAB — TSH: TSH: 3.56 u[IU]/mL (ref 0.35–4.50)

## 2019-08-08 LAB — T4, FREE: Free T4: 0.89 ng/dL (ref 0.60–1.60)

## 2019-08-08 LAB — T3, FREE: T3, Free: 3 pg/mL (ref 2.3–4.2)

## 2019-08-08 LAB — HEMOGLOBIN A1C: Hgb A1c MFr Bld: 6.2 % (ref 4.6–6.5)

## 2019-08-08 MED ORDER — METFORMIN HCL 500 MG PO TABS: ORAL_TABLET | ORAL | 3 refills | Status: DC

## 2019-08-08 NOTE — Progress Notes (Signed)
   Subjective:    Patient ID: Lisa Horne, female    DOB: January 23, 1976, 44 y.o.   MRN: [id number]  HPI Here to follow up. Last November we increased her Synthroid dose, but she still says she feels tired a lot. Her weight is stable. She is trying to lose some weight. Her am fasting glucoses have been running high, around 140-150.    Review of Systems  Constitutional: Positive for fatigue.  Respiratory: Negative.   Cardiovascular: Negative.   Endocrine: Negative.        Objective:   Physical Exam Constitutional:      Appearance: Normal appearance.  Cardiovascular:     Rate and Rhythm: Normal rate and regular rhythm.     Pulses: Normal pulses.     Heart sounds: Normal heart sounds.  Pulmonary:     Effort: Pulmonary effort is normal.     Breath sounds: Normal breath sounds.  Neurological:     Mental Status: She is alert.           Assessment & Plan:  Hypothyroidism and hyperglycemia. Check a thyroid panel and an A1c today.  Gershon Crane, MD

## 2019-09-08 ENCOUNTER — Other Ambulatory Visit: Payer: Self-pay

## 2019-09-09 ENCOUNTER — Ambulatory Visit (INDEPENDENT_AMBULATORY_CARE_PROVIDER_SITE_OTHER): Payer: 59 | Admitting: Family Medicine

## 2019-09-09 ENCOUNTER — Encounter: Payer: Self-pay | Admitting: Family Medicine

## 2019-09-09 VITALS — BP 130/72 | HR 75 | Temp 98.3°F | Wt 308.6 lb

## 2019-09-09 DIAGNOSIS — M5442 Lumbago with sciatica, left side: Secondary | ICD-10-CM | POA: Diagnosis not present

## 2019-09-09 DIAGNOSIS — G8929 Other chronic pain: Secondary | ICD-10-CM

## 2019-09-09 MED ORDER — PREDNISONE 10 MG PO TABS: ORAL_TABLET | ORAL | 0 refills | Status: DC

## 2019-09-09 MED FILL — predniSONE 10 MG TABS: 10 | 15 days supply | Qty: 50 | Fill #0

## 2019-09-09 MED FILL — CYCLOBENZAPRINE HCL 10 MG T: 10 | 90 days supply | Qty: 270 | Fill #0

## 2019-09-09 NOTE — Progress Notes (Signed)
   Subjective:    Patient ID: Lisa Horne, female    DOB: [birth year], 44 y.o.   MRN: [id number]  HPI Here for 8 weeks of pain in the left lower back than radiates down the left leg. No hx of trauma. 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 She has some weakness in the left leg but no numbness. Using 800 mg of Ibuprofen, Flexeril, heat, and icy Hot with little relief.    Review of Systems  Constitutional: Negative.   Respiratory: Negative.   Cardiovascular: Negative.   Musculoskeletal: Positive for back pain.       Objective:   Physical Exam Constitutional:      Comments: In some pain   Cardiovascular:     Rate and Rhythm: Normal rate and regular rhythm.     Pulses: Normal pulses.     Heart sounds: Normal heart sounds.  Pulmonary:     Effort: Pulmonary effort is normal.     Breath sounds: Normal breath sounds.  Musculoskeletal:     Comments: Tender over the lower spine and the left sciatic notch   Neurological:     Mental Status: She is alert.           Assessment & Plan:  Lumbar pain. Given a 15 day taper or Prednisone to begin at 50 mg daily. Recheck as needed.  Gershon Crane, MD

## 2019-10-12 DIAGNOSIS — Z903 Acquired absence of stomach [part of]: Secondary | ICD-10-CM | POA: Diagnosis not present

## 2019-10-17 MED FILL — PHENTERMINE 37.5 MG CAPSULE: 37.5 | 30 days supply | Qty: 30 | Fill #0

## 2019-11-15 MED FILL — PHENTERMINE 37.5 MG CAPSULE: 37.5 | 30 days supply | Qty: 30 | Fill #1

## 2019-11-24 DIAGNOSIS — Z903 Acquired absence of stomach [part of]: Secondary | ICD-10-CM | POA: Diagnosis not present

## 2019-11-24 MED FILL — PHENTERMINE 37.5 MG CAPSULE: 37.5 | 30 days supply | Qty: 30 | Fill #1

## 2019-11-24 MED FILL — LEVOTHYROXINE SODIUM 200 MC: 200 | 90 days supply | Qty: 90 | Fill #2

## 2019-12-06 ENCOUNTER — Encounter: Payer: Self-pay | Admitting: Family Medicine

## 2019-12-06 ENCOUNTER — Other Ambulatory Visit: Payer: Self-pay | Admitting: Family Medicine

## 2019-12-06 MED ORDER — VALACYCLOVIR HCL 500 MG PO TABS: 500.0000 mg | ORAL_TABLET | Freq: Two times a day (BID) | ORAL | 5 refills | Status: DC

## 2019-12-06 MED FILL — VALACYCLOVIR HCL 500 MG TAB: 500 | 30 days supply | Qty: 60 | Fill #0

## 2019-12-06 NOTE — Telephone Encounter (Signed)
Call in Valtrex 500 mg to take BID as needed for fever blisters, #60 with 5 rf

## 2019-12-06 NOTE — Telephone Encounter (Signed)
Please advise. Rx is not on the current med list 

## 2019-12-28 ENCOUNTER — Other Ambulatory Visit: Payer: Self-pay

## 2019-12-28 ENCOUNTER — Ambulatory Visit: Payer: 59 | Admitting: Family Medicine

## 2019-12-28 ENCOUNTER — Encounter: Payer: Self-pay | Admitting: Family Medicine

## 2019-12-28 VITALS — BP 120/76 | HR 88 | Temp 98.8°F | Wt 292.0 lb

## 2019-12-28 DIAGNOSIS — D619 Aplastic anemia, unspecified: Secondary | ICD-10-CM | POA: Diagnosis not present

## 2019-12-28 DIAGNOSIS — E039 Hypothyroidism, unspecified: Secondary | ICD-10-CM | POA: Diagnosis not present

## 2019-12-28 DIAGNOSIS — R519 Headache, unspecified: Secondary | ICD-10-CM

## 2019-12-28 MED ORDER — SUMATRIPTAN SUCCINATE 100 MG PO TABS: 100.0000 mg | ORAL_TABLET | ORAL | 5 refills | Status: DC | PRN

## 2019-12-28 MED FILL — SUMATRIPTAN SUCCINATE 100 M: 100 | 30 days supply | Qty: 9 | Fill #0

## 2019-12-28 NOTE — Progress Notes (Signed)
   Subjective:    Patient ID: Lisa Horne, female    DOB: [birth year], 44 y.o.   MRN: [id number]  HPI Here for 2 weeks of symptoms she has never felt before. She has had an intermittent left sided headache that is centered just above and behind the left eye. This is throbbing in nature but not particularly severe. This is accompanied by light sensitivity, by dizziness, and by blurriness or floaters in the left eye. No double vision or loss of vision. This does not cause nausea. No hx of migraines. She has been taking Ibuprofen which makes it tolerable but it does not go away. No hx of head trauma. She has been able to drive and to go to work. The headache is not present as we speak.    Review of Systems  Constitutional: Negative.   Eyes: Positive for photophobia and visual disturbance.  Respiratory: Negative.   Neurological: Positive for dizziness and headaches.       Objective:   Physical Exam Constitutional:      General: She is not in acute distress.    Appearance: Normal appearance. She is well-developed.  Eyes:     Extraocular Movements: Extraocular movements intact.     Conjunctiva/sclera: Conjunctivae normal.     Pupils: Pupils are equal, round, and reactive to light.  Neck:     Vascular: No carotid bruit.  Cardiovascular:     Rate and Rhythm: Normal rate and regular rhythm.     Pulses: Normal pulses.     Heart sounds: Normal heart sounds.  Pulmonary:     Effort: Pulmonary effort is normal.     Breath sounds: Normal breath sounds.  Musculoskeletal:     Cervical back: No rigidity.  Lymphadenopathy:     Cervical: No cervical adenopathy.  Neurological:     General: No focal deficit present.     Mental Status: She is alert and oriented to person, place, and time.     Cranial Nerves: No cranial nerve deficit.     Coordination: Coordination normal.     Gait: Gait normal.           Assessment & Plan:  These episodes sound like migraines. She will try  Sumatriptan as needed. Get labs today including a thyroid panel. Set up an head CT soon.  Gershon Crane, MD

## 2019-12-29 ENCOUNTER — Encounter: Payer: Self-pay | Admitting: Family Medicine

## 2019-12-29 DIAGNOSIS — R739 Hyperglycemia, unspecified: Secondary | ICD-10-CM

## 2019-12-29 LAB — CBC WITH DIFFERENTIAL/PLATELET
Absolute Monocytes: 934 cells/uL (ref 200–950)
Basophils Absolute: 88 cells/uL (ref 0–200)
Basophils Relative: 1.2 %
Eosinophils Absolute: 161 cells/uL (ref 15–500)
Eosinophils Relative: 2.2 %
HCT: 34.6 % — ABNORMAL LOW (ref 35.0–45.0)
Hemoglobin: 10.8 g/dL — ABNORMAL LOW (ref 11.7–15.5)
Lymphs Abs: 1883 cells/uL (ref 850–3900)
MCH: 23.6 pg — ABNORMAL LOW (ref 27.0–33.0)
MCHC: 31.2 g/dL — ABNORMAL LOW (ref 32.0–36.0)
MCV: 75.5 fL — ABNORMAL LOW (ref 80.0–100.0)
MPV: 12.3 fL (ref 7.5–12.5)
Monocytes Relative: 12.8 %
Neutro Abs: 4234 cells/uL (ref 1500–7800)
Neutrophils Relative %: 58 %
Platelets: 335 10*3/uL (ref 140–400)
RBC: 4.58 10*6/uL (ref 3.80–5.10)
RDW: 14.4 % (ref 11.0–15.0)
Total Lymphocyte: 25.8 %
WBC: 7.3 10*3/uL (ref 3.8–10.8)

## 2019-12-29 LAB — HEPATIC FUNCTION PANEL
AG Ratio: 1.3 (calc) (ref 1.0–2.5)
ALT: 22 U/L (ref 6–29)
AST: 20 U/L (ref 10–35)
Albumin: 4 g/dL (ref 3.6–5.1)
Alkaline phosphatase (APISO): 69 U/L (ref 31–125)
Bilirubin, Direct: 0.1 mg/dL (ref 0.0–0.2)
Globulin: 3.1 g/dL (calc) (ref 1.9–3.7)
Indirect Bilirubin: 0.3 mg/dL (calc) (ref 0.2–1.2)
Total Bilirubin: 0.4 mg/dL (ref 0.2–1.2)
Total Protein: 7.1 g/dL (ref 6.1–8.1)

## 2019-12-29 LAB — BASIC METABOLIC PANEL
BUN: 16 mg/dL (ref 7–25)
CO2: 27 mmol/L (ref 20–32)
Calcium: 9.4 mg/dL (ref 8.6–10.2)
Chloride: 106 mmol/L (ref 98–110)
Creat: 0.86 mg/dL (ref 0.50–1.10)
Glucose, Bld: 100 mg/dL — ABNORMAL HIGH (ref 65–99)
Potassium: 4.4 mmol/L (ref 3.5–5.3)
Sodium: 140 mmol/L (ref 135–146)

## 2019-12-29 LAB — T4, FREE: Free T4: 1.3 ng/dL (ref 0.8–1.8)

## 2019-12-29 LAB — T3, FREE: T3, Free: 4 pg/mL (ref 2.3–4.2)

## 2019-12-29 LAB — TSH: TSH: 0.02 mIU/L — ABNORMAL LOW

## 2019-12-30 NOTE — Telephone Encounter (Signed)
Yes I added an A1c to her labs

## 2019-12-30 NOTE — Addendum Note (Signed)
Addended by: Gershon Crane A on: 12/30/2019 07:29 AM   Modules accepted: Orders

## 2019-12-30 NOTE — Telephone Encounter (Signed)
We will get this at the same time as the anemia labs we ordered

## 2020-01-03 ENCOUNTER — Other Ambulatory Visit (HOSPITAL_COMMUNITY)
Admission: RE | Admit: 2020-01-03 | Discharge: 2020-01-03 | Disposition: A | Discharge status: Home / Self Care | Payer: 59 | Source: Ambulatory Visit | Attending: Family Medicine | Admitting: Family Medicine

## 2020-01-03 ENCOUNTER — Other Ambulatory Visit: Payer: Self-pay

## 2020-01-03 DIAGNOSIS — D619 Aplastic anemia, unspecified: Secondary | ICD-10-CM | POA: Insufficient documentation

## 2020-01-03 DIAGNOSIS — R739 Hyperglycemia, unspecified: Secondary | ICD-10-CM | POA: Insufficient documentation

## 2020-01-03 LAB — HEMOGLOBIN A1C
Hgb A1c MFr Bld: 6.4 % — ABNORMAL HIGH (ref 4.8–5.6)
Mean Plasma Glucose: 136.98 mg/dL

## 2020-01-03 LAB — IRON AND TIBC
Iron: 31 ug/dL (ref 28–170)
Saturation Ratios: 6 % — ABNORMAL LOW (ref 10.4–31.8)
TIBC: 511 ug/dL — ABNORMAL HIGH (ref 250–450)
UIBC: 480 ug/dL

## 2020-01-03 LAB — FOLATE: Folate: 8.5 ng/mL (ref >5.9)

## 2020-01-03 LAB — FERRITIN: Ferritin: 6 ng/mL — ABNORMAL LOW (ref 11–307)

## 2020-01-03 LAB — VITAMIN B12: Vitamin B-12: 217 pg/mL (ref 180–914)

## 2020-01-09 MED FILL — SUMATRIPTAN SUCCINATE 100 M: 100 | 30 days supply | Qty: 9 | Fill #0

## 2020-01-13 MED FILL — CYCLOBENZAPRINE HCL 10 MG T: 10 | 90 days supply | Qty: 270 | Fill #0

## 2020-01-13 MED FILL — PHENTERMINE 37.5 MG CAPSULE: 37.5 | 30 days supply | Qty: 30 | Fill #2

## 2020-03-14 MED FILL — PHENTERMINE 37.5 MG CAPSULE: 37.5 | 30 days supply | Qty: 30 | Fill #0

## 2020-04-17 ENCOUNTER — Other Ambulatory Visit: Payer: Self-pay | Admitting: Family Medicine

## 2020-04-17 MED FILL — PHENTERMINE 37.5 MG CAPSULE: 37.5 | 30 days supply | Qty: 30 | Fill #1

## 2020-04-17 MED FILL — LEVOTHYROXINE SODIUM 200 MC: 200 | 90 days supply | Qty: 90 | Fill #0

## 2020-05-08 ENCOUNTER — Other Ambulatory Visit: Payer: 59

## 2020-05-21 ENCOUNTER — Encounter: Payer: Self-pay | Admitting: Family Medicine

## 2020-05-21 DIAGNOSIS — G8929 Other chronic pain: Secondary | ICD-10-CM

## 2020-05-21 DIAGNOSIS — R519 Headache, unspecified: Secondary | ICD-10-CM

## 2020-05-22 NOTE — Telephone Encounter (Signed)
I ordered the head CT

## 2020-05-31 ENCOUNTER — Ambulatory Visit (HOSPITAL_COMMUNITY)
Admission: RE | Admit: 2020-05-31 | Discharge: 2020-05-31 | Disposition: A | Discharge status: Home / Self Care | Payer: 59 | Source: Ambulatory Visit | Attending: Family Medicine | Admitting: Family Medicine

## 2020-05-31 DIAGNOSIS — G8929 Other chronic pain: Secondary | ICD-10-CM | POA: Diagnosis not present

## 2020-05-31 DIAGNOSIS — R519 Headache, unspecified: Secondary | ICD-10-CM | POA: Diagnosis not present

## 2020-06-05 ENCOUNTER — Other Ambulatory Visit: Payer: Self-pay

## 2020-06-06 ENCOUNTER — Ambulatory Visit (INDEPENDENT_AMBULATORY_CARE_PROVIDER_SITE_OTHER): Payer: 59 | Admitting: Family Medicine

## 2020-06-06 ENCOUNTER — Other Ambulatory Visit: Payer: Self-pay | Admitting: Family Medicine

## 2020-06-06 ENCOUNTER — Encounter: Payer: Self-pay | Admitting: Family Medicine

## 2020-06-06 VITALS — BP 138/80 | HR 87 | Temp 98.4°F | Ht 67.99 in | Wt 293.4 lb

## 2020-06-06 DIAGNOSIS — E538 Deficiency of other specified B group vitamins: Secondary | ICD-10-CM

## 2020-06-06 DIAGNOSIS — D509 Iron deficiency anemia, unspecified: Secondary | ICD-10-CM | POA: Diagnosis not present

## 2020-06-06 MED ORDER — CYANOCOBALAMIN 1000 MCG/ML IJ SOLN: 1000.0000 ug | INTRAMUSCULAR | 11 refills | Status: DC

## 2020-06-06 MED ORDER — "LUER LOCK SAFETY SYRINGES 22G X 1-1/2"" 3 ML MISC": 1.0000 "application " | 11 refills | Status: DC

## 2020-06-06 MED ORDER — FERROUS SULFATE 325 (65 FE) MG PO TBEC: 325.0000 mg | DELAYED_RELEASE_TABLET | Freq: Two times a day (BID) | ORAL | 3 refills | Status: DC

## 2020-06-06 MED ORDER — CYANOCOBALAMIN 1000 MCG/ML IJ SOLN
1000.0000 ug | Freq: Once | INTRAMUSCULAR | Status: AC
  Administered 2020-06-06: 1000 ug via INTRAMUSCULAR

## 2020-06-06 MED FILL — CYANOCOBALAMIN 1,000 MCG/ML: 1000 | 70 days supply | Qty: 10 | Fill #0

## 2020-06-06 MED FILL — BD 3 ML SYR/NDLE 22G/1-1.5: 22G X 1-1/2 | 350 days supply | Qty: 50 | Fill #0

## 2020-06-06 NOTE — Progress Notes (Signed)
   Subjective:    Patient ID: Lisa Horne, female    DOB: 1975-07-05, 45 y.o.   MRN: [id number]  HPI Here for generalized fatigue and anemia. Her Hgb here last September was low at 10.8. She works ar [employer], and she ran some of her own labs yesterday, and the Hgb has dropped to 8.7. Serum iron is 11, and ferritin is 3.1. Vitamin B12 is borderline low at 237. She has not noticed any blood in the stools or dark stools. She has a Mirena IUD, so she has nor mentrual blood losses. She is not a vegetarian. She is S/P bariatric surgery though. She currently takes a daily multivitamin that contains 48 mg of iron and 100 mcg of B12.    Review of Systems  Constitutional: Positive for fatigue.  Respiratory: Negative.   Cardiovascular: Negative.        Objective:   Physical Exam Constitutional:      Appearance: Normal appearance.  Cardiovascular:     Rate and Rhythm: Normal rate and regular rhythm.     Pulses: Normal pulses.     Heart sounds: Normal heart sounds.  Pulmonary:     Effort: Pulmonary effort is normal.     Breath sounds: Normal breath sounds.  Neurological:     Mental Status: She is alert.           Assessment & Plan:  Anemia, due to a combination of iron deficiency and low b12. She will start taking ferrous sulfate 325 mg BID and she will take B12 shots weekly for 12 weeks. Recheck in 4 weeks.  Gershon Crane, MD

## 2020-06-09 ENCOUNTER — Encounter: Payer: Self-pay | Admitting: Family Medicine

## 2020-06-22 ENCOUNTER — Other Ambulatory Visit: Payer: Self-pay

## 2020-06-22 ENCOUNTER — Encounter: Payer: Self-pay | Admitting: Family Medicine

## 2020-06-22 ENCOUNTER — Ambulatory Visit: Payer: 59 | Admitting: Family Medicine

## 2020-06-22 VITALS — BP 128/82 | HR 74 | Temp 98.0°F | Wt 289.2 lb

## 2020-06-22 DIAGNOSIS — D509 Iron deficiency anemia, unspecified: Secondary | ICD-10-CM | POA: Diagnosis not present

## 2020-06-22 LAB — CBC WITH DIFFERENTIAL/PLATELET
Absolute Monocytes: 1036 cells/uL — ABNORMAL HIGH (ref 200–950)
Basophils Absolute: 86 cells/uL (ref 0–200)
Basophils Relative: 0.9 %
Eosinophils Absolute: 181 cells/uL (ref 15–500)
Eosinophils Relative: 1.9 %
HCT: 31.2 % — ABNORMAL LOW (ref 35.0–45.0)
Hemoglobin: 9.4 g/dL — ABNORMAL LOW (ref 11.7–15.5)
Lymphs Abs: 2014 cells/uL (ref 850–3900)
MCH: 20.4 pg — ABNORMAL LOW (ref 27.0–33.0)
MCHC: 30.1 g/dL — ABNORMAL LOW (ref 32.0–36.0)
MCV: 67.7 fL — ABNORMAL LOW (ref 80.0–100.0)
MPV: 12.2 fL (ref 7.5–12.5)
Monocytes Relative: 10.9 %
Neutro Abs: 6185 cells/uL (ref 1500–7800)
Neutrophils Relative %: 65.1 %
Platelets: 380 10*3/uL (ref 140–400)
RBC: 4.61 10*6/uL (ref 3.80–5.10)
RDW: 17.4 % — ABNORMAL HIGH (ref 11.0–15.0)
Total Lymphocyte: 21.2 %
WBC: 9.5 10*3/uL (ref 3.8–10.8)

## 2020-06-22 LAB — CBC MORPHOLOGY

## 2020-06-22 NOTE — Addendum Note (Signed)
Addended by: Evert Kohl D on: 06/22/2020 03:52 PM   Modules accepted: Orders

## 2020-06-22 NOTE — Addendum Note (Signed)
Addended by: Caeson Filippi D on: 06/22/2020 03:52 PM   Modules accepted: Orders  

## 2020-06-22 NOTE — Progress Notes (Signed)
   Subjective:    Patient ID: Lisa Horne, female    DOB: 09/24/1975, 45 y.o.   MRN: [id number]  HPI Here to follow up on iron deficiency anemia. We saw her 2 and 1/2 weeks ago for fatigue, and her Hgb was 8.7. since then she has been taking iron pills BID and taking b12 shots weekly. She still feels very fatigued, but no new symptoms.   Review of Systems  Constitutional: Positive for fatigue.  Respiratory: Negative.   Cardiovascular: Negative.   Gastrointestinal: Negative.   Genitourinary: Negative.        Objective:   Physical Exam Constitutional:      Appearance: Normal appearance. She is not ill-appearing.  Cardiovascular:     Rate and Rhythm: Normal rate and regular rhythm.     Pulses: Normal pulses.     Heart sounds: Normal heart sounds.  Pulmonary:     Effort: Pulmonary effort is normal.     Breath sounds: Normal breath sounds.  Neurological:     Mental Status: She is alert.           Assessment & Plan:  Anemia, we will check a CBC today to make sure the Hgb is stable.  Gershon Crane, MD

## 2020-07-09 ENCOUNTER — Ambulatory Visit: Payer: 59 | Admitting: Family Medicine

## 2020-08-01 ENCOUNTER — Ambulatory Visit: Payer: 59 | Admitting: Family Medicine

## 2020-08-01 ENCOUNTER — Other Ambulatory Visit: Payer: Self-pay

## 2020-08-01 ENCOUNTER — Encounter: Payer: Self-pay | Admitting: Family Medicine

## 2020-08-01 ENCOUNTER — Other Ambulatory Visit (HOSPITAL_COMMUNITY): Payer: Self-pay

## 2020-08-01 VITALS — BP 130/80 | HR 72 | Temp 98.4°F | Wt 294.0 lb

## 2020-08-01 DIAGNOSIS — K219 Gastro-esophageal reflux disease without esophagitis: Secondary | ICD-10-CM

## 2020-08-01 DIAGNOSIS — E538 Deficiency of other specified B group vitamins: Secondary | ICD-10-CM | POA: Diagnosis not present

## 2020-08-01 DIAGNOSIS — D509 Iron deficiency anemia, unspecified: Secondary | ICD-10-CM

## 2020-08-01 DIAGNOSIS — E039 Hypothyroidism, unspecified: Secondary | ICD-10-CM

## 2020-08-01 MED ORDER — FAMOTIDINE 40 MG PO TABS
40.0000 mg | ORAL_TABLET | Freq: Two times a day (BID) | ORAL | 3 refills | Status: DC
  Filled 2020-08-01: qty 180, 90d supply, fill #0
  Filled 2020-11-03: qty 180, 90d supply, fill #1

## 2020-08-01 MED FILL — Ferrous Sulfate Tab EC 325 MG (65 MG Fe Equivalent): ORAL | 90 days supply | Qty: 180 | Fill #0 | Status: CN

## 2020-08-01 MED FILL — Cyanocobalamin Inj 1000 MCG/ML: INTRAMUSCULAR | 28 days supply | Qty: 4 | Fill #0 | Status: CN

## 2020-08-01 MED FILL — Levothyroxine Sodium Tab 200 MCG: ORAL | 90 days supply | Qty: 90 | Fill #0 | Status: CN

## 2020-08-01 NOTE — Progress Notes (Signed)
   Subjective:    Patient ID: Lisa Horne, female    DOB: 1975-05-18, 45 y.o.   MRN: [id number]  HPI Here to follow up on iron deficiency anemia and B12 deficiency. She feels a little better than she did 2 months ago, but she still deals with fatigue every day. Her last Hgb on 06-22-20 was 9.4. Her last B12 on 06-05-20 was 237. She is taking iron pills BID and B12 shots weekly. She is tolerating the oral iron by drinking lots of water. Her GERD has been well controlled.    Review of Systems  Constitutional: Positive for fatigue.  Respiratory: Negative.   Cardiovascular: Negative.        Objective:   Physical Exam Constitutional:      Appearance: Normal appearance.  Cardiovascular:     Rate and Rhythm: Normal rate and regular rhythm.     Pulses: Normal pulses.     Heart sounds: Normal heart sounds.  Pulmonary:     Effort: Pulmonary effort is normal.     Breath sounds: Normal breath sounds.  Neurological:     Mental Status: She is alert.           Assessment & Plan:  Anemia due to B12 and iron deficiency. Recheck CBC and B12 today. We will also check a thyroid panel. I also noted that taking a PPI can sometimes lead to B12 deficiency, so we will stop her Omeprazole and switch to Pepcid 40 mg BID.  Gershon Crane, MD

## 2020-08-02 ENCOUNTER — Other Ambulatory Visit (HOSPITAL_COMMUNITY): Payer: Self-pay

## 2020-08-02 LAB — CBC WITH DIFFERENTIAL/PLATELET
Basophils Absolute: 0.1 10*3/uL (ref 0.0–0.1)
Basophils Relative: 1.4 % (ref 0.0–3.0)
Eosinophils Absolute: 0.2 10*3/uL (ref 0.0–0.7)
Eosinophils Relative: 2 % (ref 0.0–5.0)
HCT: 31.3 % — ABNORMAL LOW (ref 36.0–46.0)
Hemoglobin: 9.7 g/dL — ABNORMAL LOW (ref 12.0–15.0)
Lymphocytes Relative: 19.8 % (ref 12.0–46.0)
Lymphs Abs: 1.7 10*3/uL (ref 0.7–4.0)
MCHC: 31 g/dL (ref 30.0–36.0)
MCV: 67.6 fl — ABNORMAL LOW (ref 78.0–100.0)
Monocytes Absolute: 1.1 10*3/uL — ABNORMAL HIGH (ref 0.1–1.0)
Monocytes Relative: 12.5 % — ABNORMAL HIGH (ref 3.0–12.0)
Neutro Abs: 5.6 10*3/uL (ref 1.4–7.7)
Neutrophils Relative %: 64.3 % (ref 43.0–77.0)
Platelets: 344 10*3/uL (ref 150.0–400.0)
RBC: 4.64 Mil/uL (ref 3.87–5.11)
RDW: 21.2 % — ABNORMAL HIGH (ref 11.5–15.5)
WBC: 8.7 10*3/uL (ref 4.0–10.5)

## 2020-08-02 LAB — VITAMIN B12: Vitamin B-12: 975 pg/mL — ABNORMAL HIGH (ref 211–911)

## 2020-08-10 ENCOUNTER — Other Ambulatory Visit (HOSPITAL_COMMUNITY): Payer: Self-pay

## 2020-08-11 ENCOUNTER — Other Ambulatory Visit (HOSPITAL_COMMUNITY): Payer: Self-pay

## 2020-08-22 ENCOUNTER — Other Ambulatory Visit (HOSPITAL_COMMUNITY): Payer: Self-pay

## 2020-08-22 MED FILL — Levothyroxine Sodium Tab 200 MCG: ORAL | 90 days supply | Qty: 90 | Fill #0 | Status: AC

## 2020-08-22 MED FILL — Cyanocobalamin Inj 1000 MCG/ML: INTRAMUSCULAR | 70 days supply | Qty: 10 | Fill #0 | Status: AC

## 2020-10-04 ENCOUNTER — Encounter (INDEPENDENT_AMBULATORY_CARE_PROVIDER_SITE_OTHER): Payer: Self-pay

## 2020-10-15 ENCOUNTER — Ambulatory Visit (INDEPENDENT_AMBULATORY_CARE_PROVIDER_SITE_OTHER): Payer: 59 | Admitting: Family Medicine

## 2020-10-15 ENCOUNTER — Encounter: Payer: Self-pay | Admitting: Family Medicine

## 2020-10-15 ENCOUNTER — Other Ambulatory Visit: Payer: Self-pay

## 2020-10-15 ENCOUNTER — Other Ambulatory Visit (HOSPITAL_COMMUNITY): Payer: Self-pay

## 2020-10-15 VITALS — BP 124/80 | HR 79 | Temp 98.4°F | Ht 68.5 in | Wt 291.0 lb

## 2020-10-15 DIAGNOSIS — L989 Disorder of the skin and subcutaneous tissue, unspecified: Secondary | ICD-10-CM | POA: Diagnosis not present

## 2020-10-15 DIAGNOSIS — Z0000 Encounter for general adult medical examination without abnormal findings: Secondary | ICD-10-CM | POA: Diagnosis not present

## 2020-10-15 MED ORDER — VALACYCLOVIR HCL 500 MG PO TABS
500.0000 mg | ORAL_TABLET | Freq: Two times a day (BID) | ORAL | 5 refills | Status: DC
  Filled 2020-10-15 – 2020-11-03 (×2): qty 60, 30d supply, fill #0
  Filled 2021-04-16: qty 60, 30d supply, fill #1
  Filled 2021-06-19: qty 60, 30d supply, fill #2
  Filled 2021-10-14: qty 60, 30d supply, fill #3

## 2020-10-15 NOTE — Progress Notes (Signed)
Subjective:    Patient ID: Lisa Horne, female    DOB: [birth year], 45 y.o.   MRN: [id number]  HPI Here for a well exam. She has a few issues to discuss. She has been taking Pepcid BID for her GERD. We stopped the PPI because of her B12 deficiency. She had been taking a total of 250 mcg a day of Synthroid, but several months ago she stopped taking the 50 mcg pill and now she only takes 200 a day. She was due for a colonoscopy in 2016 (her sister had colon cancer) but has not done so. She has not had a mammogram for years. She also asks [place] to check a lesion on her left forearm that appeared about 2 years ago. It does not seem to be changing.    Review of Systems  Constitutional: Negative.   HENT: Negative.    Eyes: Negative.   Respiratory: Negative.    Cardiovascular: Negative.   Gastrointestinal: Negative.   Genitourinary:  Negative for decreased urine volume, difficulty urinating, dyspareunia, dysuria, enuresis, flank pain, frequency, hematuria, pelvic pain and urgency.  Musculoskeletal: Negative.   Skin: Negative.   Neurological: Negative.  Negative for headaches.  Psychiatric/Behavioral: Negative.        Objective:   Physical Exam Constitutional:      General: She is not in acute distress.    Appearance: She is well-developed. She is obese.  HENT:     Head: Normocephalic and atraumatic.     Right Ear: External ear normal.     Left Ear: External ear normal.     Nose: Nose normal.     Mouth/Throat:     Pharynx: No oropharyngeal exudate.  Eyes:     General: No scleral icterus.    Conjunctiva/sclera: Conjunctivae normal.     Pupils: Pupils are equal, round, and reactive to light.  Neck:     Thyroid: No thyromegaly.     Vascular: No JVD.  Cardiovascular:     Rate and Rhythm: Normal rate and regular rhythm.     Heart sounds: Normal heart sounds. No murmur heard.   No friction rub. No gallop.  Pulmonary:     Effort: Pulmonary effort is normal. No respiratory distress.      Breath sounds: Normal breath sounds. No wheezing or rales.  Chest:     Chest wall: No tenderness.  Abdominal:     General: Bowel sounds are normal. There is no distension.     Palpations: Abdomen is soft. There is no mass.     Tenderness: There is no abdominal tenderness. There is no guarding or rebound.  Musculoskeletal:        General: No tenderness. Normal range of motion.     Cervical back: Normal range of motion and neck supple.  Lymphadenopathy:     Cervical: No cervical adenopathy.  Skin:    General: Skin is warm and dry.     Findings: No erythema or rash.     Comments: There is a 5 mm nonpigmented nodular lesion on the dorsal left forearm   Neurological:     Mental Status: She is alert and oriented to person, place, and time.     Cranial Nerves: No cranial nerve deficit.     Motor: No abnormal muscle tone.     Coordination: Coordination normal.     Deep Tendon Reflexes: Reflexes are normal and symmetric. Reflexes normal.  Psychiatric:        Behavior: Behavior normal.  Thought Content: Thought content normal.        Judgment: Judgment normal.          Assessment & Plan:  Well exam. We discussed diet and exercise. Set up fasting labs soon. We are treating her for anemia, and her last Hgb on 08-01-20 was 9.7. We check a CBC, B12, iron, and ferritin. She will get a mammogram soon. Refer to Dermatology for the left forearm lesion, this could be a basal cell cancer. Refer to GI for another colonoscopy.  Gershon Crane, MD

## 2020-10-17 ENCOUNTER — Other Ambulatory Visit (HOSPITAL_COMMUNITY)
Admission: RE | Admit: 2020-10-17 | Discharge: 2020-10-17 | Disposition: A | Discharge status: Home / Self Care | Payer: 59 | Source: Ambulatory Visit | Attending: Family Medicine | Admitting: Family Medicine

## 2020-10-17 DIAGNOSIS — E039 Hypothyroidism, unspecified: Secondary | ICD-10-CM | POA: Diagnosis not present

## 2020-10-17 DIAGNOSIS — Z0000 Encounter for general adult medical examination without abnormal findings: Secondary | ICD-10-CM | POA: Diagnosis not present

## 2020-10-17 LAB — HEPATIC FUNCTION PANEL
ALT: 19 U/L (ref 0–44)
AST: 20 U/L (ref 15–41)
Albumin: 3.7 g/dL (ref 3.5–5.0)
Alkaline Phosphatase: 67 U/L (ref 38–126)
Bilirubin, Direct: 0.1 mg/dL (ref 0.0–0.2)
Indirect Bilirubin: 0.4 mg/dL (ref 0.3–0.9)
Total Bilirubin: 0.5 mg/dL (ref 0.3–1.2)
Total Protein: 7.5 g/dL (ref 6.5–8.1)

## 2020-10-17 LAB — CBC WITH DIFFERENTIAL/PLATELET
Abs Immature Granulocytes: 0.01 10*3/uL (ref 0.00–0.07)
Basophils Absolute: 0.1 10*3/uL (ref 0.0–0.1)
Basophils Relative: 1 %
Eosinophils Absolute: 0.2 10*3/uL (ref 0.0–0.5)
Eosinophils Relative: 3 %
HCT: 35.3 % — ABNORMAL LOW (ref 36.0–46.0)
Hemoglobin: 10.5 g/dL — ABNORMAL LOW (ref 12.0–15.0)
Immature Granulocytes: 0 %
Lymphocytes Relative: 26 %
Lymphs Abs: 1.6 10*3/uL (ref 0.7–4.0)
MCH: 22.4 pg — ABNORMAL LOW (ref 26.0–34.0)
MCHC: 29.7 g/dL — ABNORMAL LOW (ref 30.0–36.0)
MCV: 75.4 fL — ABNORMAL LOW (ref 80.0–100.0)
Monocytes Absolute: 0.9 10*3/uL (ref 0.1–1.0)
Monocytes Relative: 15 %
Neutro Abs: 3.4 10*3/uL (ref 1.7–7.7)
Neutrophils Relative %: 55 %
Platelets: 323 10*3/uL (ref 150–400)
RBC: 4.68 MIL/uL (ref 3.87–5.11)
RDW: 17.2 % — ABNORMAL HIGH (ref 11.5–15.5)
WBC: 6.1 10*3/uL (ref 4.0–10.5)
nRBC: 0 % (ref 0.0–0.2)

## 2020-10-17 LAB — BASIC METABOLIC PANEL
Anion gap: 8 (ref 5–15)
BUN: 18 mg/dL (ref 6–20)
CO2: 25 mmol/L (ref 22–32)
Calcium: 8.6 mg/dL — ABNORMAL LOW (ref 8.9–10.3)
Chloride: 102 mmol/L (ref 98–111)
Creatinine, Ser: 0.75 mg/dL (ref 0.44–1.00)
GFR, Estimated: >60 mL/min (ref >60)
Glucose, Bld: 123 mg/dL — ABNORMAL HIGH (ref 70–99)
Potassium: 4 mmol/L (ref 3.5–5.1)
Sodium: 135 mmol/L (ref 135–145)

## 2020-10-17 LAB — IRON AND TIBC
Iron: 34 ug/dL (ref 28–170)
Saturation Ratios: 7 % — ABNORMAL LOW (ref 10.4–31.8)
TIBC: 508 ug/dL — ABNORMAL HIGH (ref 250–450)
UIBC: 474 ug/dL

## 2020-10-17 LAB — FERRITIN: Ferritin: 6 ng/mL — ABNORMAL LOW (ref 11–307)

## 2020-10-17 LAB — LIPID PANEL
Cholesterol: 181 mg/dL (ref 0–200)
HDL: 48 mg/dL (ref >40)
LDL Cholesterol: 121 mg/dL — ABNORMAL HIGH (ref 0–99)
Total CHOL/HDL Ratio: 3.8 RATIO
Triglycerides: 62 mg/dL (ref <150)
VLDL: 12 mg/dL (ref 0–40)

## 2020-10-17 LAB — T4, FREE: Free T4: 1.45 ng/dL — ABNORMAL HIGH (ref 0.61–1.12)

## 2020-10-17 LAB — VITAMIN B12: Vitamin B-12: 550 pg/mL (ref 180–914)

## 2020-10-17 LAB — TSH: TSH: 0.058 u[IU]/mL — ABNORMAL LOW (ref 0.350–4.500)

## 2020-10-18 ENCOUNTER — Other Ambulatory Visit: Payer: Self-pay

## 2020-10-18 ENCOUNTER — Other Ambulatory Visit (HOSPITAL_COMMUNITY): Payer: Self-pay

## 2020-10-18 DIAGNOSIS — E039 Hypothyroidism, unspecified: Secondary | ICD-10-CM

## 2020-10-18 LAB — HEMOGLOBIN A1C
Hgb A1c MFr Bld: 6.4 % — ABNORMAL HIGH (ref 4.8–5.6)
Mean Plasma Glucose: 137 mg/dL

## 2020-10-18 LAB — T3, FREE: T3, Free: 3.4 pg/mL (ref 2.0–4.4)

## 2020-10-18 MED ORDER — LEVOTHYROXINE SODIUM 175 MCG PO TABS
175.0000 ug | ORAL_TABLET | Freq: Every day | ORAL | 3 refills | Status: DC
  Filled 2020-10-18 – 2020-11-03 (×2): qty 90, 90d supply, fill #0
  Filled 2021-04-08: qty 90, 90d supply, fill #1
  Filled 2021-06-19: qty 90, 90d supply, fill #2
  Filled 2021-10-14: qty 90, 90d supply, fill #3

## 2020-10-24 ENCOUNTER — Other Ambulatory Visit (HOSPITAL_COMMUNITY): Payer: Self-pay

## 2020-10-26 ENCOUNTER — Other Ambulatory Visit (HOSPITAL_COMMUNITY): Payer: Self-pay

## 2020-10-29 ENCOUNTER — Other Ambulatory Visit (HOSPITAL_COMMUNITY): Payer: Self-pay

## 2020-11-03 ENCOUNTER — Encounter: Payer: Self-pay | Admitting: Family Medicine

## 2020-11-03 ENCOUNTER — Other Ambulatory Visit (HOSPITAL_COMMUNITY): Payer: Self-pay

## 2020-11-03 MED FILL — Ferrous Sulfate Tab EC 325 MG (65 MG Fe Equivalent): ORAL | 90 days supply | Qty: 180 | Fill #0 | Status: CN

## 2020-11-03 MED FILL — Cyanocobalamin Inj 1000 MCG/ML: INTRAMUSCULAR | 70 days supply | Qty: 10 | Fill #1 | Status: AC

## 2020-11-05 NOTE — Telephone Encounter (Signed)
Please refill the metformin and cyclobenzaprine for one year. Also send in a [employer] 2 with all supplies

## 2020-11-06 ENCOUNTER — Other Ambulatory Visit: Payer: Self-pay | Admitting: Family Medicine

## 2020-11-06 ENCOUNTER — Other Ambulatory Visit (HOSPITAL_COMMUNITY): Payer: Self-pay

## 2020-11-06 MED ORDER — FREESTYLE LIBRE 14 DAY SENSOR MISC
6 refills | Status: DC
  Filled 2020-11-06: qty 2, 28d supply, fill #0

## 2020-11-06 MED ORDER — FREESTYLE LIBRE 2 SENSOR MISC
6 refills | Status: DC
  Filled 2020-11-06: qty 2, 28d supply, fill #0
  Filled 2020-11-15: qty 2, 28d supply, fill #1
  Filled 2021-04-08: qty 2, 28d supply, fill #2
  Filled 2021-04-16 – 2021-04-18 (×2): qty 2, 28d supply, fill #3
  Filled 2021-06-19: qty 2, 28d supply, fill #4

## 2020-11-06 MED ORDER — FREESTYLE LIBRE 2 READER DEVI
1 refills | Status: DC
  Filled 2020-11-06: qty 1, 30d supply, fill #0

## 2020-11-06 MED ORDER — METFORMIN HCL 500 MG PO TABS: ORAL_TABLET | ORAL | 3 refills | Status: DC

## 2020-11-06 MED ORDER — FREESTYLE LIBRE 14 DAY READER DEVI
1 refills | Status: DC
  Filled 2020-11-06: qty 1, 30d supply, fill #0

## 2020-11-06 MED ORDER — FAMOTIDINE 40 MG PO TABS
40.0000 mg | ORAL_TABLET | Freq: Two times a day (BID) | ORAL | 3 refills | Status: DC
  Filled 2021-04-16: qty 180, 90d supply, fill #0

## 2020-11-06 NOTE — Telephone Encounter (Signed)
New Rxs sent.

## 2020-11-06 NOTE — Telephone Encounter (Signed)
Please take care of this.  

## 2020-11-07 ENCOUNTER — Other Ambulatory Visit (HOSPITAL_COMMUNITY): Payer: Self-pay

## 2020-11-07 ENCOUNTER — Other Ambulatory Visit: Payer: Self-pay | Admitting: Family Medicine

## 2020-11-07 MED ORDER — METFORMIN HCL 500 MG PO TABS
ORAL_TABLET | ORAL | 1 refills | Status: DC
  Filled 2020-11-07 – 2021-06-19 (×3): qty 180, 90d supply, fill #0
  Filled 2021-10-14: qty 180, 90d supply, fill #1

## 2020-11-14 ENCOUNTER — Other Ambulatory Visit (HOSPITAL_COMMUNITY): Payer: Self-pay

## 2020-11-15 ENCOUNTER — Other Ambulatory Visit (HOSPITAL_COMMUNITY): Payer: Self-pay

## 2021-01-18 DIAGNOSIS — B078 Other viral warts: Secondary | ICD-10-CM | POA: Diagnosis not present

## 2021-01-18 DIAGNOSIS — L538 Other specified erythematous conditions: Secondary | ICD-10-CM | POA: Diagnosis not present

## 2021-01-18 DIAGNOSIS — Z789 Other specified health status: Secondary | ICD-10-CM | POA: Diagnosis not present

## 2021-01-27 MED FILL — Cyanocobalamin Inj 1000 MCG/ML: INTRAMUSCULAR | 70 days supply | Qty: 10 | Fill #2 | Status: CN

## 2021-01-28 ENCOUNTER — Other Ambulatory Visit (HOSPITAL_COMMUNITY): Payer: Self-pay

## 2021-02-05 ENCOUNTER — Other Ambulatory Visit (HOSPITAL_COMMUNITY): Payer: Self-pay

## 2021-02-06 ENCOUNTER — Encounter: Payer: Self-pay | Admitting: Internal Medicine

## 2021-02-06 ENCOUNTER — Other Ambulatory Visit: Payer: Self-pay | Admitting: Family Medicine

## 2021-02-06 DIAGNOSIS — Z1231 Encounter for screening mammogram for malignant neoplasm of breast: Secondary | ICD-10-CM

## 2021-02-14 ENCOUNTER — Other Ambulatory Visit (HOSPITAL_COMMUNITY): Payer: Self-pay

## 2021-02-14 ENCOUNTER — Telehealth: Payer: 59 | Admitting: Physician Assistant

## 2021-02-14 DIAGNOSIS — R300 Dysuria: Secondary | ICD-10-CM

## 2021-02-14 MED ORDER — CEPHALEXIN 500 MG PO CAPS
500.0000 mg | ORAL_CAPSULE | Freq: Two times a day (BID) | ORAL | 0 refills | Status: AC
  Filled 2021-02-14: qty 14, 7d supply, fill #0

## 2021-02-14 NOTE — Progress Notes (Signed)
Virtual Visit Consent   Lisa Horne, you are scheduled for a virtual visit with a [facility] provider today.     Just as with appointments in the office, your consent must be obtained to participate.  Your consent will be active for this visit and any virtual visit you may have with one of our providers in the next 365 days.     If you have a MyChart account, a copy of this consent can be sent to you electronically.  All virtual visits are billed to your insurance company just like a traditional visit in the office.    As this is a virtual visit, video technology does not allow for your provider to perform a traditional examination.  This may limit your provider's ability to fully assess your condition.  If your provider identifies any concerns that need to be evaluated in person or the need to arrange testing (such as labs, EKG, etc.), we will make arrangements to do so.     Although advances in technology are sophisticated, we cannot ensure that it will always work on either your end or our end.  If the connection with a video visit is poor, the visit may have to be switched to a telephone visit.  With either a video or telephone visit, we are not always able to ensure that we have a secure connection.     I need to obtain your verbal consent now.   Are you willing to proceed with your visit today?    CORRINNA KARAPETYAN has provided verbal consent on 02/14/2021 for a virtual visit (video or telephone).   Lisa Horne, [place]   Date: 02/14/2021 9:02 AM   Virtual Visit via Video Note   I, Lisa Horne, connected with  Lisa Horne  ([id number], [birth year]) on 02/14/21 at  9:00 AM EDT by a video-enabled telemedicine application and verified that I am speaking with the correct person using two identifiers.  Location: Patient: Virtual Visit Location Patient: Other: Work Provider: Pharmacist, community: Home Office   I discussed the limitations of evaluation  and management by telemedicine and the availability of in person appointments. The patient expressed understanding and agreed to proceed.    History of Present Illness: Lisa Horne is a 45 y.o. who identifies as a female who was assigned female at birth, and is being seen today for possible UTI. Endorses symptoms starting 2 days ago with malaise, dysuria, urinary urgency, frequency, cloudy urine. Notes some suprapubic pressure but good bladder bladder emptying. Denies fevers or chills but notes she has felt a little warm. Noted one episode of vomiting yesterday. None sense. Denies flank pain.   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 06/13/2011   BACK PAIN, LUMBAR 04/30/2010   SPRAIN&STRAIN OF UNSPECIFIED SITE OF HIP&THIGH 11/09/2009   Hypothyroidism 12/02/2006   HYPERTENSION 12/02/2006    Allergies:  Allergies  Allergen Reactions   Ciprofloxacin     REACTION: severe headache/vomiting   Hydrochlorothiazide W-Triamterene     REACTION: malaise   Medications:  Current Outpatient Medications:    cephALEXin (KEFLEX) 500 MG capsule, Take 1 capsule (500 mg total) by mouth 2 (two) times daily for  7 days., Disp: 14 capsule, Rfl: 0   Continuous Blood Gluc Receiver (FREESTYLE LIBRE 14 DAY READER) DEVI, USE AS DIRECTED, Disp: 2 each, Rfl: 1   Continuous Blood Gluc Receiver (FREESTYLE LIBRE 2 READER) DEVI, Use as directed, Disp: 2 each, Rfl: 1   Continuous Blood Gluc Sensor (FREESTYLE LIBRE 2 SENSOR) MISC, Use as directed, Disp: 2 each, Rfl: 6   cyanocobalamin (,VITAMIN B-12,) 1000 MCG/ML injection, INJECT 1 ML (1,000 MCG TOTAL) INTO THE MUSCLE ONCE A WEEK., Disp: 10 mL, Rfl: 11   cyclobenzaprine (FLEXERIL) 10 MG  tablet, Take 1 tablet (10 mg total) by mouth 3 (three) times daily as needed for muscle spasms., Disp: 270 tablet, Rfl: 3   famotidine (PEPCID) 40 MG tablet, Take 1 tablet (40 mg total) by mouth 2 (two) times daily., Disp: 180 tablet, Rfl: 3   ferrous sulfate 325 (65 FE) MG EC tablet, TAKE 1 TABLET BY MOUTH 2 TIMES DAILY, Disp: 180 tablet, Rfl: 3   ibuprofen (ADVIL,MOTRIN) 800 MG tablet, Take 800 mg by mouth every 8 (eight) hours as needed., Disp: , Rfl:    levonorgestrel (MIRENA) 20 MCG/24HR IUD, 1 each by Intrauterine route once., Disp: , Rfl:    levothyroxine (SYNTHROID) 175 MCG tablet, Take 1 tablet (175 mcg total) by mouth daily., Disp: 90 tablet, Rfl: 3   metFORMIN (GLUCOPHAGE) 500 MG tablet, Take 2 tablets by mouth with the evening meal, Disp: 180 tablet, Rfl: 1   Multiple Vitamins-Minerals (BARIATRIC MULTIVITAMINS/IRON) CAPS, Take 1 capsule by mouth daily., Disp: 90 capsule, Rfl: 3   phentermine 37.5 MG capsule, Take 37.5 mg by mouth daily., Disp: , Rfl:    SYRINGE-NEEDLE, DISP, 3 ML 22G X 1-1/2" 3 ML MISC, USE AS DIRECTED ONCE A WEEK, Disp: 50 each, Rfl: 11   valACYclovir (VALTREX) 500 MG tablet, Take 1 tablet (500 mg total) by mouth 2 (two) times daily., Disp: 60 tablet, Rfl: 5  Observations/Objective: Patient is well-developed, well-nourished in no acute distress.  Resting comfortably at home.  Head is normocephalic, atraumatic.  No labored breathing. Speech is clear and coherent with logical content.  Patient is alert and oriented at baseline.   Assessment and Plan: 1. Dysuria - cephALEXin (KEFLEX) 500 MG capsule; Take 1 capsule (500 mg total) by mouth 2 (two) times daily for 7 days.  Dispense: 14 capsule; Refill: 0 Milder symptoms with alarm signs/symptoms. 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 Will start empiric treatment for uncomplicated cystitis with Keflex 500 mg BID x 7 days. Supportive measures and OTC medications reviewed. Will need  in-person evaluation for any non-resolving, new or worsening symptoms.  Follow Up Instructions: I discussed the assessment and treatment plan with the patient. The patient was provided an opportunity to ask questions and all were answered. The patient agreed with the plan and demonstrated an understanding of the instructions.  A copy of instructions were sent to the patient via MyChart unless otherwise noted below.   The patient was advised to call back or seek an in-person evaluation if the symptoms worsen or if the condition fails to improve as anticipated.  Time:  I spent 10 minutes with the patient via telehealth technology discussing the above problems/concerns.    Lisa Climes, PA-C

## 2021-02-14 NOTE — Patient Instructions (Signed)
Lisa Horne, thank you for joining Piedad Climes, PA-C for today's virtual visit.  While this provider is not your primary care provider (PCP), if your PCP is located in our provider database this encounter information will be shared with them immediately following your visit.  Consent: (Patient) Lisa Horne provided verbal consent for this virtual visit at the beginning of the encounter.  Current Medications:  Current Outpatient Medications:    Continuous Blood Gluc Receiver (FREESTYLE LIBRE 14 DAY READER) DEVI, USE AS DIRECTED, Disp: 2 each, Rfl: 1   Continuous Blood Gluc Receiver (FREESTYLE LIBRE 2 READER) DEVI, Use as directed, Disp: 2 each, Rfl: 1   Continuous Blood Gluc Sensor (FREESTYLE LIBRE 2 SENSOR) MISC, Use as directed, Disp: 2 each, Rfl: 6   cyanocobalamin (,VITAMIN B-12,) 1000 MCG/ML injection, INJECT 1 ML (1,000 MCG TOTAL) INTO THE MUSCLE ONCE A WEEK., Disp: 10 mL, Rfl: 11   cyclobenzaprine (FLEXERIL) 10 MG tablet, Take 1 tablet (10 mg total) by mouth 3 (three) times daily as needed for muscle spasms., Disp: 270 tablet, Rfl: 3   famotidine (PEPCID) 40 MG tablet, Take 1 tablet (40 mg total) by mouth 2 (two) times daily., Disp: 180 tablet, Rfl: 3   ferrous sulfate 325 (65 FE) MG EC tablet, TAKE 1 TABLET BY MOUTH 2 TIMES DAILY, Disp: 180 tablet, Rfl: 3   ibuprofen (ADVIL,MOTRIN) 800 MG tablet, Take 800 mg by mouth every 8 (eight) hours as needed., Disp: , Rfl:    levonorgestrel (MIRENA) 20 MCG/24HR IUD, 1 each by Intrauterine route once., Disp: , Rfl:    levothyroxine (SYNTHROID) 175 MCG tablet, Take 1 tablet (175 mcg total) by mouth daily., Disp: 90 tablet, Rfl: 3   metFORMIN (GLUCOPHAGE) 500 MG tablet, Take 2 tablets by mouth with the evening meal, Disp: 180 tablet, Rfl: 1   Multiple Vitamins-Minerals (BARIATRIC MULTIVITAMINS/IRON) CAPS, Take 1 capsule by mouth daily., Disp: 90 capsule, Rfl: 3   phentermine 37.5 MG capsule, Take 37.5 mg by mouth daily., Disp: ,  Rfl:    SYRINGE-NEEDLE, DISP, 3 ML 22G X 1-1/2" 3 ML MISC, USE AS DIRECTED ONCE A WEEK, Disp: 50 each, Rfl: 11   valACYclovir (VALTREX) 500 MG tablet, Take 1 tablet (500 mg total) by mouth 2 (two) times daily., Disp: 60 tablet, Rfl: 5   Medications ordered in this encounter:  No orders of the defined types were placed in this encounter.    *If you need refills on other medications prior to your next appointment, please contact your pharmacy*  Follow-Up: Call back or seek an in-person evaluation if the symptoms worsen or if the condition fails to improve as anticipated.  Other Instructions Your symptoms are consistent with a bladder infection, also called acute cystitis. Please take your antibiotic (Keflex) as directed until all pills are gone.  Stay very well hydrated.  Consider a daily probiotic (Align, Culturelle, or Activia) to help prevent stomach upset caused by the antibiotic.  Taking a probiotic daily may also help prevent recurrent UTIs.  Also consider taking AZO (Phenazopyridine) tablets to help decrease pain with urination.    If symptoms are not resolving or you note any new or worsening symptoms despite treatment, you need an in-person evaluation for examination and for urine testing so we can make sure you get appropriate treatment.   Urinary Tract Infection A urinary tract infection (UTI) can occur any place along the urinary tract. The tract includes the kidneys, ureters, bladder, and urethra. A type of germ called  bacteria often causes a UTI. UTIs are often helped with antibiotic medicine.  HOME CARE  If given, take antibiotics as told by your doctor. Finish them even if you start to feel better. Drink enough fluids to keep your pee (urine) clear or pale yellow. Avoid tea, drinks with caffeine, and bubbly (carbonated) drinks. Pee often. Avoid holding your pee in for a long time. Pee before and after having sex (intercourse). Wipe from front to back after you poop (bowel  movement) if you are a woman. Use each tissue only once. GET HELP RIGHT AWAY IF:  You have back pain. You have lower belly (abdominal) pain. You have chills. You feel sick to your stomach (nauseous). You throw up (vomit). Your burning or discomfort with peeing does not go away. You have a fever. Your symptoms are not better in 3 days. MAKE SURE YOU:  Understand these instructions. Will watch your condition. Will get help right away if you are not doing well or get worse. Document Released: 09/24/2007 Document Revised: 12/31/2011 Document Reviewed: 11/06/2011 [facility] Patient Information 2015 [place], [place]. This information is not intended to replace advice given to you by your health care provider. Make sure you discuss any questions you have with your health care provider.    If you have been instructed to have an in-person evaluation today at a local Urgent Care facility, please use the link below. It will take you to a list of all of our available [facility], including address, phone number and hours of operation. Please do not delay care.  [facility]  If you or a family member do not have a primary care provider, use the link below to schedule a visit and establish care. When you choose a [facility] primary care physician or advanced practice provider, you gain a long-term partner in health. Find a Primary Care Provider  Learn more about [facility] in-office and virtual care options: [facility] - Get Care Now

## 2021-02-17 MED FILL — Cyanocobalamin Inj 1000 MCG/ML: INTRAMUSCULAR | 70 days supply | Qty: 10 | Fill #2 | Status: CN

## 2021-02-18 ENCOUNTER — Other Ambulatory Visit (HOSPITAL_COMMUNITY): Payer: Self-pay

## 2021-02-19 DIAGNOSIS — L905 Scar conditions and fibrosis of skin: Secondary | ICD-10-CM | POA: Diagnosis not present

## 2021-02-26 ENCOUNTER — Other Ambulatory Visit (HOSPITAL_COMMUNITY): Payer: Self-pay

## 2021-03-06 ENCOUNTER — Other Ambulatory Visit: Payer: Self-pay

## 2021-03-06 ENCOUNTER — Other Ambulatory Visit (HOSPITAL_COMMUNITY): Payer: Self-pay

## 2021-03-06 ENCOUNTER — Encounter: Payer: Self-pay | Admitting: Internal Medicine

## 2021-03-06 ENCOUNTER — Ambulatory Visit (AMBULATORY_SURGERY_CENTER): Payer: 59 | Admitting: *Deleted

## 2021-03-06 VITALS — Ht 69.0 in | Wt 295.0 lb

## 2021-03-06 DIAGNOSIS — Z800 Family history of malignant neoplasm of digestive organs: Secondary | ICD-10-CM

## 2021-03-06 DIAGNOSIS — Z8371 Family history of colonic polyps: Secondary | ICD-10-CM

## 2021-03-06 MED ORDER — NA SULFATE-K SULFATE-MG SULF 17.5-3.13-1.6 GM/177ML PO SOLN
1.0000 | Freq: Once | ORAL | 0 refills | Status: AC
  Filled 2021-03-06: qty 354, 1d supply, fill #0

## 2021-03-06 NOTE — Progress Notes (Signed)
PV completed over the phone. Pt verified name, DOB, address and insurance during PV today.  MY CHART instruction packet too pt- she did access her My Chart during PV as went over her instructions today - she will print these at home   Pt encouraged to call with questions or issues.  If pt has My chart, procedure instructions sent via My Chart   NO egg or soy allergy known to patient  PONV issues known to pt with past sedation with any surgeries or procedures Patient denies ever being told they had issues or difficulty with intubation  No FH of Malignant Hyperthermia Pt is not on diet pills- NO PHENTERMINE SINCE 09-2020 Pt is not on  home 02  Pt is not on blood thinners  Pt STATES  issues with constipation THAT ALTERNATES WITH DIARRHEA- she will do 1 capful of Miralax daily starting today and stop her Iron to help soften her stools prior to her Monday colon and will call if she has severe constipation  No A fib or A flutter  Pt is fully vaccinated  for Covid   NO PA's for preps discussed with pt In PV today  Discussed with pt there will be an out-of-pocket cost for prep and that varies from $0 to 70 +  dollars - pt verbalized understanding   Due to the COVID-19 pandemic we are asking patients to follow certain guidelines in PV and the [facility]   Pt aware of COVID protocols and [facility] guidelines

## 2021-03-07 ENCOUNTER — Other Ambulatory Visit (HOSPITAL_COMMUNITY): Payer: Self-pay

## 2021-03-09 ENCOUNTER — Other Ambulatory Visit (HOSPITAL_COMMUNITY): Payer: Self-pay

## 2021-03-09 ENCOUNTER — Encounter: Payer: Self-pay | Admitting: Internal Medicine

## 2021-03-11 ENCOUNTER — Encounter: Payer: Self-pay | Admitting: Internal Medicine

## 2021-03-11 ENCOUNTER — Ambulatory Visit
Admission: RE | Admit: 2021-03-11 | Discharge: 2021-03-11 | Disposition: A | Discharge status: Home / Self Care | Payer: 59 | Source: Ambulatory Visit

## 2021-03-11 ENCOUNTER — Ambulatory Visit (AMBULATORY_SURGERY_CENTER): Payer: 59 | Admitting: Internal Medicine

## 2021-03-11 VITALS — BP 124/55 | HR 68 | Temp 98.4°F | Resp 17 | Ht 68.0 in | Wt 295.0 lb

## 2021-03-11 DIAGNOSIS — Z1231 Encounter for screening mammogram for malignant neoplasm of breast: Secondary | ICD-10-CM

## 2021-03-11 DIAGNOSIS — Z1211 Encounter for screening for malignant neoplasm of colon: Secondary | ICD-10-CM | POA: Diagnosis not present

## 2021-03-11 DIAGNOSIS — Z8371 Family history of colonic polyps: Secondary | ICD-10-CM | POA: Diagnosis not present

## 2021-03-11 HISTORY — PX: COLONOSCOPY: SHX174

## 2021-03-11 MED ORDER — SODIUM CHLORIDE 0.9 % IV SOLN: 500.0000 mL | Freq: Once | INTRAVENOUS | Status: DC

## 2021-03-11 NOTE — Op Note (Signed)
[facility] Patient Name: Lisa Horne Procedure Date: 03/11/2021 3:12 PM MRN: [id number] Endoscopist: Iva Boop , MD Age: 45 Referring MD:  Date of Birth: 1976-04-04 Gender: Female Account #: [id number] Procedure:                Colonoscopy Indications:              Screening for colorectal malignant neoplasm Medicines:                Propofol per Anesthesia, Monitored Anesthesia Care Procedure:                Pre-Anesthesia Assessment:                           - Prior to the procedure, a History and Physical                            was performed, and patient medications and                            allergies were reviewed. The patient's tolerance of                            previous anesthesia was also reviewed. The risks                            and benefits of the procedure and the sedation                            options and risks were discussed with the patient.                            All questions were answered, and informed consent                            was obtained. Prior Anticoagulants: The patient has                            taken no previous anticoagulant or antiplatelet                            agents. ASA Grade Assessment: III - A patient with                            severe systemic disease. After reviewing the risks                            and benefits, the patient was deemed in                            satisfactory condition to undergo the procedure.                           After obtaining informed consent, the colonoscope  was passed under direct vision. Throughout the                            procedure, the patient's blood pressure, pulse, and                            oxygen saturations were monitored continuously. The                            [id number] Conception was introduced through                            the anus and advanced to the the cecum, identified                             by appendiceal orifice and ileocecal valve. The                            colonoscopy was performed without difficulty. The                            patient tolerated the procedure well. The quality                            of the bowel preparation was good. The ileocecal                            valve, appendiceal orifice, and rectum were                            photographed. Scope In: 3:20:30 PM Scope Out: 3:32:41 PM Scope Withdrawal Time: 0 hours 9 minutes 55 seconds  Total Procedure Duration: 0 hours 12 minutes 11 seconds  Findings:                 The perianal and digital rectal examinations were                            normal.                           Multiple diverticula were found in the sigmoid                            colon, descending colon and transverse colon.                           The exam was otherwise without abnormality on                            direct and retroflexion views. Complications:            No immediate complications. Estimated Blood Loss:     Estimated blood loss: none. Impression:               - Diverticulosis in the sigmoid colon, in the  descending colon and in the transverse colon.                           - The examination was otherwise normal on direct                            and retroflexion views.                           - No specimens collected. Recommendation:           - Patient has a contact number available for                            emergencies. The signs and symptoms of potential                            delayed complications were discussed with the                            patient. Return to normal activities tomorrow.                            Written discharge instructions were provided to the                            patient.                           - Resume previous diet.                           - Continue present medications.                            - Repeat colonoscopy in 10 years for screening                            purposes. In past we thought sister had colon                            cancer but it was colon polyp(s) Gatha Mayer, MD 03/11/2021 3:37:21 PM This report has been signed electronically.

## 2021-03-11 NOTE — Progress Notes (Signed)
Gastroenterology History and Physical   Primary Care Physician:  Nelwyn Salisbury, MD   Reason for Procedure:   Colon cancer screening  Plan:    colonoscopy     HPI: Lisa Horne is a 45 y.o. female here for screening colonoscopy. Had one in 2011 at which tie we thought her sister had colon cancer at age 53 but turns out she had polyps not cancer.   Past Medical History:  Diagnosis Date   Allergy    Anemia    GERD (gastroesophageal reflux disease)    Morbid obesity (HCC)    PCOS (polycystic ovarian syndrome)    ON METFOMRIN   PONV (postoperative nausea and vomiting)    Thyroid disease    hypothyroidism    Past Surgical History:  Procedure Laterality Date   COLONOSCOPY  01/01/2010   per Dr. Leone Payor, clear, repeat in 5 yrs (her sister had colon cancer)   GASTRIC BANDING PORT REVISION     GASTRIC RESTRICTION SURGERY     lap band 2005   LAPAROSCOPIC GASTRIC SLEEVE RESECTION      Prior to Admission medications   Medication Sig Start Date End Date Taking? Authorizing Provider  Continuous Blood Gluc Receiver (FREESTYLE LIBRE 14 DAY READER) DEVI USE AS DIRECTED 11/06/20  Yes Nelwyn Salisbury, MD  Continuous Blood Gluc Receiver (FREESTYLE LIBRE 2 READER) DEVI Use as directed 11/06/20  Yes Nelwyn Salisbury, MD  Continuous Blood Gluc Sensor (FREESTYLE LIBRE 2 SENSOR) MISC Use as directed 11/06/20  Yes Nelwyn Salisbury, MD  cyanocobalamin (,VITAMIN B-12,) 1000 MCG/ML injection INJECT 1 ML (1,000 MCG TOTAL) INTO THE MUSCLE ONCE A WEEK. 06/06/20 06/06/21 Yes Nelwyn Salisbury, MD  famotidine (PEPCID) 40 MG tablet Take 1 tablet (40 mg total) by mouth 2 (two) times daily. 11/06/20  Yes Nelwyn Salisbury, MD  levonorgestrel (MIRENA) 20 MCG/24HR IUD 1 each by Intrauterine route once.   Yes [provider]  levothyroxine (SYNTHROID) 175 MCG tablet Take 1 tablet (175 mcg total) by mouth daily. 10/18/20  Yes Nelwyn Salisbury, MD  metFORMIN (GLUCOPHAGE) 500 MG tablet Take 2 tablets by mouth  with the evening meal 11/07/20  Yes Nelwyn Salisbury, MD  Multiple Vitamins-Minerals (BARIATRIC MULTIVITAMINS/IRON) CAPS Take 1 capsule by mouth daily. 02/25/19  Yes Nelwyn Salisbury, MD  SYRINGE-NEEDLE, DISP, 3 ML 22G X 1-1/2" 3 ML MISC USE AS DIRECTED ONCE A WEEK 06/06/20 06/06/21 Yes Nelwyn Salisbury, MD  Chlorpheniramine Maleate (ALLERGY PO) Take by mouth. BUYS ALLERGY MED ON SALE - VARIES BETWEEN CLARITIN, ZYRTEC, ALLEGRA    [provider]  cyclobenzaprine (FLEXERIL) 10 MG tablet Take 1 tablet (10 mg total) by mouth 3 (three) times daily as needed for muscle spasms. 02/25/19   Nelwyn Salisbury, MD  ferrous sulfate 325 (65 FE) MG EC tablet TAKE 1 TABLET BY MOUTH 2 TIMES DAILY 06/06/20 06/06/21  Nelwyn Salisbury, MD  ibuprofen (ADVIL,MOTRIN) 800 MG tablet Take 800 mg by mouth every 8 (eight) hours as needed.    [provider]  phentermine 37.5 MG capsule Take 37.5 mg by mouth daily. Patient not taking: Reported on 03/06/2021 11/24/19   [provider]  valACYclovir (VALTREX) 500 MG tablet Take 1 tablet (500 mg total) by mouth 2 (two) times daily. 10/15/20   Nelwyn Salisbury, MD    Current Outpatient Medications  Medication Sig Dispense Refill   Continuous Blood Gluc Receiver (FREESTYLE LIBRE 14 DAY READER) DEVI USE AS DIRECTED 2  each 1   Continuous Blood Gluc Receiver (FREESTYLE LIBRE 2 READER) DEVI Use as directed 2 each 1   Continuous Blood Gluc Sensor (FREESTYLE LIBRE 2 SENSOR) MISC Use as directed 2 each 6   cyanocobalamin (,VITAMIN B-12,) 1000 MCG/ML injection INJECT 1 ML (1,000 MCG TOTAL) INTO THE MUSCLE ONCE A WEEK. 10 mL 11   famotidine (PEPCID) 40 MG tablet Take 1 tablet (40 mg total) by mouth 2 (two) times daily. 180 tablet 3   levonorgestrel (MIRENA) 20 MCG/24HR IUD 1 each by Intrauterine route once.     levothyroxine (SYNTHROID) 175 MCG tablet Take 1 tablet (175 mcg total) by mouth daily. 90 tablet 3   metFORMIN (GLUCOPHAGE) 500 MG tablet Take 2 tablets by mouth with the  evening meal 180 tablet 1   Multiple Vitamins-Minerals (BARIATRIC MULTIVITAMINS/IRON) CAPS Take 1 capsule by mouth daily. 90 capsule 3   SYRINGE-NEEDLE, DISP, 3 ML 22G X 1-1/2" 3 ML MISC USE AS DIRECTED ONCE A WEEK 50 each 11   Chlorpheniramine Maleate (ALLERGY PO) Take by mouth. BUYS ALLERGY MED ON SALE - VARIES BETWEEN CLARITIN, ZYRTEC, ALLEGRA     cyclobenzaprine (FLEXERIL) 10 MG tablet Take 1 tablet (10 mg total) by mouth 3 (three) times daily as needed for muscle spasms. 270 tablet 3   ferrous sulfate 325 (65 FE) MG EC tablet TAKE 1 TABLET BY MOUTH 2 TIMES DAILY 180 tablet 3   ibuprofen (ADVIL,MOTRIN) 800 MG tablet Take 800 mg by mouth every 8 (eight) hours as needed.     phentermine 37.5 MG capsule Take 37.5 mg by mouth daily. (Patient not taking: Reported on 03/06/2021)     valACYclovir (VALTREX) 500 MG tablet Take 1 tablet (500 mg total) by mouth 2 (two) times daily. 60 tablet 5   Current Facility-Administered Medications  Medication Dose Route Frequency Provider Last Rate Last Admin   0.9 %  sodium chloride infusion  500 mL Intravenous Once Iva Boop, MD        Allergies as of 03/11/2021 - Review Complete 03/11/2021  Allergen Reaction Noted   Ciprofloxacin  12/02/2006   Hydrochlorothiazide w-triamterene  12/02/2006    Family History  Problem Relation Age of Onset   Breast cancer Mother    Hypertension Mother    Cancer Mother        breast   Depression Mother    Diabetes Father    Cancer Father        skin   Colon polyps Sister    Colon cancer Sister    Depression Sister    Colon cancer Maternal Uncle    Esophageal cancer Neg Hx    Rectal cancer Neg Hx    Stomach cancer Neg Hx     Social History   Socioeconomic History   Marital status: Married    Spouse name: Not on file   Number of children: Not on file   Years of education: Not on file   Highest education level: Not on file  Occupational History   Not on file  Tobacco Use   Smoking status: Never    Smokeless tobacco: Never  Vaping Use   Vaping Use: Never used  Substance and Sexual Activity   Alcohol use: No    Alcohol/week: 0.0 standard drinks   Drug use: No   Sexual activity: Yes    Birth control/protection: I.U.D.     Review of Systems:  All other review of systems negative except as mentioned in the HPI.  Physical Exam:  Vital signs BP 122/75   Pulse 79   Temp 98.4 F (36.9 C)   Ht 5\' 8"  (1.727 m)   Wt 295 lb (133.8 kg)   LMP  (LMP Unknown)   SpO2 97%   BMI 44.85 kg/m   General:   Alert,  Well-developed, well-nourished, pleasant and cooperative in NAD Lungs:  Clear throughout to auscultation.   Heart:  Regular rate and rhythm; no murmurs, clicks, rubs,  or gallops. Abdomen:  Soft, nontender and nondistended. Normal bowel sounds.   Neuro/Psych:  Alert and cooperative. Normal mood and affect. A and O x 3   @Breionna Punt  , MD, [facility] [phone] (pager) 03/11/2021 3:11 PM@

## 2021-03-11 NOTE — Progress Notes (Signed)
VS BY DT.

## 2021-03-11 NOTE — Patient Instructions (Addendum)
No polyps or cancer seen. You do have diverticulosis - thickened muscle rings and pouches in the colon wall. Please read the handout about this condition.  Next routine colonoscopy or other screening test in 10 years - 2032.  I appreciate the opportunity to care for you. Iva Boop, MD, [facility]   Handout on diverticulosis given to patient.  No repeat colonoscopy for 10 years for screening purposes!! Resume previous diet and continue present medications.   YOU HAD AN ENDOSCOPIC PROCEDURE TODAY AT THE [facility]:   Refer to the procedure report that was given to you for any specific questions about what was found during the examination.  If the procedure report does not answer your questions, please call your gastroenterologist to clarify.  If you requested that your care partner not be given the details of your procedure findings, then the procedure report has been included in a sealed envelope for you to review at your convenience later.  YOU SHOULD EXPECT: Some feelings of bloating in the abdomen. Passage of more gas than usual.  Walking can help get rid of the air that was put into your GI tract during the procedure and reduce the bloating. If you had a lower endoscopy (such as a colonoscopy or flexible sigmoidoscopy) you may notice spotting of blood in your stool or on the toilet paper. If you underwent a bowel prep for your procedure, you may not have a normal bowel movement for a few days.  Please Note:  You might notice some irritation and congestion in your nose or some drainage.  This is from the oxygen used during your procedure.  There is no need for concern and it should clear up in a day or so.  SYMPTOMS TO REPORT IMMEDIATELY:  Following lower endoscopy (colonoscopy or flexible sigmoidoscopy):  Excessive amounts of blood in the stool  Significant tenderness or worsening of abdominal pains  Swelling of the abdomen that is new, acute  Fever of 100F or  higher   For urgent or emergent issues, a gastroenterologist can be reached at any hour by calling [phone]. Do not use MyChart messaging for urgent concerns.    DIET:  We do recommend a small meal at first, but then you may proceed to your regular diet.  Drink plenty of fluids but you should avoid alcoholic beverages for 24 hours.  ACTIVITY:  You should plan to take it easy for the rest of today and you should NOT DRIVE or use heavy machinery until tomorrow (because of the sedation medicines used during the test).    FOLLOW UP: Our staff will call the number listed on your records 48-72 hours following your procedure to check on you and address any questions or concerns that you may have regarding the information given to you following your procedure. If we do not reach you, we will leave a message.  We will attempt to reach you two times.  During this call, we will ask if you have developed any symptoms of COVID 19. If you develop any symptoms (ie: fever, flu-like symptoms, shortness of breath, cough etc.) before then, please call [phone].  If you test positive for Covid 19 in the 2 weeks post procedure, please call and report this information to [place].    If any biopsies were taken you will be contacted by phone or by letter within the next 1-3 weeks.  Please call us at [phone] if you have not heard about the biopsies in 3  weeks.    SIGNATURES/CONFIDENTIALITY: You and/or your care partner have signed paperwork which will be entered into your electronic medical record.  These signatures attest to the fact that that the information above on your After Visit Summary has been reviewed and is understood.  Full responsibility of the confidentiality of this discharge information lies with you and/or your care-partner.

## 2021-03-11 NOTE — Progress Notes (Signed)
A and O x3. Report to RN. Tolerated MAC anesthesia well. 

## 2021-03-12 ENCOUNTER — Other Ambulatory Visit: Payer: Self-pay | Admitting: Family Medicine

## 2021-03-12 DIAGNOSIS — R928 Other abnormal and inconclusive findings on diagnostic imaging of breast: Secondary | ICD-10-CM

## 2021-03-13 ENCOUNTER — Telehealth: Payer: Self-pay

## 2021-03-13 NOTE — Telephone Encounter (Signed)
  Follow up Call-  Call back number 03/11/2021  Post procedure Call Back phone  # [phone]  Permission to leave phone message Yes  Some recent data might be hidden     Patient questions:  Do you have a fever, pain , or abdominal swelling? No. Pain Score  0 *  Have you tolerated food without any problems? Yes.    Have you been able to return to your normal activities? Yes.    Do you have any questions about your discharge instructions: Diet   No. Medications  No. Follow up visit  No.  Do you have questions or concerns about your Care? No.  Actions: * If pain score is 4 or above: No action needed, pain <4.

## 2021-03-27 NOTE — Telephone Encounter (Signed)
Attempted f/u call. Will try again this afternoon. 

## 2021-04-08 ENCOUNTER — Other Ambulatory Visit (HOSPITAL_COMMUNITY): Payer: Self-pay

## 2021-04-08 MED FILL — Cyanocobalamin Inj 1000 MCG/ML: INTRAMUSCULAR | 70 days supply | Qty: 10 | Fill #2 | Status: AC

## 2021-04-10 ENCOUNTER — Other Ambulatory Visit: Payer: 59

## 2021-04-16 ENCOUNTER — Other Ambulatory Visit (HOSPITAL_COMMUNITY): Payer: Self-pay

## 2021-04-16 ENCOUNTER — Telehealth: Payer: Self-pay

## 2021-04-16 ENCOUNTER — Other Ambulatory Visit: Payer: Self-pay | Admitting: Family Medicine

## 2021-04-16 MED ORDER — PHENTERMINE HCL 37.5 MG PO CAPS
ORAL_CAPSULE | ORAL | 0 refills | Status: DC
  Filled 2021-04-16: qty 30, 30d supply, fill #0

## 2021-04-16 MED ORDER — PHENTERMINE HCL 37.5 MG PO CAPS: 37.5000 mg | ORAL_CAPSULE | Freq: Every day | ORAL | 0 refills | Status: DC

## 2021-04-16 MED FILL — Ferrous Sulfate Tab EC 325 MG (65 MG Fe Equivalent): ORAL | 100 days supply | Qty: 200 | Fill #0 | Status: CN

## 2021-04-16 NOTE — Telephone Encounter (Signed)
This note was placed in the wrong patient's chart, so we will disregard this.

## 2021-04-17 ENCOUNTER — Other Ambulatory Visit (HOSPITAL_COMMUNITY): Payer: Self-pay

## 2021-04-18 ENCOUNTER — Other Ambulatory Visit (HOSPITAL_COMMUNITY): Payer: Self-pay

## 2021-04-18 ENCOUNTER — Ambulatory Visit
Admission: RE | Admit: 2021-04-18 | Discharge: 2021-04-18 | Disposition: A | Discharge status: Home / Self Care | Payer: 59 | Source: Ambulatory Visit | Attending: Family Medicine | Admitting: Family Medicine

## 2021-04-18 ENCOUNTER — Other Ambulatory Visit: Payer: Self-pay | Admitting: Family Medicine

## 2021-04-18 DIAGNOSIS — R928 Other abnormal and inconclusive findings on diagnostic imaging of breast: Secondary | ICD-10-CM

## 2021-04-18 DIAGNOSIS — N6321 Unspecified lump in the left breast, upper outer quadrant: Secondary | ICD-10-CM | POA: Diagnosis not present

## 2021-04-19 ENCOUNTER — Other Ambulatory Visit (HOSPITAL_COMMUNITY): Payer: Self-pay

## 2021-04-29 ENCOUNTER — Ambulatory Visit
Admission: RE | Admit: 2021-04-29 | Discharge: 2021-04-29 | Disposition: A | Discharge status: Home / Self Care | Payer: 59 | Source: Ambulatory Visit | Attending: Family Medicine | Admitting: Family Medicine

## 2021-04-29 ENCOUNTER — Other Ambulatory Visit: Payer: Self-pay

## 2021-04-29 ENCOUNTER — Other Ambulatory Visit: Payer: Self-pay | Admitting: Family Medicine

## 2021-04-29 DIAGNOSIS — R928 Other abnormal and inconclusive findings on diagnostic imaging of breast: Secondary | ICD-10-CM

## 2021-04-29 DIAGNOSIS — N6320 Unspecified lump in the left breast, unspecified quadrant: Secondary | ICD-10-CM

## 2021-04-29 DIAGNOSIS — N6322 Unspecified lump in the left breast, upper inner quadrant: Secondary | ICD-10-CM | POA: Diagnosis not present

## 2021-04-29 DIAGNOSIS — N6012 Diffuse cystic mastopathy of left breast: Secondary | ICD-10-CM | POA: Diagnosis not present

## 2021-05-02 ENCOUNTER — Other Ambulatory Visit: Payer: 59

## 2021-06-19 ENCOUNTER — Other Ambulatory Visit: Payer: Self-pay | Admitting: Family Medicine

## 2021-06-20 ENCOUNTER — Other Ambulatory Visit (HOSPITAL_COMMUNITY): Payer: Self-pay

## 2021-06-20 MED ORDER — CYANOCOBALAMIN 1000 MCG/ML IJ SOLN
INTRAMUSCULAR | 11 refills | Status: DC
  Filled 2021-06-20: qty 10, 70d supply, fill #0
  Filled 2021-10-07: qty 10, 70d supply, fill #1
  Filled 2022-01-01: qty 10, 70d supply, fill #2
  Filled 2022-04-07 – 2022-05-02 (×3): qty 10, 70d supply, fill #3

## 2021-06-20 MED ORDER — FERROUS SULFATE 325 (65 FE) MG PO TBEC
1.0000 | DELAYED_RELEASE_TABLET | Freq: Two times a day (BID) | ORAL | 3 refills | Status: DC
  Filled 2021-06-20: qty 200, 90d supply, fill #0
  Filled 2021-06-24 – 2022-01-30 (×3): qty 200, 100d supply, fill #0
  Filled 2022-02-19: qty 200, 90d supply, fill #0

## 2021-06-20 MED ORDER — PHENTERMINE HCL 37.5 MG PO CAPS
ORAL_CAPSULE | ORAL | 5 refills | Status: DC
  Filled 2021-06-20: qty 30, 30d supply, fill #0
  Filled 2021-10-14: qty 30, 30d supply, fill #1
  Filled 2021-11-25 – 2021-12-04 (×2): qty 30, 30d supply, fill #2

## 2021-06-20 NOTE — Telephone Encounter (Signed)
Last OV-10/15/2020 ?Last labs- 10/15/2020 ? ?No future OV scheduled. ?

## 2021-06-24 ENCOUNTER — Other Ambulatory Visit (HOSPITAL_COMMUNITY): Payer: Self-pay

## 2021-06-27 ENCOUNTER — Ambulatory Visit: Payer: 59 | Admitting: Family Medicine

## 2021-06-27 ENCOUNTER — Other Ambulatory Visit (HOSPITAL_COMMUNITY): Payer: Self-pay

## 2021-06-27 VITALS — BP 130/80 | HR 88 | Temp 99.3°F | Wt 296.0 lb

## 2021-06-27 DIAGNOSIS — D509 Iron deficiency anemia, unspecified: Secondary | ICD-10-CM

## 2021-06-27 DIAGNOSIS — E039 Hypothyroidism, unspecified: Secondary | ICD-10-CM | POA: Diagnosis not present

## 2021-06-27 DIAGNOSIS — M25542 Pain in joints of left hand: Secondary | ICD-10-CM

## 2021-06-27 DIAGNOSIS — R7303 Prediabetes: Secondary | ICD-10-CM | POA: Diagnosis not present

## 2021-06-27 DIAGNOSIS — I10 Essential (primary) hypertension: Secondary | ICD-10-CM

## 2021-06-27 DIAGNOSIS — M25541 Pain in joints of right hand: Secondary | ICD-10-CM | POA: Diagnosis not present

## 2021-06-27 DIAGNOSIS — E538 Deficiency of other specified B group vitamins: Secondary | ICD-10-CM | POA: Diagnosis not present

## 2021-06-27 LAB — CBC WITH DIFFERENTIAL/PLATELET
Basophils Absolute: 0.1 10*3/uL (ref 0.0–0.1)
Basophils Relative: 0.8 % (ref 0.0–3.0)
Eosinophils Absolute: 0.1 10*3/uL (ref 0.0–0.7)
Eosinophils Relative: 1.6 % (ref 0.0–5.0)
HCT: 34.6 % — ABNORMAL LOW (ref 36.0–46.0)
Hemoglobin: 11.1 g/dL — ABNORMAL LOW (ref 12.0–15.0)
Lymphocytes Relative: 18.9 % (ref 12.0–46.0)
Lymphs Abs: 1.7 10*3/uL (ref 0.7–4.0)
MCHC: 32.1 g/dL (ref 30.0–36.0)
MCV: 78.4 fl (ref 78.0–100.0)
Monocytes Absolute: 0.8 10*3/uL (ref 0.1–1.0)
Monocytes Relative: 8.9 % (ref 3.0–12.0)
Neutro Abs: 6.2 10*3/uL (ref 1.4–7.7)
Neutrophils Relative %: 69.8 % (ref 43.0–77.0)
Platelets: 308 10*3/uL (ref 150.0–400.0)
RBC: 4.41 Mil/uL (ref 3.87–5.11)
RDW: 15.7 % — ABNORMAL HIGH (ref 11.5–15.5)
WBC: 8.9 10*3/uL (ref 4.0–10.5)

## 2021-06-27 LAB — VITAMIN B12: Vitamin B-12: 1294 pg/mL — ABNORMAL HIGH (ref 211–911)

## 2021-06-27 LAB — IBC + FERRITIN
Ferritin: 9 ng/mL — ABNORMAL LOW (ref 10.0–291.0)
Iron: 16 ug/dL — ABNORMAL LOW (ref 42–145)
Saturation Ratios: 3 % — ABNORMAL LOW (ref 20.0–50.0)
TIBC: 530.6 ug/dL — ABNORMAL HIGH (ref 250.0–450.0)
Transferrin: 379 mg/dL — ABNORMAL HIGH (ref 212.0–360.0)

## 2021-06-27 LAB — TSH: TSH: 3.42 u[IU]/mL (ref 0.35–5.50)

## 2021-06-27 LAB — T3, FREE: T3, Free: 3.5 pg/mL (ref 2.3–4.2)

## 2021-06-27 LAB — T4, FREE: Free T4: 1.05 ng/dL (ref 0.60–1.60)

## 2021-06-27 LAB — SEDIMENTATION RATE: Sed Rate: 56 mm/hr — ABNORMAL HIGH (ref 0–20)

## 2021-06-27 LAB — C-REACTIVE PROTEIN: CRP: <1 mg/dL (ref 0.5–20.0)

## 2021-06-27 LAB — HEMOGLOBIN A1C: Hgb A1c MFr Bld: 6.4 % (ref 4.6–6.5)

## 2021-06-27 MED ORDER — TIRZEPATIDE 2.5 MG/0.5ML ~~LOC~~ SOAJ
2.5000 mg | SUBCUTANEOUS | 5 refills | Status: DC
  Filled 2021-06-27 – 2021-07-13 (×4): qty 2, 28d supply, fill #0

## 2021-06-27 NOTE — Progress Notes (Signed)
? ?  Subjective:  ? ? Patient ID: Lisa Horne, female    DOB: Apr 12, 1976, 46 y.o.   MRN: [id number] ? ?HPI ?Here for several issues. First she has been very fatigued the past few months and she wonders if her anemia has gotten worse. Her weight has been stable, but she is trying hard to lose weight to no avail. She asks if she can try one of the diabetic medications to help with weight loss. She has prediabetes, and her last A1c in June was 6.4. Lastly for a few  months she has had achy pains in the MTP joints on both hands. No swelling or warmth. She takes Ibuprofen to make this manageable.  ? ? ?Review of Systems  ?Constitutional:  Positive for fatigue. Negative for unexpected weight change.  ?Respiratory: Negative.    ?Cardiovascular: Negative.   ?Gastrointestinal: Negative.   ?Genitourinary: Negative.   ?Musculoskeletal:  Positive for arthralgias.  ? ?   ?Objective:  ? Physical Exam ?Constitutional:   ?   General: She is not in acute distress. ?   Appearance: She is obese.  ?Cardiovascular:  ?   Rate and Rhythm: Normal rate and regular rhythm.  ?   Pulses: Normal pulses.  ?   Heart sounds: Normal heart sounds.  ?Pulmonary:  ?   Effort: Pulmonary effort is normal.  ?   Breath sounds: Normal breath sounds.  ?Musculoskeletal:  ?   Comments: Both hands appear normal, no swelling or erythema or warmth. She is mildly tender over all 5 MTP joints in both hands. The DIPs and PIPs are normal. The wrists are normal.   ?Neurological:  ?   Mental Status: She is alert.  ? ? ? ? ? ?   ?Assessment & Plan:  ?Her HTN is well controlled. Her fatigue could stem from many issues, but hypothyroidism and iron deficiency anemia are at the top of the list. We will check labs for these things today. She may have an inflammatory arthritis, so we will check labs got this. As for weight management, we will try her on Mounjaro 2.5 mg weekly. Recheck in one month.We spent a total of ( 33  ) minutes reviewing records and discussing these  issues.  ?Gershon Crane, MD ? ? ?

## 2021-06-28 ENCOUNTER — Encounter: Payer: Self-pay | Admitting: Family Medicine

## 2021-06-28 ENCOUNTER — Other Ambulatory Visit (HOSPITAL_COMMUNITY): Payer: Self-pay

## 2021-06-30 LAB — ANA: Anti Nuclear Antibody (ANA): POSITIVE — AB

## 2021-06-30 LAB — ANTI-NUCLEAR AB-TITER (ANA TITER): ANA Titer 1: 1:320 {titer} — ABNORMAL HIGH

## 2021-06-30 LAB — RHEUMATOID FACTOR: Rheumatoid fact SerPl-aCnc: <14 IU/mL (ref <14)

## 2021-07-02 ENCOUNTER — Other Ambulatory Visit (HOSPITAL_COMMUNITY): Payer: Self-pay

## 2021-07-02 NOTE — Addendum Note (Signed)
Addended by: Gershon Crane A on: 07/02/2021 12:53 PM ? ? Modules accepted: Orders ? ?

## 2021-07-03 ENCOUNTER — Other Ambulatory Visit (HOSPITAL_COMMUNITY): Payer: Self-pay

## 2021-07-04 DIAGNOSIS — K219 Gastro-esophageal reflux disease without esophagitis: Secondary | ICD-10-CM | POA: Diagnosis not present

## 2021-07-04 NOTE — Telephone Encounter (Signed)
I initiated a PA for the pt's Mounjaro 2.5mg  and the PA has been sent to the pt's insurance for approval or denial. Once a decision is made the pt will be notified. Key: [id number] ?

## 2021-07-06 ENCOUNTER — Other Ambulatory Visit (HOSPITAL_COMMUNITY): Payer: Self-pay

## 2021-07-13 ENCOUNTER — Other Ambulatory Visit (HOSPITAL_COMMUNITY): Payer: Self-pay

## 2021-07-15 ENCOUNTER — Other Ambulatory Visit (HOSPITAL_COMMUNITY): Payer: Self-pay

## 2021-07-22 ENCOUNTER — Other Ambulatory Visit (HOSPITAL_COMMUNITY): Payer: Self-pay

## 2021-07-24 ENCOUNTER — Ambulatory Visit: Payer: 59 | Admitting: Family Medicine

## 2021-08-13 ENCOUNTER — Other Ambulatory Visit (HOSPITAL_COMMUNITY): Payer: Self-pay

## 2021-08-13 ENCOUNTER — Encounter: Payer: Self-pay | Admitting: Family Medicine

## 2021-08-13 ENCOUNTER — Ambulatory Visit: Payer: 59 | Admitting: Family Medicine

## 2021-08-13 VITALS — BP 116/80 | HR 70 | Temp 98.4°F | Wt 286.0 lb

## 2021-08-13 DIAGNOSIS — R7303 Prediabetes: Secondary | ICD-10-CM

## 2021-08-13 DIAGNOSIS — Z6841 Body Mass Index (BMI) 40.0 and over, adult: Secondary | ICD-10-CM

## 2021-08-13 MED ORDER — TIRZEPATIDE 5 MG/0.5ML ~~LOC~~ SOAJ
5.0000 mg | SUBCUTANEOUS | 3 refills | Status: DC
  Filled 2021-08-13: qty 6, 84d supply, fill #0

## 2021-08-13 NOTE — Progress Notes (Signed)
? ?  Subjective:  ? ? Patient ID: Lisa Horne, female    DOB: Aug 22, 1975, 46 y.o.   MRN: [id number] ? ?HPI ?Here to follow up on using Mounjaro. She has been injecting 2.5 mg weekly, and she feels good. Her random glucoses at home have been averaging 70-90. Her weight is down 10 lbs from the last visit.  ? ? ?Review of Systems  ?Constitutional: Negative.   ?Respiratory: Negative.    ?Cardiovascular: Negative.   ? ?   ?Objective:  ? Physical Exam ?Constitutional:   ?   Appearance: Normal appearance.  ?Cardiovascular:  ?   Rate and Rhythm: Normal rate and regular rhythm.  ?   Pulses: Normal pulses.  ?   Heart sounds: Normal heart sounds.  ?Pulmonary:  ?   Effort: Pulmonary effort is normal.  ?   Breath sounds: Normal breath sounds.  ?Neurological:  ?   Mental Status: She is alert.  ? ? ? ? ? ?   ?Assessment & Plan:  ?Obesity with prediabetes. We will increase the Mounjaro dose to 5 mg weekly. Recheck in 3 months. ?Gershon Crane, MD ? ? ?

## 2021-08-19 ENCOUNTER — Ambulatory Visit: Payer: 59 | Admitting: Family Medicine

## 2021-09-02 ENCOUNTER — Encounter: Payer: Self-pay | Admitting: Family Medicine

## 2021-09-03 ENCOUNTER — Other Ambulatory Visit (HOSPITAL_COMMUNITY): Payer: Self-pay

## 2021-09-03 MED ORDER — TIRZEPATIDE 7.5 MG/0.5ML ~~LOC~~ SOAJ
7.5000 mg | SUBCUTANEOUS | 3 refills | Status: DC
  Filled 2021-09-03: qty 6, 84d supply, fill #0
  Filled 2021-11-03 – 2021-12-04 (×3): qty 6, 84d supply, fill #1

## 2021-09-03 NOTE — Telephone Encounter (Signed)
Done

## 2021-09-04 ENCOUNTER — Other Ambulatory Visit (HOSPITAL_COMMUNITY): Payer: Self-pay

## 2021-10-08 ENCOUNTER — Other Ambulatory Visit (HOSPITAL_COMMUNITY): Payer: Self-pay

## 2021-10-14 ENCOUNTER — Other Ambulatory Visit (HOSPITAL_COMMUNITY): Payer: Self-pay

## 2021-10-16 ENCOUNTER — Other Ambulatory Visit (HOSPITAL_COMMUNITY): Payer: Self-pay

## 2021-10-17 ENCOUNTER — Other Ambulatory Visit (HOSPITAL_COMMUNITY): Payer: Self-pay

## 2021-11-01 DIAGNOSIS — R768 Other specified abnormal immunological findings in serum: Secondary | ICD-10-CM | POA: Diagnosis not present

## 2021-11-01 DIAGNOSIS — M79641 Pain in right hand: Secondary | ICD-10-CM | POA: Diagnosis not present

## 2021-11-01 DIAGNOSIS — M545 Low back pain, unspecified: Secondary | ICD-10-CM | POA: Diagnosis not present

## 2021-11-01 DIAGNOSIS — M1991 Primary osteoarthritis, unspecified site: Secondary | ICD-10-CM | POA: Diagnosis not present

## 2021-11-01 DIAGNOSIS — R5382 Chronic fatigue, unspecified: Secondary | ICD-10-CM | POA: Diagnosis not present

## 2021-11-01 DIAGNOSIS — E669 Obesity, unspecified: Secondary | ICD-10-CM | POA: Diagnosis not present

## 2021-11-01 DIAGNOSIS — Z6838 Body mass index (BMI) 38.0-38.9, adult: Secondary | ICD-10-CM | POA: Diagnosis not present

## 2021-11-01 DIAGNOSIS — M79642 Pain in left hand: Secondary | ICD-10-CM | POA: Diagnosis not present

## 2021-11-04 ENCOUNTER — Other Ambulatory Visit (HOSPITAL_COMMUNITY): Payer: Self-pay

## 2021-11-11 ENCOUNTER — Other Ambulatory Visit (HOSPITAL_COMMUNITY): Payer: Self-pay

## 2021-11-21 ENCOUNTER — Other Ambulatory Visit (HOSPITAL_COMMUNITY): Payer: Self-pay

## 2021-11-25 ENCOUNTER — Other Ambulatory Visit (HOSPITAL_COMMUNITY): Payer: Self-pay

## 2021-11-25 ENCOUNTER — Other Ambulatory Visit: Payer: Self-pay | Admitting: Family Medicine

## 2021-11-25 MED ORDER — FAMOTIDINE 40 MG PO TABS
40.0000 mg | ORAL_TABLET | Freq: Two times a day (BID) | ORAL | 0 refills | Status: DC
Start: 2021-11-25 — End: 2022-03-06
  Filled 2021-11-25 – 2021-12-05 (×2): qty 180, 90d supply, fill #0

## 2021-11-29 DIAGNOSIS — R768 Other specified abnormal immunological findings in serum: Secondary | ICD-10-CM | POA: Diagnosis not present

## 2021-11-29 DIAGNOSIS — E669 Obesity, unspecified: Secondary | ICD-10-CM | POA: Diagnosis not present

## 2021-11-29 DIAGNOSIS — M1991 Primary osteoarthritis, unspecified site: Secondary | ICD-10-CM | POA: Diagnosis not present

## 2021-11-29 DIAGNOSIS — M545 Low back pain, unspecified: Secondary | ICD-10-CM | POA: Diagnosis not present

## 2021-11-29 DIAGNOSIS — M79641 Pain in right hand: Secondary | ICD-10-CM | POA: Diagnosis not present

## 2021-11-29 DIAGNOSIS — M79642 Pain in left hand: Secondary | ICD-10-CM | POA: Diagnosis not present

## 2021-11-29 DIAGNOSIS — Z6837 Body mass index (BMI) 37.0-37.9, adult: Secondary | ICD-10-CM | POA: Diagnosis not present

## 2021-12-03 ENCOUNTER — Other Ambulatory Visit (HOSPITAL_COMMUNITY): Payer: Self-pay

## 2021-12-04 ENCOUNTER — Other Ambulatory Visit (HOSPITAL_COMMUNITY): Payer: Self-pay

## 2021-12-05 ENCOUNTER — Other Ambulatory Visit (HOSPITAL_COMMUNITY): Payer: Self-pay

## 2022-01-01 ENCOUNTER — Other Ambulatory Visit (HOSPITAL_COMMUNITY): Payer: Self-pay

## 2022-01-01 ENCOUNTER — Encounter: Payer: Self-pay | Admitting: Family Medicine

## 2022-01-01 ENCOUNTER — Other Ambulatory Visit: Payer: Self-pay | Admitting: Family Medicine

## 2022-01-01 MED ORDER — PHENTERMINE HCL 37.5 MG PO CAPS
ORAL_CAPSULE | ORAL | 5 refills | Status: DC
  Filled 2022-01-01: qty 30, 30d supply, fill #0
  Filled 2022-01-30 – 2022-02-19 (×2): qty 30, 30d supply, fill #1
  Filled 2022-04-07 – 2022-05-02 (×3): qty 30, 30d supply, fill #2
  Filled 2022-05-26 – 2022-06-10 (×2): qty 30, 30d supply, fill #3

## 2022-01-01 NOTE — Telephone Encounter (Signed)
Last refill-06/20/21--30 capsules, 5 refills Last OV- 08/13/21  Next OV- 02/06/22

## 2022-01-02 ENCOUNTER — Other Ambulatory Visit (HOSPITAL_COMMUNITY): Payer: Self-pay

## 2022-01-02 MED ORDER — IBUPROFEN 800 MG PO TABS
800.0000 mg | ORAL_TABLET | Freq: Three times a day (TID) | ORAL | 0 refills | Status: DC | PRN
  Filled 2022-01-02: qty 90, 30d supply, fill #0

## 2022-01-03 ENCOUNTER — Other Ambulatory Visit (HOSPITAL_COMMUNITY): Payer: Self-pay

## 2022-01-03 ENCOUNTER — Encounter: Payer: Self-pay | Admitting: Family Medicine

## 2022-01-03 MED ORDER — TIRZEPATIDE 10 MG/0.5ML ~~LOC~~ SOAJ
10.0000 mg | SUBCUTANEOUS | 5 refills | Status: DC
  Filled 2022-01-03 (×2): qty 6, 84d supply, fill #0
  Filled 2022-01-04: qty 2, 28d supply, fill #0
  Filled 2022-01-05 – 2022-01-06 (×2): qty 6, 84d supply, fill #0
  Filled 2022-01-07: qty 2, 28d supply, fill #0
  Filled 2022-01-09 – 2022-01-10 (×2): qty 6, 84d supply, fill #0
  Filled 2022-01-15: qty 2, 28d supply, fill #0
  Filled 2022-01-30 – 2022-02-04 (×2): qty 6, 84d supply, fill #0
  Filled 2022-02-12: qty 2, 28d supply, fill #0

## 2022-01-03 NOTE — Telephone Encounter (Signed)
Done

## 2022-01-03 NOTE — Telephone Encounter (Signed)
I sent this in this morning  ?

## 2022-01-04 ENCOUNTER — Other Ambulatory Visit (HOSPITAL_COMMUNITY): Payer: Self-pay

## 2022-01-05 ENCOUNTER — Other Ambulatory Visit (HOSPITAL_COMMUNITY): Payer: Self-pay

## 2022-01-06 ENCOUNTER — Other Ambulatory Visit (HOSPITAL_COMMUNITY): Payer: Self-pay

## 2022-01-07 ENCOUNTER — Other Ambulatory Visit (HOSPITAL_COMMUNITY): Payer: Self-pay

## 2022-01-10 ENCOUNTER — Other Ambulatory Visit (HOSPITAL_COMMUNITY): Payer: Self-pay

## 2022-01-13 ENCOUNTER — Encounter: Payer: Self-pay | Admitting: Family Medicine

## 2022-01-13 ENCOUNTER — Other Ambulatory Visit (HOSPITAL_COMMUNITY): Payer: Self-pay

## 2022-01-13 ENCOUNTER — Ambulatory Visit: Payer: 59 | Admitting: Family Medicine

## 2022-01-13 VITALS — BP 110/76 | HR 90 | Temp 98.0°F | Wt 239.0 lb

## 2022-01-13 DIAGNOSIS — B356 Tinea cruris: Secondary | ICD-10-CM

## 2022-01-13 DIAGNOSIS — L509 Urticaria, unspecified: Secondary | ICD-10-CM | POA: Diagnosis not present

## 2022-01-13 MED ORDER — TRIAMCINOLONE ACETONIDE 0.1 % EX CREA
1.0000 | TOPICAL_CREAM | Freq: Two times a day (BID) | CUTANEOUS | 2 refills | Status: DC
  Filled 2022-01-13: qty 45, 23d supply, fill #0
  Filled 2022-01-15 (×2): qty 45, 23d supply, fill #1

## 2022-01-13 MED ORDER — FLUCONAZOLE 150 MG PO TABS
150.0000 mg | ORAL_TABLET | Freq: Every day | ORAL | 0 refills | Status: DC
  Filled 2022-01-13: qty 29, 29d supply, fill #0
  Filled 2022-01-13: qty 1, 1d supply, fill #0

## 2022-01-13 MED ORDER — KETOCONAZOLE 2 % EX CREA
1.0000 | TOPICAL_CREAM | Freq: Two times a day (BID) | CUTANEOUS | 2 refills | Status: DC | PRN
  Filled 2022-01-13: qty 60, 30d supply, fill #0
  Filled 2022-01-15 (×2): qty 60, 30d supply, fill #1

## 2022-01-13 NOTE — Progress Notes (Signed)
   Subjective:    Patient ID: Lisa Horne, female    DOB: 10-Mar-1976, 46 y.o.   MRN: [id number]  HPI Here for 4 days of an itchy burning rash in the left groin area, as well as itchy rashes on her neck, chest, and arms. Using Benadryl, hydrocortisone cream, Calamine, with no relief.    Review of Systems  Constitutional: Negative.   Respiratory: Negative.    Cardiovascular: Negative.   Skin:  Positive for rash.       Objective:   Physical Exam Constitutional:      Appearance: Normal appearance.  Cardiovascular:     Rate and Rhythm: Normal rate and regular rhythm.     Pulses: Normal pulses.     Heart sounds: Normal heart sounds.  Pulmonary:     Effort: Pulmonary effort is normal.     Breath sounds: Normal breath sounds.  Skin:    Comments: There is a large area of macular erythema in the left groin. There are also patches of urticarial rash on the other areas   Neurological:     Mental Status: She is alert.           Assessment & Plan:  She has a Candidal rash and what appears to be a hives reaction to it. Treat the yeast with Diflucan pills and Ketoconazole cream. Treat the hives areas with oral Benadryl and Triamcinolone cream.  Alysia Penna, MD

## 2022-01-14 ENCOUNTER — Other Ambulatory Visit (HOSPITAL_COMMUNITY): Payer: Self-pay

## 2022-01-15 ENCOUNTER — Encounter: Payer: Self-pay | Admitting: Family Medicine

## 2022-01-15 ENCOUNTER — Ambulatory Visit: Payer: 59 | Admitting: Family Medicine

## 2022-01-15 ENCOUNTER — Other Ambulatory Visit (HOSPITAL_COMMUNITY): Payer: Self-pay

## 2022-01-15 VITALS — BP 112/78 | HR 95 | Temp 98.4°F | Wt 235.0 lb

## 2022-01-15 DIAGNOSIS — L509 Urticaria, unspecified: Secondary | ICD-10-CM | POA: Diagnosis not present

## 2022-01-15 DIAGNOSIS — B356 Tinea cruris: Secondary | ICD-10-CM | POA: Diagnosis not present

## 2022-01-15 MED ORDER — METHYLPREDNISOLONE ACETATE 80 MG/ML IJ SUSP
80.0000 mg | Freq: Once | INTRAMUSCULAR | Status: AC
  Administered 2022-01-15: 80 mg via INTRAMUSCULAR

## 2022-01-15 MED ORDER — PREDNISONE 10 MG PO TABS
ORAL_TABLET | ORAL | 0 refills | Status: DC
  Filled 2022-01-15: qty 65, 18d supply, fill #0

## 2022-01-15 NOTE — Progress Notes (Signed)
   Subjective:    Patient ID: Lisa Horne, female    DOB: May 27, 1975, 46 y.o.   MRN: [id number]  HPI Here to follow up on a rash that appeared about 7 days ago. We saw her on 01-13-22 for this, and we felt she had a tinea cruris along with a pruritis reaction.  She has been applying Ketoconazole cream and taking Diflucan, and this has started to improve. She has been treating the rash with oral Benadryl 50 mg every 4 hours and applying Triamcinolone cream. Unfortunately the rash has not improved at all. She itches all over her body. No tongue swelling or SOB. She is also taking Pepcid 40 mg BID.   Review of Systems  Constitutional: Negative.   Respiratory: Negative.    Cardiovascular: Negative.   Skin:  Positive for rash.       Objective:   Physical Exam Constitutional:      General: She is not in acute distress.    Appearance: Normal appearance.  Cardiovascular:     Rate and Rhythm: Normal rate and regular rhythm.     Pulses: Normal pulses.     Heart sounds: Normal heart sounds.  Pulmonary:     Effort: Pulmonary effort is normal.     Breath sounds: Normal breath sounds.  Skin:    Comments: The area of macular erythema in the left groin does look better today, the redness is lees intense. The urticarial rash over the rest of her body is unchanged   Neurological:     Mental Status: She is alert.           Assessment & Plan:  She will continue to treat the tinea as above. For the hives, we will give her 160 mg of DepoMedrol today and tomorrow she will begin an 18 day taper of Prednisone by taking 60 mg a day. Recheck as needed.  Alysia Penna, MD

## 2022-01-15 NOTE — Addendum Note (Signed)
Addended by: Wyvonne Lenz on: 01/15/2022 04:28 PM   Modules accepted: Orders

## 2022-01-16 ENCOUNTER — Other Ambulatory Visit (HOSPITAL_COMMUNITY): Payer: Self-pay

## 2022-01-30 ENCOUNTER — Other Ambulatory Visit: Payer: Self-pay | Admitting: Family Medicine

## 2022-01-30 DIAGNOSIS — E039 Hypothyroidism, unspecified: Secondary | ICD-10-CM

## 2022-01-31 ENCOUNTER — Other Ambulatory Visit (HOSPITAL_COMMUNITY): Payer: Self-pay

## 2022-01-31 MED ORDER — LEVOTHYROXINE SODIUM 175 MCG PO TABS
175.0000 ug | ORAL_TABLET | Freq: Every day | ORAL | 3 refills | Status: DC
  Filled 2022-01-31 – 2022-02-19 (×2): qty 90, 90d supply, fill #0
  Filled 2022-04-30: qty 90, 90d supply, fill #1
  Filled 2022-09-14: qty 90, 90d supply, fill #2
  Filled 2022-12-01: qty 90, 90d supply, fill #3

## 2022-02-01 ENCOUNTER — Other Ambulatory Visit (HOSPITAL_COMMUNITY): Payer: Self-pay

## 2022-02-04 ENCOUNTER — Other Ambulatory Visit (HOSPITAL_COMMUNITY): Payer: Self-pay

## 2022-02-06 ENCOUNTER — Ambulatory Visit (INDEPENDENT_AMBULATORY_CARE_PROVIDER_SITE_OTHER): Payer: 59 | Admitting: Family Medicine

## 2022-02-06 ENCOUNTER — Encounter: Payer: Self-pay | Admitting: Family Medicine

## 2022-02-06 VITALS — BP 118/76 | HR 70 | Temp 98.2°F | Ht 68.0 in | Wt 239.0 lb

## 2022-02-06 DIAGNOSIS — E538 Deficiency of other specified B group vitamins: Secondary | ICD-10-CM | POA: Diagnosis not present

## 2022-02-06 DIAGNOSIS — Z0000 Encounter for general adult medical examination without abnormal findings: Secondary | ICD-10-CM | POA: Diagnosis not present

## 2022-02-06 DIAGNOSIS — E039 Hypothyroidism, unspecified: Secondary | ICD-10-CM

## 2022-02-06 NOTE — Progress Notes (Signed)
   Subjective:    Patient ID: Lisa Horne, female    DOB: Jul 24, 1975, 46 y.o.   MRN: [id number]  HPI Here for a well exam. She feels fine.    Review of Systems  Constitutional: Negative.   HENT: Negative.    Eyes: Negative.   Respiratory: Negative.    Cardiovascular: Negative.   Gastrointestinal: Negative.   Genitourinary:  Negative for decreased urine volume, difficulty urinating, dyspareunia, dysuria, enuresis, flank pain, frequency, hematuria, pelvic pain and urgency.  Musculoskeletal: Negative.   Skin: Negative.   Neurological: Negative.  Negative for headaches.  Psychiatric/Behavioral: Negative.         Objective:   Physical Exam Constitutional:      General: She is not in acute distress.    Appearance: Normal appearance. She is well-developed.  HENT:     Head: Normocephalic and atraumatic.     Right Ear: External ear normal.     Left Ear: External ear normal.     Nose: Nose normal.     Mouth/Throat:     Pharynx: No oropharyngeal exudate.  Eyes:     General: No scleral icterus.    Conjunctiva/sclera: Conjunctivae normal.     Pupils: Pupils are equal, round, and reactive to light.  Neck:     Thyroid: No thyromegaly.     Vascular: No JVD.  Cardiovascular:     Rate and Rhythm: Normal rate and regular rhythm.     Heart sounds: Normal heart sounds. No murmur heard.    No friction rub. No gallop.  Pulmonary:     Effort: Pulmonary effort is normal. No respiratory distress.     Breath sounds: Normal breath sounds. No wheezing or rales.  Chest:     Chest wall: No tenderness.  Abdominal:     General: Bowel sounds are normal. There is no distension.     Palpations: Abdomen is soft. There is no mass.     Tenderness: There is no abdominal tenderness. There is no guarding or rebound.  Musculoskeletal:        General: No tenderness. Normal range of motion.     Cervical back: Normal range of motion and neck supple.  Lymphadenopathy:     Cervical: No cervical  adenopathy.  Skin:    General: Skin is warm and dry.     Findings: No erythema or rash.  Neurological:     Mental Status: She is alert and oriented to person, place, and time.     Cranial Nerves: No cranial nerve deficit.     Motor: No abnormal muscle tone.     Coordination: Coordination normal.     Deep Tendon Reflexes: Reflexes are normal and symmetric. Reflexes normal.  Psychiatric:        Behavior: Behavior normal.        Thought Content: Thought content normal.        Judgment: Judgment normal.           Assessment & Plan:  Well exam. We discussed diet and exercise. Get fasting labs. Alysia Penna, MD

## 2022-02-12 ENCOUNTER — Other Ambulatory Visit (HOSPITAL_COMMUNITY): Payer: Self-pay

## 2022-02-15 ENCOUNTER — Other Ambulatory Visit (HOSPITAL_COMMUNITY): Payer: Self-pay

## 2022-02-19 ENCOUNTER — Other Ambulatory Visit (HOSPITAL_COMMUNITY): Payer: Self-pay

## 2022-02-20 ENCOUNTER — Encounter: Payer: Self-pay | Admitting: Family Medicine

## 2022-02-21 ENCOUNTER — Other Ambulatory Visit (HOSPITAL_COMMUNITY): Payer: Self-pay

## 2022-02-21 MED ORDER — TIRZEPATIDE 10 MG/0.5ML ~~LOC~~ SOAJ
10.0000 mg | SUBCUTANEOUS | 3 refills | Status: DC
  Filled 2022-02-21: qty 18, 252d supply, fill #0
  Filled 2022-03-06: qty 2, 28d supply, fill #0
  Filled 2022-04-22 – 2022-04-29 (×2): qty 2, 28d supply, fill #1

## 2022-02-21 NOTE — Telephone Encounter (Signed)
Done

## 2022-03-06 ENCOUNTER — Other Ambulatory Visit (HOSPITAL_COMMUNITY): Payer: Self-pay

## 2022-03-06 ENCOUNTER — Other Ambulatory Visit: Payer: Self-pay | Admitting: Family Medicine

## 2022-03-07 ENCOUNTER — Other Ambulatory Visit (HOSPITAL_COMMUNITY): Payer: Self-pay

## 2022-03-07 MED ORDER — FAMOTIDINE 40 MG PO TABS
40.0000 mg | ORAL_TABLET | Freq: Two times a day (BID) | ORAL | 0 refills | Status: DC
  Filled 2022-03-07: qty 180, 90d supply, fill #0

## 2022-03-10 ENCOUNTER — Other Ambulatory Visit (INDEPENDENT_AMBULATORY_CARE_PROVIDER_SITE_OTHER): Payer: 59

## 2022-03-10 DIAGNOSIS — Z0000 Encounter for general adult medical examination without abnormal findings: Secondary | ICD-10-CM | POA: Diagnosis not present

## 2022-03-10 DIAGNOSIS — E538 Deficiency of other specified B group vitamins: Secondary | ICD-10-CM | POA: Diagnosis not present

## 2022-03-10 DIAGNOSIS — E039 Hypothyroidism, unspecified: Secondary | ICD-10-CM

## 2022-03-10 LAB — CBC WITH DIFFERENTIAL/PLATELET
Basophils Absolute: 0.1 10*3/uL (ref 0.0–0.1)
Basophils Relative: 0.7 % (ref 0.0–3.0)
Eosinophils Absolute: 0.1 10*3/uL (ref 0.0–0.7)
Eosinophils Relative: 1.1 % (ref 0.0–5.0)
HCT: 43.7 % (ref 36.0–46.0)
Hemoglobin: 14.4 g/dL (ref 12.0–15.0)
Lymphocytes Relative: 20.1 % (ref 12.0–46.0)
Lymphs Abs: 1.7 10*3/uL (ref 0.7–4.0)
MCHC: 33 g/dL (ref 30.0–36.0)
MCV: 84.2 fl (ref 78.0–100.0)
Monocytes Absolute: 0.8 10*3/uL (ref 0.1–1.0)
Monocytes Relative: 9.8 % (ref 3.0–12.0)
Neutro Abs: 5.9 10*3/uL (ref 1.4–7.7)
Neutrophils Relative %: 68.3 % (ref 43.0–77.0)
Platelets: 299 10*3/uL (ref 150.0–400.0)
RBC: 5.19 Mil/uL — ABNORMAL HIGH (ref 3.87–5.11)
RDW: 15.2 % (ref 11.5–15.5)
WBC: 8.7 10*3/uL (ref 4.0–10.5)

## 2022-03-10 LAB — BASIC METABOLIC PANEL
BUN: 16 mg/dL (ref 6–23)
CO2: 24 mEq/L (ref 19–32)
Calcium: 9.4 mg/dL (ref 8.4–10.5)
Chloride: 102 mEq/L (ref 96–112)
Creatinine, Ser: 0.85 mg/dL (ref 0.40–1.20)
GFR: 80.77 mL/min (ref >60.00)
Glucose, Bld: 72 mg/dL (ref 70–99)
Potassium: 3.9 mEq/L (ref 3.5–5.1)
Sodium: 139 mEq/L (ref 135–145)

## 2022-03-10 LAB — T3, FREE: T3, Free: 3.6 pg/mL (ref 2.3–4.2)

## 2022-03-10 LAB — HEPATIC FUNCTION PANEL
ALT: 12 U/L (ref 0–35)
AST: 16 U/L (ref 0–37)
Albumin: 4.3 g/dL (ref 3.5–5.2)
Alkaline Phosphatase: 72 U/L (ref 39–117)
Bilirubin, Direct: 0.2 mg/dL (ref 0.0–0.3)
Total Bilirubin: 0.9 mg/dL (ref 0.2–1.2)
Total Protein: 7.6 g/dL (ref 6.0–8.3)

## 2022-03-10 LAB — LIPID PANEL
Cholesterol: 234 mg/dL — ABNORMAL HIGH (ref 0–200)
HDL: 48.4 mg/dL (ref >39.00)
LDL Cholesterol: 171 mg/dL — ABNORMAL HIGH (ref 0–99)
NonHDL: 185.81
Total CHOL/HDL Ratio: 5
Triglycerides: 73 mg/dL (ref 0.0–149.0)
VLDL: 14.6 mg/dL (ref 0.0–40.0)

## 2022-03-10 LAB — T4, FREE: Free T4: 1.02 ng/dL (ref 0.60–1.60)

## 2022-03-10 LAB — TSH: TSH: 4.23 u[IU]/mL (ref 0.35–5.50)

## 2022-03-10 LAB — VITAMIN B12: Vitamin B-12: 1045 pg/mL — ABNORMAL HIGH (ref 211–911)

## 2022-03-10 LAB — HEMOGLOBIN A1C: Hgb A1c MFr Bld: 5.8 % (ref 4.6–6.5)

## 2022-03-12 ENCOUNTER — Other Ambulatory Visit: Payer: Self-pay

## 2022-03-12 ENCOUNTER — Other Ambulatory Visit (HOSPITAL_COMMUNITY): Payer: Self-pay

## 2022-03-12 DIAGNOSIS — E785 Hyperlipidemia, unspecified: Secondary | ICD-10-CM

## 2022-03-12 MED ORDER — ATORVASTATIN CALCIUM 10 MG PO TABS
10.0000 mg | ORAL_TABLET | Freq: Every day | ORAL | 3 refills | Status: DC
  Filled 2022-03-12: qty 90, 90d supply, fill #0
  Filled 2022-04-30: qty 90, 90d supply, fill #1

## 2022-03-18 ENCOUNTER — Encounter: Payer: Self-pay | Admitting: Family Medicine

## 2022-03-18 ENCOUNTER — Ambulatory Visit: Payer: 59 | Admitting: Family Medicine

## 2022-03-18 VITALS — BP 104/72 | HR 78 | Temp 98.1°F | Wt 233.0 lb

## 2022-03-18 DIAGNOSIS — I10 Essential (primary) hypertension: Secondary | ICD-10-CM | POA: Diagnosis not present

## 2022-03-18 NOTE — Progress Notes (Signed)
   Subjective:    Patient ID: Lisa Horne, female    DOB: 04-19-1976, 46 y.o.   MRN: [id number]  HPI Here to check her BP and to fill out paperwork since she is applying for short term disability insurance. She has no complaints today.    Review of Systems  Constitutional: Negative.   Respiratory: Negative.    Cardiovascular: Negative.        Objective:   Physical Exam Constitutional:      Appearance: Normal appearance.  Cardiovascular:     Rate and Rhythm: Normal rate and regular rhythm.     Pulses: Normal pulses.     Heart sounds: Normal heart sounds.  Pulmonary:     Effort: Pulmonary effort is normal.     Breath sounds: Normal breath sounds.  Neurological:     Mental Status: She is alert.           Assessment & Plan:  Her HTN is well controlled. We filled out the paperwork as above.  Gershon Crane, MD

## 2022-03-28 ENCOUNTER — Other Ambulatory Visit (HOSPITAL_COMMUNITY): Payer: Self-pay

## 2022-04-07 ENCOUNTER — Other Ambulatory Visit: Payer: Self-pay

## 2022-04-15 ENCOUNTER — Other Ambulatory Visit (HOSPITAL_COMMUNITY): Payer: Self-pay

## 2022-04-22 ENCOUNTER — Encounter: Payer: Self-pay | Admitting: Obstetrics and Gynecology

## 2022-04-22 ENCOUNTER — Other Ambulatory Visit (HOSPITAL_COMMUNITY)
Admission: RE | Admit: 2022-04-22 | Discharge: 2022-04-22 | Disposition: A | Discharge status: Home / Self Care | Payer: Commercial Managed Care - PPO | Source: Ambulatory Visit | Attending: Family Medicine | Admitting: Family Medicine

## 2022-04-22 ENCOUNTER — Ambulatory Visit (INDEPENDENT_AMBULATORY_CARE_PROVIDER_SITE_OTHER): Payer: Commercial Managed Care - PPO | Admitting: Obstetrics and Gynecology

## 2022-04-22 VITALS — BP 132/83 | HR 80 | Ht 69.0 in | Wt 215.1 lb

## 2022-04-22 DIAGNOSIS — Z01419 Encounter for gynecological examination (general) (routine) without abnormal findings: Secondary | ICD-10-CM

## 2022-04-22 DIAGNOSIS — Z30433 Encounter for removal and reinsertion of intrauterine contraceptive device: Secondary | ICD-10-CM | POA: Diagnosis not present

## 2022-04-22 DIAGNOSIS — Z1231 Encounter for screening mammogram for malignant neoplasm of breast: Secondary | ICD-10-CM | POA: Diagnosis not present

## 2022-04-22 LAB — POCT PREGNANCY, URINE: Preg Test, Ur: NEGATIVE

## 2022-04-22 MED ORDER — LEVONORGESTREL 20 MCG/DAY IU IUD
1.0000 | INTRAUTERINE_SYSTEM | Freq: Once | INTRAUTERINE | Status: AC
  Administered 2022-04-22: 1 via INTRAUTERINE

## 2022-04-22 NOTE — Progress Notes (Signed)
GYNECOLOGY ANNUAL PREVENTATIVE CARE ENCOUNTER NOTE  History:     Lisa Horne is a 47 y.o. [id number] female here for a routine annual gynecologic exam.  Current complaints: desire for removal and replacement of IUD.   Denies abnormal vaginal bleeding, discharge, pelvic pain, problems with intercourse or other gynecologic concerns.    Gynecologic History No LMP recorded (lmp unknown). (Menstrual status: IUD). Contraception: IUD Last Pap: no result, was done at private practice office Last mammogram: 12/22 and 1/23. 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  Obstetric History OB History  Gravida Para Term Preterm AB Living  2 2 2     2   SAB IAB Ectopic Multiple Live Births          2    # Outcome Date GA Lbr Len/2nd Weight Sex Delivery Anes PTL Lv  2 Term 2009    M Vag-Spont   LIV  1 Term 2003    M Vag-Spont   LIV     Birth Comments: GDM    Past Medical History:  Diagnosis Date   Allergy    Anemia    GERD (gastroesophageal reflux disease)    Hyperlipemia    Morbid obesity (HCC)    PCOS (polycystic ovarian syndrome)    ON METFOMRIN   PONV (postoperative nausea and vomiting)    Prediabetes    Thyroid disease    hypothyroidism    Past Surgical History:  Procedure Laterality Date   COLONOSCOPY  03/11/2021   per Dr. Carlean Purl, diverticulosis, no polyps, repeat in 10 yrs   GASTRIC BANDING PORT REVISION     GASTRIC RESTRICTION SURGERY     lap band 2005   LAPAROSCOPIC GASTRIC SLEEVE RESECTION      Current Outpatient Medications on File Prior to Visit  Medication Sig Dispense Refill   Chlorpheniramine Maleate (ALLERGY PO) Take by mouth. BUYS ALLERGY MED ON SALE - VARIES BETWEEN CLARITIN, ZYRTEC, ALLEGRA     cyanocobalamin (DODEX) 1000 MCG/ML injection INJECT 1 ML (1,000 MCG TOTAL) INTO THE MUSCLE ONCE A WEEK. 10 mL 11   famotidine (PEPCID) 40 MG tablet Take 1 tablet (40 mg total) by mouth 2 (two) times daily. 180 tablet 0   ferrous sulfate 325 (65 FE) MG EC tablet  TAKE 1 TABLET BY MOUTH 2 TIMES DAILY 180 tablet 3   ibuprofen (ADVIL) 800 MG tablet Take 1 tablet (800 mg total) by mouth every 8 (eight) hours as needed. 90 tablet 0   levothyroxine (SYNTHROID) 175 MCG tablet Take 1 tablet (175 mcg total) by mouth daily. 90 tablet 3   Multiple Vitamins-Minerals (BARIATRIC MULTIVITAMINS/IRON) CAPS Take 1 capsule by mouth daily. 90 capsule 3   phentermine 37.5 MG capsule Take 1 capsule by mouth daily 30 capsule 5   tirzepatide (MOUNJARO) 10 MG/0.5ML Pen Inject 10 mg into the skin once a week. 18 mL 3   valACYclovir (VALTREX) 500 MG tablet Take 1 tablet (500 mg total) by mouth 2 (two) times daily. 60 tablet 5   atorvastatin (LIPITOR) 10 MG tablet Take 1 tablet (10 mg total) by mouth daily. (Patient not taking: Reported on 04/22/2022) 90 tablet 3   Continuous Blood Gluc Receiver (FREESTYLE LIBRE 14 DAY READER) DEVI USE AS DIRECTED (Patient not taking: Reported on 04/22/2022) 2 each 1   Continuous Blood Gluc Receiver (FREESTYLE LIBRE 2 READER) DEVI Use as directed (Patient not taking: Reported on 04/22/2022) 2 each 1   Continuous Blood Gluc Sensor (FREESTYLE LIBRE 2 SENSOR) MISC Use as directed (  Patient not taking: Reported on 04/22/2022) 2 each 6   cyclobenzaprine (FLEXERIL) 10 MG tablet Take 1 tablet (10 mg total) by mouth 3 (three) times daily as needed for muscle spasms. (Patient not taking: Reported on 04/22/2022) 270 tablet 3   No current facility-administered medications on file prior to visit.    Allergies  Allergen Reactions   Ciprofloxacin     REACTION: severe headache/vomiting   Hydrochlorothiazide W-Triamterene     REACTION: malaise    Social History:  reports that she has never smoked. She has never used smokeless tobacco. She reports that she does not drink alcohol and does not use drugs.  Family History  Problem Relation Age of Onset   Breast cancer Mother    Hypertension Mother    Cancer Mother        breast   Depression Mother    Diabetes Father     Cancer Father        skin   Colon polyps Sister    Colon cancer Sister    Depression Sister    Colon cancer Maternal Uncle    Esophageal cancer Neg Hx    Rectal cancer Neg Hx    Stomach cancer Neg Hx     The following portions of the patient's history were reviewed and updated as appropriate: allergies, current medications, past family history, past medical history, past social history, past surgical history and problem list.  Review of Systems Pertinent items noted in HPI and remainder of comprehensive ROS otherwise negative.  Physical Exam:  BP 132/83   Pulse 80   Ht 5\' 9"  (1.753 m)   Wt 215 lb 1.6 oz (97.6 kg)   LMP  (LMP Unknown) Comment: last menstrual period was before last pregnancy; does not get periods with IUD  BMI 31.76 kg/m  CONSTITUTIONAL: Well-developed, well-nourished female in no acute distress.  HENT:  Normocephalic, atraumatic, External right and left ear normal. Oropharynx is clear and moist EYES: Conjunctivae and EOM are normal.  NECK: Normal range of motion, supple, no masses.  Normal thyroid.  SKIN: Skin is warm and dry. No rash noted. Not diaphoretic. No erythema. No pallor. MUSCULOSKELETAL: Normal range of motion. No tenderness.  No cyanosis, clubbing, or edema.  2+ distal pulses. NEUROLOGIC: Alert and oriented to person, place, and time. Normal reflexes, muscle tone coordination.  PSYCHIATRIC: Normal mood and affect. Normal behavior. Normal judgment and thought content. CARDIOVASCULAR: Normal heart rate noted, regular rhythm RESPIRATORY: Clear to auscultation bilaterally. Effort and breath sounds normal, no problems with respiration noted. BREASTS: deferred per patient ABDOMEN: Soft, no distention noted.  No tenderness, rebound or guarding.  PELVIC: Normal appearing external genitalia and urethral meatus; normal appearing vaginal mucosa and cervix.  No abnormal discharge noted.  Pap smear obtained.  Pt declined STI exam. Normal uterine size, no other  palpable masses, no uterine or adnexal tenderness.  IUD strings easily seen prior to removal and replacement. Performed in the presence of a chaperone.   Assessment and Plan:    1. Women's annual routine gynecological examination Normal annual exam - Cytology - PAP( [facility])  2. Encounter for removal and reinsertion of IUD See separate note  - Pregnancy, urine POC - levonorgestrel (MIRENA) 20 MCG/DAY IUD 1 each  3. Breast cancer screening by mammogram Order placed, pt will self schedule  - MM Digital Screening; Future  Will follow up results of pap smear and manage accordingly. Mammogram scheduled Routine preventative health maintenance measures emphasized. Please refer to After  Visit Summary for other counseling recommendations.      Mariel Aloe, MD, FACOG Obstetrician & Gynecologist, [facility], [facility]

## 2022-04-22 NOTE — Patient Instructions (Signed)
[place] Phone: [phone]

## 2022-04-22 NOTE — Progress Notes (Signed)
    GYNECOLOGY OFFICE PROCEDURE NOTE  Lisa Horne is a 47 y.o. [phone] here for  IUD removal. No GYN concerns.  Last pap smear was on 04/22/22.   IUD Removal  Patient identified, informed consent performed, consent signed.  Patient was in the dorsal lithotomy position, normal external genitalia was noted.  A speculum was placed in the patient's vagina, normal discharge was noted, no lesions. The cervix was visualized, no lesions, no abnormal discharge.  The strings of the IUD were grasped and pulled using kelly forceps. The IUD was removed in its entirety. Patient tolerated the procedure well.    Patient will use new mirena IUD for contraception.  Routine preventative health maintenance measures emphasized.     GYNECOLOGY OFFICE PROCEDURE NOTE  Lisa Horne is a 47 y.o. [id number] here formirena IUD insertion. No GYN concerns.    IUD Insertion Procedure Note Patient identified, informed consent performed, consent signed.   Discussed risks of irregular bleeding, cramping, infection, malpositioning or misplacement of the IUD outside the uterus which may require further procedure such as laparoscopy. Also discussed >99% contraception efficacy, increased risk of ectopic pregnancy with failure of method.  Time out was performed.  Urine pregnancy test negative.  Speculum placed in the vagina.  Cervix visualized.  Cleaned with Betadine x 2.  Grasped anteriorly with a single tooth tenaculum.  Uterus sounded to 8 cm.  Mirena IUD placed per manufacturer's recommendations.  Strings trimmed to 3 cm. Tenaculum was removed, good hemostasis noted.  Patient tolerated procedure well.   Patient was given post-procedure instructions.  She was advised to have backup contraception for one week.  Patient was also asked to check IUD strings periodically and follow up in 4 weeks for IUD check.   Lynnda Shields, MD, [place] for [facility], [place]

## 2022-04-24 ENCOUNTER — Encounter: Payer: Self-pay | Admitting: Family Medicine

## 2022-04-24 ENCOUNTER — Other Ambulatory Visit (HOSPITAL_COMMUNITY): Payer: Self-pay

## 2022-04-25 ENCOUNTER — Other Ambulatory Visit: Payer: Self-pay

## 2022-04-25 ENCOUNTER — Other Ambulatory Visit (HOSPITAL_COMMUNITY): Payer: Self-pay

## 2022-04-25 LAB — CYTOLOGY - PAP
Comment: NEGATIVE
Diagnosis: NEGATIVE
High risk HPV: NEGATIVE

## 2022-04-28 ENCOUNTER — Other Ambulatory Visit: Payer: Self-pay

## 2022-04-28 ENCOUNTER — Other Ambulatory Visit (HOSPITAL_COMMUNITY): Payer: Self-pay

## 2022-04-28 NOTE — Telephone Encounter (Signed)
Pharmacy Patient Advocate Encounter   Received notification that prior authorization for Mounjaro 10MG /0.5ML pen-injectors is required/requested.   PA submitted on 04/28/2022 to (ins) [employer] via CoverMyMeds Key [id number]  Status is pending

## 2022-04-29 ENCOUNTER — Other Ambulatory Visit: Payer: Self-pay | Admitting: Family Medicine

## 2022-04-30 ENCOUNTER — Other Ambulatory Visit (HOSPITAL_COMMUNITY): Payer: Self-pay

## 2022-04-30 MED ORDER — FREESTYLE LIBRE 2 SENSOR MISC
1 refills | Status: DC
  Filled 2022-04-30: qty 2, 28d supply, fill #0
  Filled 2022-05-26: qty 2, 28d supply, fill #1

## 2022-05-01 ENCOUNTER — Other Ambulatory Visit (HOSPITAL_COMMUNITY): Payer: Self-pay

## 2022-05-01 MED ORDER — WEGOVY 0.25 MG/0.5ML ~~LOC~~ SOAJ
0.2500 mg | SUBCUTANEOUS | 0 refills | Status: DC
  Filled 2022-05-01 – 2022-05-16 (×4): qty 2, 28d supply, fill #0

## 2022-05-01 NOTE — Telephone Encounter (Signed)
Pharmacy Patient Advocate Encounter  Received notification from [employer] that the request for prior authorization for Mounjaro 10MG /0.5ML pen-injectors has been denied due to not meeting guideline rules.  Marland Kitchen    How would you like to proceed?  Please be advised appeals may take up to 5 business days to be submitted as pharmacist prepares necessary documentation.  Thank you!

## 2022-05-01 NOTE — Telephone Encounter (Signed)
I changed to [facility]

## 2022-05-02 ENCOUNTER — Other Ambulatory Visit: Payer: Self-pay

## 2022-05-02 ENCOUNTER — Other Ambulatory Visit (HOSPITAL_COMMUNITY): Payer: Self-pay

## 2022-05-02 NOTE — Telephone Encounter (Signed)
Pharmacy Patient Advocate Encounter   Received notification from [facility] that prior authorization for [facility] 0.25MG /0.5ML auto-injectors is required/requested.  PA submitted on 05/02/22 to (ins) [employer] via CoverMyMeds Key [id number] Status is pending

## 2022-05-06 NOTE — Telephone Encounter (Signed)
No, I don't think there is much point in further appeals. She nay want to try buying it herself using [facility] [employer]

## 2022-05-06 NOTE — Telephone Encounter (Signed)
See below messages.    Wegovy denied, should Prior authorization team proceed with appeal?      Please advise.

## 2022-05-06 NOTE — Telephone Encounter (Signed)
Pharmacy Patient Advocate Encounter  Received notification from [employer] that the request for prior authorization for [facility] 0.25MG /0.5ML auto-injectors has been denied due to not meeting guideline rules.      E-Appeal is available (Key: [id number])  Please be advised we currently do not have a Pharmacist to review denials. If you would like [place] to submit it on your behalf, please provide clinical information to support your reason for appeal and any pertinent information you would like [place] to include with the appeal request. Appeals may take longer 5 business days to be submitted as we prepares necessary documentation. Thanks for your support.  How would you like to proceed?

## 2022-05-07 NOTE — Telephone Encounter (Signed)
See message from Dr. Sarajane Jews.

## 2022-05-09 ENCOUNTER — Other Ambulatory Visit (HOSPITAL_COMMUNITY): Payer: Self-pay

## 2022-05-09 NOTE — Telephone Encounter (Signed)
It was sent to [facility], the Rx info is still [employer].

## 2022-05-10 ENCOUNTER — Ambulatory Visit (HOSPITAL_COMMUNITY): Payer: Commercial Managed Care - PPO

## 2022-05-10 ENCOUNTER — Ambulatory Visit
Admission: EM | Admit: 2022-05-10 | Discharge: 2022-05-10 | Disposition: A | Discharge status: Home / Self Care | Payer: Commercial Managed Care - PPO | Attending: Physician Assistant | Admitting: Physician Assistant

## 2022-05-10 DIAGNOSIS — R109 Unspecified abdominal pain: Secondary | ICD-10-CM | POA: Diagnosis not present

## 2022-05-10 LAB — POCT URINALYSIS DIP (MANUAL ENTRY)
Bilirubin, UA: NEGATIVE
Glucose, UA: NEGATIVE mg/dL
Ketones, POC UA: NEGATIVE mg/dL
Leukocytes, UA: NEGATIVE
Nitrite, UA: NEGATIVE
Protein Ur, POC: NEGATIVE mg/dL
Spec Grav, UA: 1.015 (ref 1.010–1.025)
Urobilinogen, UA: 0.2 E.U./dL
pH, UA: 5.5 (ref 5.0–8.0)

## 2022-05-10 MED ORDER — TAMSULOSIN HCL 0.4 MG PO CAPS: 0.4000 mg | ORAL_CAPSULE | Freq: Every day | ORAL | 0 refills | Status: AC

## 2022-05-10 NOTE — ED Triage Notes (Signed)
Pt presents with left side flank pain X 1 week.

## 2022-05-10 NOTE — ED Provider Notes (Signed)
[facility]    CSN: [id number] Arrival date & time: 05/10/22  3244      History   Chief Complaint Chief Complaint  Patient presents with   Flank Pain    HPI Lisa Horne is a 47 y.o. female.   Patient here today for evaluation of left-sided flank pain that started a week ago.  She reports that she has not had any urinary frequency or dysuria.  She denies any fever.  She has not had any pain with movement but does report some sharp pains at times in her flank area, and this is sometimes worsened when she takes a deep breath.  She has tried taking ibuprofen which was not significantly helpful.  She states that she did have some vomiting last night due to pain.  The history is provided by the patient.    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 Leone Payor, diverticulosis, no polyps, repeat in 10 yrs   GASTRIC BANDING PORT REVISION     GASTRIC RESTRICTION SURGERY     lap band 2005   LAPAROSCOPIC GASTRIC SLEEVE RESECTION      OB History     Gravida  2   Para  2   Term  2   Preterm      AB      Living  2      SAB       IAB      Ectopic      Multiple      Live Births  2            Home Medications    Prior to Admission medications   Medication Sig Start Date End Date Taking? Authorizing Provider  tamsulosin (FLOMAX) 0.4 MG CAPS capsule Take 1 capsule (0.4 mg total) by mouth daily for 10 days. 05/10/22 05/20/22 Yes Tomi Bamberger, PA-C  Chlorpheniramine Maleate (ALLERGY PO) Take by mouth. BUYS ALLERGY MED ON SALE - VARIES BETWEEN CLARITIN, ZYRTEC, ALLEGRA    [provider]  Continuous Blood Gluc Sensor (FREESTYLE LIBRE 2 SENSOR) MISC Apply sensor as directed every 14 days for continuous blood glucose monitoring. 04/30/22   Nelwyn Salisbury, MD  cyanocobalamin (DODEX) 1000 MCG/ML injection INJECT 1 ML (1,000 MCG TOTAL) INTO THE MUSCLE ONCE A WEEK. 06/20/21 07/11/22  Nelwyn Salisbury, MD  famotidine (PEPCID) 40 MG tablet Take 1 tablet (40 mg total) by mouth 2 (two) times daily. 03/07/22   Nelwyn Salisbury, MD  ferrous sulfate 325 (65 FE) MG EC tablet TAKE 1 TABLET BY MOUTH 2 TIMES DAILY  06/20/21 06/20/22  Laurey Morale, MD  ibuprofen (ADVIL) 800 MG tablet Take 1 tablet (800 mg total) by mouth every 8 (eight) hours as needed. 01/02/22   Laurey Morale, MD  levothyroxine (SYNTHROID) 175 MCG tablet Take 1 tablet (175 mcg total) by mouth daily. 01/31/22   Laurey Morale, MD  Multiple Vitamins-Minerals (BARIATRIC MULTIVITAMINS/IRON) CAPS Take 1 capsule by mouth daily. 02/25/19   Laurey Morale, MD  phentermine 37.5 MG capsule Take 1 capsule by mouth daily 01/01/22   Laurey Morale, MD  Semaglutide-Weight Management (WEGOVY) 0.25 MG/0.5ML SOAJ Inject 0.25 mg into the skin every 7 (seven) days. 05/01/22   Laurey Morale, MD  valACYclovir (VALTREX) 500 MG tablet Take 1 tablet (500 mg total) by mouth 2 (two) times daily. 10/15/20   Laurey Morale, MD    Family History Family History  Problem Relation Age of Onset   Breast cancer Mother    Hypertension Mother    Cancer Mother        breast   Depression  Mother    Diabetes Father    Cancer Father        skin   Colon polyps Sister    Colon cancer Sister    Depression Sister    Colon cancer Maternal Uncle    Esophageal cancer Neg Hx    Rectal cancer Neg Hx    Stomach cancer Neg Hx     Social History Social History   Tobacco Use   Smoking status: Never   Smokeless tobacco: Never  Vaping Use   Vaping Use: Never used  Substance Use Topics   Alcohol use: No    Alcohol/week: 0.0 standard drinks of alcohol   Drug use: No     Allergies   Ciprofloxacin and Hydrochlorothiazide w-triamterene   Review of Systems Review of Systems  Constitutional:  Negative for chills and fever.  Eyes:  Negative for discharge and redness.  Gastrointestinal:  Positive for vomiting. Negative for abdominal pain and nausea.  Genitourinary:  Positive for flank pain. Negative for dysuria.  Musculoskeletal:  Positive for back pain.     Physical Exam Triage Vital Signs ED Triage Vitals  Enc Vitals Group     BP      Pulse      Resp      Temp      Temp src      SpO2      Weight      Height      Head Circumference      Peak Flow      Pain Score      Pain Loc      Pain Edu?      Excl. in [place]?    No data found.  Updated Vital Signs BP 122/84 (BP Location: Left Arm)   Pulse 72   Temp 98.2 F (36.8 C) (Oral)   Resp 17   LMP  (LMP Unknown) Comment: last menstrual period was before last pregnancy; does not get periods with IUD  SpO2 97%      Physical Exam Vitals and nursing note reviewed.  Constitutional:      General: She is not in acute distress.    Appearance: Normal appearance. She is not ill-appearing.  HENT:     Head: Normocephalic and atraumatic.  Eyes:     Conjunctiva/sclera: Conjunctivae normal.  Cardiovascular:     Rate and Rhythm: Normal rate and regular rhythm.  Pulmonary:  Effort: Pulmonary effort is normal. No respiratory distress.     Breath sounds: Normal breath sounds. No wheezing, rhonchi or rales.   Abdominal:     Tenderness: There is no right CVA tenderness or left CVA tenderness.  Neurological:     Mental Status: She is alert.  Psychiatric:        Mood and Affect: Mood normal.        Behavior: Behavior normal.        Thought Content: Thought content normal.      UC Treatments / Results  Labs (all labs ordered are listed, but only abnormal results are displayed) Labs Reviewed  POCT URINALYSIS DIP (MANUAL ENTRY) - Abnormal; Notable for the following components:      Result Value   Color, UA light yellow (*)    Blood, UA trace-intact (*)    All other components within normal limits    EKG   Radiology No results found.  Procedures Procedures (including critical care time)  Medications Ordered in UC Medications - No data to display  Initial Impression / Assessment and Plan / UC Course  I have reviewed the triage vital signs and the nursing notes.  Pertinent labs & imaging results that were available during my care of the patient were reviewed by me and considered in my medical decision making (see chart for details).    Discussed possibility of kidney stone given trace of blood in urine.  Will trial tamsulosin.  Patient has no desire to go to the emergency room today for imaging, but recommended this should symptoms worsen or fail to improve with treatment.  Patient expresses understanding.  Final Clinical Impressions(s) / UC Diagnoses   Final diagnoses:  Flank pain   Discharge Instructions   None    ED Prescriptions     Medication Sig Dispense Auth. Provider   tamsulosin (FLOMAX) 0.4 MG CAPS capsule Take 1 capsule (0.4 mg total) by mouth daily for 10 days. 10 capsule Francene Finders, PA-C      PDMP not reviewed this encounter.   Francene Finders, PA-C 05/10/22 1012

## 2022-05-13 ENCOUNTER — Ambulatory Visit
Admission: RE | Admit: 2022-05-13 | Discharge: 2022-05-13 | Disposition: A | Discharge status: Home / Self Care | Payer: Commercial Managed Care - PPO | Source: Ambulatory Visit | Attending: Family Medicine | Admitting: Family Medicine

## 2022-05-13 ENCOUNTER — Ambulatory Visit: Payer: Commercial Managed Care - PPO | Admitting: Family Medicine

## 2022-05-13 ENCOUNTER — Encounter: Payer: Self-pay | Admitting: Family Medicine

## 2022-05-13 VITALS — BP 110/72 | HR 76 | Temp 97.9°F | Wt 221.0 lb

## 2022-05-13 DIAGNOSIS — R109 Unspecified abdominal pain: Secondary | ICD-10-CM

## 2022-05-13 DIAGNOSIS — K449 Diaphragmatic hernia without obstruction or gangrene: Secondary | ICD-10-CM | POA: Diagnosis not present

## 2022-05-13 DIAGNOSIS — Z87442 Personal history of urinary calculi: Secondary | ICD-10-CM | POA: Diagnosis not present

## 2022-05-13 DIAGNOSIS — I700 Atherosclerosis of aorta: Secondary | ICD-10-CM | POA: Diagnosis not present

## 2022-05-13 LAB — BASIC METABOLIC PANEL
BUN: 13 mg/dL (ref 6–23)
CO2: 28 mEq/L (ref 19–32)
Calcium: 9.4 mg/dL (ref 8.4–10.5)
Chloride: 104 mEq/L (ref 96–112)
Creatinine, Ser: 0.71 mg/dL (ref 0.40–1.20)
GFR: 100.11 mL/min (ref >60.00)
Glucose, Bld: 80 mg/dL (ref 70–99)
Potassium: 3.8 mEq/L (ref 3.5–5.1)
Sodium: 141 mEq/L (ref 135–145)

## 2022-05-13 LAB — POC URINALSYSI DIPSTICK (AUTOMATED)
Bilirubin, UA: NEGATIVE
Glucose, UA: NEGATIVE
Ketones, UA: NEGATIVE
Leukocytes, UA: NEGATIVE
Nitrite, UA: NEGATIVE
Protein, UA: NEGATIVE
Spec Grav, UA: 1.015 (ref 1.010–1.025)
Urobilinogen, UA: 0.2 U/dL
pH, UA: 5.5 (ref 5.0–8.0)

## 2022-05-13 NOTE — Addendum Note (Signed)
Addended by: Wyvonne Lenz on: 05/13/2022 10:35 AM   Modules accepted: Orders

## 2022-05-13 NOTE — Progress Notes (Signed)
   Subjective:    Patient ID: Lisa Horne, female    DOB: 28-Nov-1975, 47 y.o.   MRN: [id number]  HPI Here to follow up an urgent care visit on 05-10-22 for a sharp left flank pain. This started about one week ago. The pain comes and goes. It has not moved. She had nausea and vomited once a few days ago due to the pain. No fever. No urinary urgency or burning. No blood in the urine. Her BM's have been normal. At the UC her UA showed 1+ blood but no bacteria. She was told to drink lots of water because a kidney stone was suspected, and she was started on Flomax 0.4 mg daily. Since then she has had no improvement.    Review of Systems  Constitutional: Negative.   Respiratory: Negative.    Cardiovascular: Negative.   Gastrointestinal: Negative.   Genitourinary:  Positive for flank pain. Negative for difficulty urinating, dysuria, frequency, hematuria and urgency.       Objective:   Physical Exam Constitutional:      General: She is not in acute distress.    Appearance: Normal appearance.  Cardiovascular:     Rate and Rhythm: Normal rate and regular rhythm.     Pulses: Normal pulses.     Heart sounds: Normal heart sounds.  Pulmonary:     Effort: Pulmonary effort is normal.     Breath sounds: Normal breath sounds.  Abdominal:     General: Abdomen is flat. Bowel sounds are normal. There is no distension.     Palpations: Abdomen is soft. There is no mass.     Tenderness: There is no abdominal tenderness. There is no right CVA tenderness, left CVA tenderness, guarding or rebound.     Hernia: No hernia is present.  Neurological:     Mental Status: She is alert.           Assessment & Plan:  Left flank pain, she likely has a ureteral stone. We will set up a CT renal stone studyt today and go from there. Culture the urine sample. We spent a total of (  32 ) minutes reviewing records and discussing these issues.  Alysia Penna, MD

## 2022-05-14 ENCOUNTER — Other Ambulatory Visit (HOSPITAL_COMMUNITY): Payer: Self-pay

## 2022-05-14 LAB — URINE CULTURE
MICRO NUMBER:: 14461161
Result:: NO GROWTH
SPECIMEN QUALITY:: ADEQUATE

## 2022-05-15 ENCOUNTER — Other Ambulatory Visit (HOSPITAL_COMMUNITY): Payer: Self-pay

## 2022-05-16 ENCOUNTER — Telehealth: Payer: Self-pay | Admitting: Family Medicine

## 2022-05-16 ENCOUNTER — Other Ambulatory Visit (HOSPITAL_COMMUNITY): Payer: Self-pay

## 2022-05-16 ENCOUNTER — Other Ambulatory Visit: Payer: Self-pay

## 2022-05-16 NOTE — Telephone Encounter (Signed)
[name] is calling to let office know the appeal for Lisa Horne has been approval 05-15-2022 approval for 29 weeks

## 2022-05-16 NOTE — Telephone Encounter (Signed)
Received prior authorization approval from [employer] for [facility] 0.25mg , ml pen.    Approval from 05/16/22 to 12/05/22.

## 2022-05-19 ENCOUNTER — Ambulatory Visit: Payer: Commercial Managed Care - PPO | Admitting: Family Medicine

## 2022-05-19 ENCOUNTER — Ambulatory Visit (INDEPENDENT_AMBULATORY_CARE_PROVIDER_SITE_OTHER)
Admission: RE | Admit: 2022-05-19 | Discharge: 2022-05-19 | Disposition: A | Discharge status: Home / Self Care | Payer: Commercial Managed Care - PPO | Source: Ambulatory Visit | Attending: Family Medicine | Admitting: Family Medicine

## 2022-05-19 ENCOUNTER — Encounter: Payer: Self-pay | Admitting: Family Medicine

## 2022-05-19 ENCOUNTER — Other Ambulatory Visit: Payer: Self-pay | Admitting: Family Medicine

## 2022-05-19 ENCOUNTER — Other Ambulatory Visit (HOSPITAL_COMMUNITY): Payer: Self-pay

## 2022-05-19 VITALS — BP 124/80 | HR 68 | Temp 98.0°F | Wt 219.0 lb

## 2022-05-19 DIAGNOSIS — I700 Atherosclerosis of aorta: Secondary | ICD-10-CM

## 2022-05-19 DIAGNOSIS — R109 Unspecified abdominal pain: Secondary | ICD-10-CM

## 2022-05-19 DIAGNOSIS — M79652 Pain in left thigh: Secondary | ICD-10-CM

## 2022-05-19 DIAGNOSIS — M545 Low back pain, unspecified: Secondary | ICD-10-CM

## 2022-05-19 LAB — POC URINALSYSI DIPSTICK (AUTOMATED)
Bilirubin, UA: NEGATIVE
Blood, UA: NEGATIVE
Glucose, UA: NEGATIVE
Ketones, UA: NEGATIVE
Leukocytes, UA: NEGATIVE
Nitrite, UA: NEGATIVE
Protein, UA: NEGATIVE
Spec Grav, UA: <=1.005 — AB (ref 1.010–1.025)
Urobilinogen, UA: NEGATIVE E.U./dL — AB
pH, UA: 6 (ref 5.0–8.0)

## 2022-05-19 MED ORDER — CYCLOBENZAPRINE HCL 10 MG PO TABS
10.0000 mg | ORAL_TABLET | Freq: Three times a day (TID) | ORAL | 5 refills | Status: DC | PRN
  Filled 2022-05-19: qty 60, 20d supply, fill #0
  Filled 2022-09-14: qty 60, 20d supply, fill #1
  Filled 2022-12-06 – 2023-03-04 (×3): qty 60, 20d supply, fill #2

## 2022-05-19 MED ORDER — WEGOVY 2.4 MG/0.75ML ~~LOC~~ SOAJ
2.4000 mg | SUBCUTANEOUS | 3 refills | Status: DC
  Filled 2022-05-19: qty 3, 28d supply, fill #0
  Filled 2022-06-10: qty 3, 28d supply, fill #1
  Filled 2022-07-15 (×2): qty 3, 28d supply, fill #2
  Filled 2022-08-04: qty 3, 28d supply, fill #3
  Filled 2022-09-14 – 2022-09-18 (×2): qty 3, 28d supply, fill #4

## 2022-05-19 MED ORDER — ATORVASTATIN CALCIUM 10 MG PO TABS: 10.0000 mg | ORAL_TABLET | Freq: Every day | ORAL | 3 refills | Status: DC

## 2022-05-19 NOTE — Progress Notes (Signed)
   Subjective:    Patient ID: Lisa Horne, female    DOB: [birth year], 47 y.o.   MRN: [id number]  HPI Here for several issues. First she continues to have an intermittent pain in the left lower back and left flank that started 3 weeks ago. Sometimes this is dull and sometimes it is sharp. Laying flat on her back seems to bring out the pain. She has had a CT renal scan which was negative. No urinary or bowel symptoms. She also mentions an intermittent left thigh pain that started several months ago. She has not mentioned this to [place] before today because she did not think it was related. Also she asks about the finding of aortic atherosclerosis on the CT renogram. She does not use tobacco. She takes Atorvastatin 10 mg daily.    Review of Systems  Constitutional: Negative.   Respiratory: Negative.    Cardiovascular: Negative.   Gastrointestinal: Negative.   Genitourinary:  Positive for flank pain. Negative for dysuria, frequency, pelvic pain and urgency.  Musculoskeletal:  Positive for arthralgias.       Objective:   Physical Exam Constitutional:      Appearance: Normal appearance.  Cardiovascular:     Rate and Rhythm: Normal rate and regular rhythm.     Pulses: Normal pulses.     Heart sounds: Normal heart sounds.  Pulmonary:     Effort: Pulmonary effort is normal.     Breath sounds: Normal breath sounds.  Abdominal:     General: Abdomen is flat. Bowel sounds are normal. There is no distension.     Palpations: Abdomen is soft. There is no mass.     Tenderness: There is no abdominal tenderness. There is no right CVA tenderness, left CVA tenderness, guarding or rebound.     Hernia: No hernia is present.  Musculoskeletal:        General: No tenderness.     Comments: Spine has full ROM. The left thigh is normal on exam   Neurological:     Mental Status: She is alert.           Assessment & Plan:  The left lower back and flank pain seems to be musculoskeletal in origin, It  is not clear if the left thigh pain is related or not. We will get Xrays today of the lumbar spine and the left femur. As for the aortic atherosclerosis, we are doing what we can to reduce her risk. I offered a referral to Cardiology but she declined at this time.  Alysia Penna, MD

## 2022-05-26 ENCOUNTER — Ambulatory Visit: Payer: Commercial Managed Care - PPO | Admitting: Family Medicine

## 2022-05-26 ENCOUNTER — Other Ambulatory Visit: Payer: Self-pay | Admitting: Family Medicine

## 2022-05-27 ENCOUNTER — Other Ambulatory Visit: Payer: Self-pay

## 2022-05-27 ENCOUNTER — Other Ambulatory Visit (HOSPITAL_COMMUNITY): Payer: Self-pay

## 2022-05-27 MED ORDER — FAMOTIDINE 40 MG PO TABS
40.0000 mg | ORAL_TABLET | Freq: Two times a day (BID) | ORAL | 0 refills | Status: DC
  Filled 2022-05-27 – 2022-06-10 (×2): qty 180, 90d supply, fill #0

## 2022-05-27 MED ORDER — VALACYCLOVIR HCL 500 MG PO TABS
500.0000 mg | ORAL_TABLET | Freq: Two times a day (BID) | ORAL | 5 refills | Status: DC
  Filled 2022-05-27 – 2022-12-06 (×2): qty 60, 30d supply, fill #0

## 2022-06-05 ENCOUNTER — Other Ambulatory Visit (HOSPITAL_COMMUNITY): Payer: Self-pay

## 2022-06-10 ENCOUNTER — Other Ambulatory Visit (HOSPITAL_COMMUNITY): Payer: Self-pay

## 2022-06-10 ENCOUNTER — Other Ambulatory Visit: Payer: Self-pay

## 2022-06-11 ENCOUNTER — Other Ambulatory Visit: Payer: Self-pay

## 2022-06-18 ENCOUNTER — Other Ambulatory Visit (HOSPITAL_COMMUNITY): Payer: Self-pay

## 2022-07-06 ENCOUNTER — Other Ambulatory Visit: Payer: Self-pay | Admitting: Family Medicine

## 2022-07-07 ENCOUNTER — Other Ambulatory Visit (HOSPITAL_COMMUNITY): Payer: Self-pay

## 2022-07-07 MED ORDER — PHENTERMINE HCL 37.5 MG PO CAPS
ORAL_CAPSULE | ORAL | 5 refills | Status: DC
  Filled 2022-07-15: qty 30, 30d supply, fill #0
  Filled 2022-09-14: qty 30, 30d supply, fill #1
  Filled 2022-11-21 – 2022-12-01 (×2): qty 30, 30d supply, fill #2

## 2022-07-07 MED ORDER — CYANOCOBALAMIN 1000 MCG/ML IJ SOLN
INTRAMUSCULAR | 11 refills | Status: DC
  Filled 2022-07-15: qty 10, 70d supply, fill #0
  Filled 2022-09-18 – 2022-12-01 (×3): qty 10, 70d supply, fill #1

## 2022-07-07 NOTE — Telephone Encounter (Signed)
Last refill Cyanocobalamin injection-06/20/21 Last refill Phentermine-01/01/22-30 capsules,5 refills  Last OV-05/19/22 No future OV scheduled.

## 2022-07-15 ENCOUNTER — Other Ambulatory Visit (HOSPITAL_COMMUNITY): Payer: Self-pay

## 2022-07-24 ENCOUNTER — Encounter: Payer: Self-pay | Admitting: Family Medicine

## 2022-07-24 ENCOUNTER — Ambulatory Visit: Payer: Commercial Managed Care - PPO | Admitting: Family Medicine

## 2022-07-24 VITALS — BP 102/78 | HR 78 | Temp 97.6°F | Wt 203.0 lb

## 2022-07-24 DIAGNOSIS — M67431 Ganglion, right wrist: Secondary | ICD-10-CM

## 2022-07-24 NOTE — Progress Notes (Signed)
   Subjective:    Patient ID: Lisa Horne, female    DOB: [birth year], 47 y.o.   MRN: [id number]  HPI Here to check a painful lump that appeared on her right wrist 2 weeks ago. This causes pain to radiate in to the right thumb.    Review of Systems  Constitutional: Negative.   Respiratory: Negative.    Cardiovascular: Negative.   Musculoskeletal:  Positive for arthralgias.       Objective:   Physical Exam Constitutional:      Appearance: Normal appearance.  Cardiovascular:     Rate and Rhythm: Normal rate and regular rhythm.     Pulses: Normal pulses.     Heart sounds: Normal heart sounds.  Pulmonary:     Effort: Pulmonary effort is normal.     Breath sounds: Normal breath sounds.  Musculoskeletal:     Comments: There is a tender non-mobile cystic mass on the dorsal surface of the right wrist. ROM is full   Neurological:     Mental Status: She is alert.           Assessment & Plan:  Ganglion cyst. We agreed that she will observe this for a few more weeks. At that point if it has not receded we may consider referral to a hand surgeon.  Alysia Penna, MD

## 2022-08-04 ENCOUNTER — Other Ambulatory Visit (HOSPITAL_COMMUNITY): Payer: Self-pay

## 2022-08-07 ENCOUNTER — Other Ambulatory Visit (HOSPITAL_COMMUNITY): Payer: Self-pay

## 2022-09-14 ENCOUNTER — Other Ambulatory Visit: Payer: Self-pay | Admitting: Family Medicine

## 2022-09-16 ENCOUNTER — Other Ambulatory Visit (HOSPITAL_COMMUNITY): Payer: Self-pay

## 2022-09-16 MED ORDER — IBUPROFEN 800 MG PO TABS
800.0000 mg | ORAL_TABLET | Freq: Three times a day (TID) | ORAL | 0 refills | Status: DC | PRN
  Filled 2022-09-16: qty 90, 30d supply, fill #0

## 2022-09-16 MED ORDER — FERROUS SULFATE 325 (65 FE) MG PO TBEC
1.0000 | DELAYED_RELEASE_TABLET | Freq: Two times a day (BID) | ORAL | 3 refills | Status: DC
  Filled 2022-09-16: qty 200, 100d supply, fill #0

## 2022-09-17 ENCOUNTER — Other Ambulatory Visit (HOSPITAL_COMMUNITY): Payer: Self-pay

## 2022-09-18 ENCOUNTER — Other Ambulatory Visit (HOSPITAL_COMMUNITY): Payer: Self-pay

## 2022-09-18 ENCOUNTER — Other Ambulatory Visit: Payer: Self-pay

## 2022-09-26 ENCOUNTER — Other Ambulatory Visit (HOSPITAL_COMMUNITY): Payer: Self-pay

## 2022-09-29 ENCOUNTER — Ambulatory Visit: Payer: Commercial Managed Care - PPO | Admitting: Sports Medicine

## 2022-09-29 ENCOUNTER — Other Ambulatory Visit (INDEPENDENT_AMBULATORY_CARE_PROVIDER_SITE_OTHER): Payer: Commercial Managed Care - PPO

## 2022-09-29 DIAGNOSIS — M67431 Ganglion, right wrist: Secondary | ICD-10-CM

## 2022-09-29 DIAGNOSIS — M25531 Pain in right wrist: Secondary | ICD-10-CM | POA: Diagnosis not present

## 2022-09-29 DIAGNOSIS — G8929 Other chronic pain: Secondary | ICD-10-CM

## 2022-09-29 NOTE — Progress Notes (Signed)
Cyst on right wrist noticeable since March Not painful, but feels like her thumb is going numb

## 2022-09-29 NOTE — Progress Notes (Unsigned)
Lisa Horne - 47 y.o. female MRN [id number]  Date of birth: [birth year]  Office Visit Note: Visit Date: 09/29/2022 PCP: Nelwyn Salisbury, MD Referred by: Nelwyn Salisbury, MD  Subjective: Chief Complaint  Patient presents with   Right Wrist - Cyst   HPI: Lisa Horne is a pleasant 47 y.o. female who presents today for right wrist volar cyst.   Pertinent ROS were reviewed with the patient and found to be negative unless otherwise specified above in HPI.   Assessment & Plan: Visit Diagnoses:  1. 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 Chronic pain of right wrist     Plan: ***  Follow-up: Return in about 1 week (around 10/06/2022) for right volar ganglion cyst.   Meds & Orders: No orders of the defined types were placed in this encounter.   Orders Placed This Encounter  Procedures   XR Wrist Complete Right   [place] Extrem Up Right Ltd     Procedures: No procedures performed      1.5 cc's of serous fluid    Clinical History: No specialty comments available.  She reports that she has never smoked. She has never used smokeless tobacco.  Recent Labs    03/10/22 0759  HGBA1C 5.8    Objective:   Vital Signs: There were no vitals taken for this visit.  Physical Exam  Gen: Well-appearing, in no acute distress; non-toxic CV:  Well-perfused. Warm.  Resp: Breathing unlabored on room air; no wheezing. Psych: Fluid speech in conversation; appropriate affect; normal thought process Neuro: Sensation intact throughout. No gross coordination deficits.   Ortho Exam - Right wrist:   Imaging: No results found.         Past Medical/Family/Surgical/Social History: Medications & Allergies reviewed per EMR, new medications updated. 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 06/13/2011   BACK PAIN, LUMBAR 04/30/2010   SPRAIN&STRAIN OF UNSPECIFIED SITE OF HIP&THIGH 11/09/2009   Hypothyroidism 12/02/2006   Essential hypertension 12/02/2006   Past Medical History:  Diagnosis Date   Allergy    Anemia    GERD (gastroesophageal reflux disease)    Hyperlipemia    Morbid obesity (HCC)    PCOS (polycystic ovarian syndrome)    ON METFOMRIN   PONV (postoperative nausea and vomiting)    Prediabetes    Thyroid disease    hypothyroidism   Family History  Problem Relation Age of Onset   Breast cancer Mother    Hypertension Mother    Cancer Mother        breast   Depression Mother    Diabetes Father    Cancer Father        skin   Colon polyps Sister    Colon cancer Sister    Depression Sister    Colon cancer Maternal Uncle    Esophageal cancer Neg Hx    Rectal cancer Neg Hx    Stomach cancer Neg Hx    Past Surgical History:  Procedure Laterality Date   COLONOSCOPY  03/11/2021   per Dr. Leone Payor, diverticulosis, no polyps, repeat in 10 yrs   GASTRIC BANDING PORT REVISION     GASTRIC  RESTRICTION SURGERY     lap band 2005   LAPAROSCOPIC GASTRIC SLEEVE RESECTION     Social History   Occupational History   Not on file  Tobacco Use   Smoking status: Never   Smokeless tobacco: Never  Vaping Use   Vaping Use: Never used  Substance and Sexual Activity   Alcohol use: No    Alcohol/week: 0.0 standard drinks of alcohol   Drug use: No   Sexual activity: Yes    Birth control/protection: I.U.D.

## 2022-09-30 ENCOUNTER — Encounter: Payer: Self-pay | Admitting: Sports Medicine

## 2022-10-14 ENCOUNTER — Ambulatory Visit: Payer: Commercial Managed Care - PPO | Admitting: Orthopaedic Surgery

## 2022-10-17 ENCOUNTER — Encounter: Payer: Self-pay | Admitting: Sports Medicine

## 2022-10-19 ENCOUNTER — Encounter: Payer: Self-pay | Admitting: Family Medicine

## 2022-10-24 ENCOUNTER — Ambulatory Visit: Payer: Commercial Managed Care - PPO | Admitting: Family Medicine

## 2022-10-24 VITALS — BP 102/72 | HR 65 | Temp 98.3°F | Wt 208.0 lb

## 2022-10-24 DIAGNOSIS — Z6841 Body Mass Index (BMI) 40.0 and over, adult: Secondary | ICD-10-CM | POA: Diagnosis not present

## 2022-10-24 NOTE — Progress Notes (Signed)
   Subjective:    Patient ID: Lisa Horne, female    DOB: 05-11-1975, 47 y.o.   MRN: [id number]  HPI Here to discuss alternatives for the Semaglutide shots she is taking. They are working but she wants to try an oral version. Her insurance will not cover Rybelsus pills, but she has been talking to a pharmacist at [facility] pharmacy about this. With a prescription they can produce a sublingual form of Semaglutide that apparently works better than the pills and is also cheaper. She asks my opinion on this.    Review of Systems  Constitutional: Negative.   Respiratory: Negative.    Cardiovascular: Negative.        Objective:   Physical Exam Constitutional:      Appearance: Normal appearance.  Cardiovascular:     Rate and Rhythm: Normal rate and regular rhythm.     Pulses: Normal pulses.     Heart sounds: Normal heart sounds.  Pulmonary:     Effort: Pulmonary effort is normal.     Breath sounds: Normal breath sounds.  Neurological:     Mental Status: She is alert.           Assessment & Plan:  Obesity. After I did some research on sublingual Semaglutide, I agreed that she should give this a try. The [facility] pharmacist will send [place] over some dosing information about it, and we will go from there.  Gershon Crane, MD

## 2022-10-27 ENCOUNTER — Ambulatory Visit: Payer: Commercial Managed Care - PPO | Admitting: Sports Medicine

## 2022-11-21 ENCOUNTER — Other Ambulatory Visit (HOSPITAL_COMMUNITY): Payer: Self-pay

## 2022-11-21 ENCOUNTER — Encounter: Payer: Self-pay | Admitting: Orthopedic Surgery

## 2022-11-21 ENCOUNTER — Encounter: Payer: Self-pay | Admitting: Family Medicine

## 2022-11-21 ENCOUNTER — Ambulatory Visit: Payer: Commercial Managed Care - PPO | Admitting: Orthopedic Surgery

## 2022-11-21 DIAGNOSIS — M67431 Ganglion, right wrist: Secondary | ICD-10-CM | POA: Diagnosis not present

## 2022-11-21 NOTE — Telephone Encounter (Signed)
No we never heard from them

## 2022-11-21 NOTE — Progress Notes (Signed)
Lisa Horne - 47 y.o. female MRN [id number]  Date of birth: [birth year]  Office Visit Note: Visit Date: 11/21/2022 PCP: Nelwyn Salisbury, MD Referred by: Nelwyn Salisbury, MD  Subjective: Chief Complaint  Patient presents with   Right Wrist - Cyst   HPI: EMMILY Horne is a pleasant 47 y.o. female who presents today for evaluation of a right volar ganglion cyst has been present for approximate 6 months.  She was seen recently by Dr. Shon Baton and underwent aspiration in June of this year.  It has since recurred, she estimates approximately 3 weeks after aspiration.  Is painful in nature, limiting her activities with the right wrist.  At this juncture, she is interested in discussing surgical intervention.  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 Shon Baton Prior Surgery: None   *showed up in March- has not really went down*   Pertinent ROS were reviewed with the patient and found to be negative unless otherwise specified above in HPI.   Assessment & Plan: Visit Diagnoses:  1. 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  Given the involvement with the arterial structures, I would like to obtain an MRI of the right wrist to better plan preoperatively.  We discussed risk and benefits of surgical excision, risk include but limited to infection, bleeding, scarring, stiffness, nerve injury, vascular injury, tendon injury, risk of recurrence and need for subsequent operation.  Patient consented understanding the above.  She will return to me after the right wrist MRI is complete for further discussion and likely surgical scheduling.  Follow-up: No follow-ups on file.   Meds & Orders: No orders of the defined types were  placed in this encounter.   Orders Placed This Encounter  Procedures   MR Wrist Right w/o contrast     Procedures: No procedures performed      Clinical History: No specialty comments available.  She reports that she has never smoked. She has never used smokeless tobacco.  Recent Labs    03/10/22 0759  HGBA1C 5.8    Objective:   Vital Signs: There were no vitals taken for this visit.  Physical Exam  Gen: Well-appearing, in no acute distress; non-toxic CV: Regular Rate. Well-perfused. Warm.  Resp: Breathing unlabored on room air; no wheezing. Psych: Fluid speech in conversation; appropriate affect; normal thought process Neuro: Sensation intact throughout. No gross coordination deficits.   Ortho Exam Right wrist: - Palpable mass over the volar radial aspect of the right wrist, measures approximately 3 cm x 2 cm, soft, compressible, mobile - Wrist range of motion flexion/extension 65/55, full pronation/supination - Allen's testing at the wrist demonstrates appropriate refill with both radial and ulnar flow to the hand - Sensation is intact in all distributions, warm well-perfused throughout  Imaging: No results found.  Past Medical/Family/Surgical/Social History: Medications & Allergies reviewed per EMR, new medications updated. 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 06/13/2011   BACK  PAIN, LUMBAR 04/30/2010   SPRAIN&STRAIN OF UNSPECIFIED SITE OF HIP&THIGH 11/09/2009   Hypothyroidism 12/02/2006   Essential hypertension 12/02/2006   Past Medical History:  Diagnosis Date   Allergy    Anemia    GERD (gastroesophageal reflux disease)     Hyperlipemia    Morbid obesity (HCC)    PCOS (polycystic ovarian syndrome)    ON METFOMRIN   PONV (postoperative nausea and vomiting)    Prediabetes    Thyroid disease    hypothyroidism   Family History  Problem Relation Age of Onset   Breast cancer Mother    Hypertension Mother    Cancer Mother        breast   Depression Mother    Diabetes Father    Cancer Father        skin   Colon polyps Sister    Colon cancer Sister    Depression Sister    Colon cancer Maternal Uncle    Esophageal cancer Neg Hx    Rectal cancer Neg Hx    Stomach cancer Neg Hx    Past Surgical History:  Procedure Laterality Date   COLONOSCOPY  03/11/2021   per Dr. Leone Payor, diverticulosis, no polyps, repeat in 10 yrs   GASTRIC BANDING PORT REVISION     GASTRIC RESTRICTION SURGERY     lap band 2005   LAPAROSCOPIC GASTRIC SLEEVE RESECTION     Social History   Occupational History   Not on file  Tobacco Use   Smoking status: Never   Smokeless tobacco: Never  Vaping Use   Vaping status: Never Used  Substance and Sexual Activity   Alcohol use: No    Alcohol/week: 0.0 standard drinks of alcohol   Drug use: No   Sexual activity: Yes    Birth control/protection: I.U.D.     Lisa Horne, M.D. [facility] 8:40 AM

## 2022-11-21 NOTE — H&P (View-Only) (Signed)
 Lisa Horne - 47 y.o. female MRN [id number]  Date of birth: [birth year]  Office Visit Note: Visit Date: 11/21/2022 PCP: Nelwyn Salisbury, MD Referred by: Nelwyn Salisbury, MD  Subjective: Chief Complaint  Patient presents with   Right Wrist - Cyst   HPI: Lisa Horne is a pleasant 47 y.o. female who presents today for evaluation of a right volar ganglion cyst has been present for approximate 6 months.  She was seen recently by Dr. Shon Baton and underwent aspiration in June of this year.  It has since recurred, she estimates approximately 3 weeks after aspiration.  Is painful in nature, limiting her activities with the right wrist.  At this juncture, she is interested in discussing surgical intervention.  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 Shon Baton Prior Surgery: None   *showed up in March- has not really went down*   Pertinent ROS were reviewed with the patient and found to be negative unless otherwise specified above in HPI.   Assessment & Plan: Visit Diagnoses:  1. 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  Given the involvement with the arterial structures, I would like to obtain an MRI of the right wrist to better plan preoperatively.  We discussed risk and benefits of surgical excision, risk include but limited to infection, bleeding, scarring, stiffness, nerve injury, vascular injury, tendon injury, risk of recurrence and need for subsequent operation.  Patient consented understanding the above.  She will return to me after the right wrist MRI is complete for further discussion and likely surgical scheduling.  Follow-up: No follow-ups on file.   Meds & Orders: No orders of the defined types were  placed in this encounter.   Orders Placed This Encounter  Procedures   MR Wrist Right w/o contrast     Procedures: No procedures performed      Clinical History: No specialty comments available.  She reports that she has never smoked. She has never used smokeless tobacco.  Recent Labs    03/10/22 0759  HGBA1C 5.8    Objective:   Vital Signs: There were no vitals taken for this visit.  Physical Exam  Gen: Well-appearing, in no acute distress; non-toxic CV: Regular Rate. Well-perfused. Warm.  Resp: Breathing unlabored on room air; no wheezing. Psych: Fluid speech in conversation; appropriate affect; normal thought process Neuro: Sensation intact throughout. No gross coordination deficits.   Ortho Exam Right wrist: - Palpable mass over the volar radial aspect of the right wrist, measures approximately 3 cm x 2 cm, soft, compressible, mobile - Wrist range of motion flexion/extension 65/55, full pronation/supination - Allen's testing at the wrist demonstrates appropriate refill with both radial and ulnar flow to the hand - Sensation is intact in all distributions, warm well-perfused throughout  Imaging: No results found.  Past Medical/Family/Surgical/Social History: Medications & Allergies reviewed per EMR, new medications updated. 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 06/13/2011   BACK  PAIN, LUMBAR 04/30/2010   SPRAIN&STRAIN OF UNSPECIFIED SITE OF HIP&THIGH 11/09/2009   Hypothyroidism 12/02/2006   Essential hypertension 12/02/2006   Past Medical History:  Diagnosis Date   Allergy    Anemia    GERD (gastroesophageal reflux disease)     Hyperlipemia    Morbid obesity (HCC)    PCOS (polycystic ovarian syndrome)    ON METFOMRIN   PONV (postoperative nausea and vomiting)    Prediabetes    Thyroid disease    hypothyroidism   Family History  Problem Relation Age of Onset   Breast cancer Mother    Hypertension Mother    Cancer Mother        breast   Depression Mother    Diabetes Father    Cancer Father        skin   Colon polyps Sister    Colon cancer Sister    Depression Sister    Colon cancer Maternal Uncle    Esophageal cancer Neg Hx    Rectal cancer Neg Hx    Stomach cancer Neg Hx    Past Surgical History:  Procedure Laterality Date   COLONOSCOPY  03/11/2021   per Dr. Leone Payor, diverticulosis, no polyps, repeat in 10 yrs   GASTRIC BANDING PORT REVISION     GASTRIC RESTRICTION SURGERY     lap band 2005   LAPAROSCOPIC GASTRIC SLEEVE RESECTION     Social History   Occupational History   Not on file  Tobacco Use   Smoking status: Never   Smokeless tobacco: Never  Vaping Use   Vaping status: Never Used  Substance and Sexual Activity   Alcohol use: No    Alcohol/week: 0.0 standard drinks of alcohol   Drug use: No   Sexual activity: Yes    Birth control/protection: I.U.D.    Anshul Trevor Mace, M.D. [facility] 8:40 AM

## 2022-11-22 ENCOUNTER — Other Ambulatory Visit: Payer: Commercial Managed Care - PPO

## 2022-11-22 DIAGNOSIS — M67431 Ganglion, right wrist: Secondary | ICD-10-CM

## 2022-11-22 DIAGNOSIS — R2231 Localized swelling, mass and lump, right upper limb: Secondary | ICD-10-CM | POA: Diagnosis not present

## 2022-11-22 DIAGNOSIS — M25531 Pain in right wrist: Secondary | ICD-10-CM | POA: Diagnosis not present

## 2022-11-24 ENCOUNTER — Other Ambulatory Visit: Payer: Self-pay | Admitting: Orthopedic Surgery

## 2022-11-24 ENCOUNTER — Other Ambulatory Visit: Payer: Self-pay

## 2022-11-24 ENCOUNTER — Encounter (HOSPITAL_BASED_OUTPATIENT_CLINIC_OR_DEPARTMENT_OTHER): Payer: Self-pay | Admitting: Orthopedic Surgery

## 2022-11-24 DIAGNOSIS — M67431 Ganglion, right wrist: Secondary | ICD-10-CM

## 2022-11-24 NOTE — Progress Notes (Signed)
   11/24/22 1306  PAT Phone Screen  Is the patient taking a GLP-1 receptor agonist? Yes  Has the patient been informed on holding medication? (S)  Yes (last dose 11/21/22)  Do You Have Diabetes? No  Do You Have Hypertension? No  Have You Ever Been to the ER for Asthma? No  Have You Taken Oral Steroids in the Past 3 Months? No  Do you Take Phenteramine or any Other Diet Drugs? (S)  Yes (not taking and will not resume until after surgery)  Recent  Lab Work, EKG, CXR? No  Do you have a history of heart problems? No  Any Recent Hospitalizations? No  Height 5\' 9"  (1.753 m)  Weight 94.3 kg  Pat Appointment Scheduled No  Reason for No Appointment Not Needed

## 2022-11-26 ENCOUNTER — Ambulatory Visit: Payer: Commercial Managed Care - PPO | Admitting: Orthopedic Surgery

## 2022-11-28 ENCOUNTER — Ambulatory Visit (HOSPITAL_BASED_OUTPATIENT_CLINIC_OR_DEPARTMENT_OTHER)
Admission: RE | Admit: 2022-11-28 | Discharge: 2022-11-28 | Disposition: A | Discharge status: Home / Self Care | Payer: Commercial Managed Care - PPO | Source: Home / Self Care | Attending: Orthopedic Surgery | Admitting: Orthopedic Surgery

## 2022-11-28 ENCOUNTER — Encounter (HOSPITAL_BASED_OUTPATIENT_CLINIC_OR_DEPARTMENT_OTHER)
Admission: RE | Disposition: A | Discharge status: Home / Self Care | Payer: Self-pay | Source: Home / Self Care | Attending: Orthopedic Surgery

## 2022-11-28 ENCOUNTER — Other Ambulatory Visit: Payer: Self-pay

## 2022-11-28 ENCOUNTER — Encounter (HOSPITAL_BASED_OUTPATIENT_CLINIC_OR_DEPARTMENT_OTHER): Payer: Self-pay | Admitting: Orthopedic Surgery

## 2022-11-28 ENCOUNTER — Ambulatory Visit (HOSPITAL_BASED_OUTPATIENT_CLINIC_OR_DEPARTMENT_OTHER): Payer: Commercial Managed Care - PPO | Admitting: Anesthesiology

## 2022-11-28 DIAGNOSIS — M67431 Ganglion, right wrist: Secondary | ICD-10-CM | POA: Diagnosis not present

## 2022-11-28 DIAGNOSIS — Z6830 Body mass index (BMI) 30.0-30.9, adult: Secondary | ICD-10-CM | POA: Diagnosis not present

## 2022-11-28 DIAGNOSIS — K219 Gastro-esophageal reflux disease without esophagitis: Secondary | ICD-10-CM | POA: Diagnosis not present

## 2022-11-28 DIAGNOSIS — Z9884 Bariatric surgery status: Secondary | ICD-10-CM | POA: Diagnosis not present

## 2022-11-28 DIAGNOSIS — Z01818 Encounter for other preprocedural examination: Secondary | ICD-10-CM

## 2022-11-28 DIAGNOSIS — I10 Essential (primary) hypertension: Secondary | ICD-10-CM

## 2022-11-28 DIAGNOSIS — I700 Atherosclerosis of aorta: Secondary | ICD-10-CM

## 2022-11-28 DIAGNOSIS — M67432 Ganglion, left wrist: Secondary | ICD-10-CM

## 2022-11-28 DIAGNOSIS — E039 Hypothyroidism, unspecified: Secondary | ICD-10-CM

## 2022-11-28 DIAGNOSIS — E785 Hyperlipidemia, unspecified: Secondary | ICD-10-CM | POA: Diagnosis not present

## 2022-11-28 DIAGNOSIS — M6740 Ganglion, unspecified site: Secondary | ICD-10-CM | POA: Diagnosis not present

## 2022-11-28 HISTORY — PX: CYST EXCISION: SHX5701

## 2022-11-28 LAB — POCT PREGNANCY, URINE: Preg Test, Ur: NEGATIVE

## 2022-11-28 SURGERY — CYST REMOVAL
Anesthesia: General | Site: Wrist | Laterality: Right

## 2022-11-28 MED ORDER — PROPOFOL 10 MG/ML IV BOLUS
INTRAVENOUS | Status: DC | PRN
Start: 2022-11-28 — End: 2022-11-28
  Administered 2022-11-28: 50 mg via INTRAVENOUS
  Administered 2022-11-28: 200 mg via INTRAVENOUS

## 2022-11-28 MED ORDER — ACETAMINOPHEN 325 MG PO TABS: 325.0000 mg | ORAL_TABLET | ORAL | Status: DC | PRN

## 2022-11-28 MED ORDER — ACETAMINOPHEN 500 MG PO TABS
ORAL_TABLET | ORAL | Status: AC
  Filled 2022-11-28: qty 2

## 2022-11-28 MED ORDER — ONDANSETRON HCL 4 MG/2ML IJ SOLN
INTRAMUSCULAR | Status: AC
  Filled 2022-11-28: qty 2

## 2022-11-28 MED ORDER — SCOPOLAMINE 1 MG/3DAYS TD PT72
1.0000 | MEDICATED_PATCH | TRANSDERMAL | Status: DC
  Administered 2022-11-28: 1.5 mg via TRANSDERMAL

## 2022-11-28 MED ORDER — OXYCODONE HCL 5 MG PO TABS: 5.0000 mg | ORAL_TABLET | Freq: Once | ORAL | Status: DC | PRN

## 2022-11-28 MED ORDER — ONDANSETRON HCL 4 MG/2ML IJ SOLN
4.0000 mg | Freq: Once | INTRAMUSCULAR | Status: AC | PRN
  Administered 2022-11-28: 4 mg via INTRAVENOUS

## 2022-11-28 MED ORDER — CEFAZOLIN SODIUM-DEXTROSE 2-4 GM/100ML-% IV SOLN
INTRAVENOUS | Status: AC
  Filled 2022-11-28: qty 100

## 2022-11-28 MED ORDER — LIDOCAINE 2% (20 MG/ML) 5 ML SYRINGE
INTRAMUSCULAR | Status: AC
  Filled 2022-11-28: qty 5

## 2022-11-28 MED ORDER — LACTATED RINGERS IV SOLN: INTRAVENOUS | Status: DC

## 2022-11-28 MED ORDER — LIDOCAINE-EPINEPHRINE (PF) 1 %-1:200000 IJ SOLN
INTRAMUSCULAR | Status: AC
  Filled 2022-11-28: qty 30

## 2022-11-28 MED ORDER — ACETAMINOPHEN 500 MG PO TABS
1000.0000 mg | ORAL_TABLET | Freq: Once | ORAL | Status: AC
  Administered 2022-11-28: 1000 mg via ORAL

## 2022-11-28 MED ORDER — FENTANYL CITRATE (PF) 100 MCG/2ML IJ SOLN
25.0000 ug | INTRAMUSCULAR | Status: DC | PRN
  Administered 2022-11-28 (×2): 25 ug via INTRAVENOUS

## 2022-11-28 MED ORDER — DEXAMETHASONE SODIUM PHOSPHATE 10 MG/ML IJ SOLN
INTRAMUSCULAR | Status: AC
  Filled 2022-11-28: qty 1

## 2022-11-28 MED ORDER — EPHEDRINE 5 MG/ML INJ
INTRAVENOUS | Status: AC
  Filled 2022-11-28: qty 5

## 2022-11-28 MED ORDER — BUPIVACAINE HCL (PF) 0.25 % IJ SOLN
INTRAMUSCULAR | Status: AC
  Filled 2022-11-28: qty 30

## 2022-11-28 MED ORDER — MIDAZOLAM HCL 5 MG/5ML IJ SOLN
INTRAMUSCULAR | Status: DC | PRN
  Administered 2022-11-28: 2 mg via INTRAVENOUS

## 2022-11-28 MED ORDER — 0.9 % SODIUM CHLORIDE (POUR BTL) OPTIME
TOPICAL | Status: DC | PRN
Start: 2022-11-28 — End: 2022-11-28
  Administered 2022-11-28: 200 mL

## 2022-11-28 MED ORDER — SCOPOLAMINE 1 MG/3DAYS TD PT72
MEDICATED_PATCH | TRANSDERMAL | Status: AC
  Filled 2022-11-28: qty 1

## 2022-11-28 MED ORDER — CEFAZOLIN SODIUM-DEXTROSE 2-4 GM/100ML-% IV SOLN
2.0000 g | INTRAVENOUS | Status: AC
  Administered 2022-11-28: 2 g via INTRAVENOUS

## 2022-11-28 MED ORDER — FENTANYL CITRATE (PF) 100 MCG/2ML IJ SOLN
INTRAMUSCULAR | Status: DC | PRN
  Administered 2022-11-28 (×3): 25 ug via INTRAVENOUS

## 2022-11-28 MED ORDER — ACETAMINOPHEN 160 MG/5ML PO SOLN: 325.0000 mg | ORAL | Status: DC | PRN

## 2022-11-28 MED ORDER — LIDOCAINE HCL (CARDIAC) PF 100 MG/5ML IV SOSY
PREFILLED_SYRINGE | INTRAVENOUS | Status: DC | PRN
  Administered 2022-11-28: 100 mg via INTRAVENOUS

## 2022-11-28 MED ORDER — DEXAMETHASONE SODIUM PHOSPHATE 10 MG/ML IJ SOLN
INTRAMUSCULAR | Status: DC | PRN
  Administered 2022-11-28: 10 mg via INTRAVENOUS

## 2022-11-28 MED ORDER — BUPIVACAINE HCL (PF) 0.25 % IJ SOLN
INTRAMUSCULAR | Status: DC | PRN
  Administered 2022-11-28: 10 mL

## 2022-11-28 MED ORDER — EPHEDRINE SULFATE (PRESSORS) 50 MG/ML IJ SOLN
INTRAMUSCULAR | Status: DC | PRN
  Administered 2022-11-28 (×3): 5 mg via INTRAVENOUS

## 2022-11-28 MED ORDER — MEPERIDINE HCL 25 MG/ML IJ SOLN: 6.2500 mg | INTRAMUSCULAR | Status: DC | PRN

## 2022-11-28 MED ORDER — MIDAZOLAM HCL 2 MG/2ML IJ SOLN
INTRAMUSCULAR | Status: AC
  Filled 2022-11-28: qty 2

## 2022-11-28 MED ORDER — FENTANYL CITRATE (PF) 100 MCG/2ML IJ SOLN
INTRAMUSCULAR | Status: AC
  Filled 2022-11-28: qty 2

## 2022-11-28 MED ORDER — OXYCODONE HCL 5 MG/5ML PO SOLN: 5.0000 mg | Freq: Once | ORAL | Status: DC | PRN

## 2022-11-28 MED ORDER — ONDANSETRON HCL 4 MG/2ML IJ SOLN
INTRAMUSCULAR | Status: DC | PRN
  Administered 2022-11-28: 4 mg via INTRAVENOUS

## 2022-11-28 SURGICAL SUPPLY — 38 items
APL PRP 5X4 STRL LF DISP 70% (MISCELLANEOUS) ×1
APPLICATOR CHLORAPREP 3ML ORNG (MISCELLANEOUS) ×1 IMPLANT
BLADE ARTHRO LOK 4 BEAVER (BLADE) IMPLANT
BLADE SURG 15 STRL LF DISP TIS (BLADE) ×2 IMPLANT
BLADE SURG 15 STRL SS (BLADE) ×2
BNDG CMPR 5X3 KNIT ELC UNQ LF (GAUZE/BANDAGES/DRESSINGS)
BNDG CMPR 75X21 PLY HI ABS (MISCELLANEOUS) ×1
BNDG CMPR 9X4 STRL LF SNTH (GAUZE/BANDAGES/DRESSINGS) ×1
BNDG ELASTIC 3INX 5YD STR LF (GAUZE/BANDAGES/DRESSINGS) IMPLANT
BNDG ESMARK 4X9 LF (GAUZE/BANDAGES/DRESSINGS) ×1 IMPLANT
CORD BIPOLAR FORCEPS 12FT (ELECTRODE) ×1 IMPLANT
COVER BACK TABLE 60X90IN (DRAPES) ×1 IMPLANT
CUFF TOURN SGL QUICK 18X4 (TOURNIQUET CUFF) IMPLANT
DRAPE EXTREMITY T 121X128X90 (DISPOSABLE) ×1 IMPLANT
DRAPE IMP U-DRAPE 54X76 (DRAPES) ×1 IMPLANT
DRAPE U-SHAPE 47X51 STRL (DRAPES) IMPLANT
DRSG TELFA 3X8 NADH STRL (GAUZE/BANDAGES/DRESSINGS) IMPLANT
GAUZE SPONGE 4X4 12PLY STRL (GAUZE/BANDAGES/DRESSINGS) ×1 IMPLANT
GAUZE STRETCH 2X75IN STRL (MISCELLANEOUS) ×1 IMPLANT
GAUZE XEROFORM 1X8 LF (GAUZE/BANDAGES/DRESSINGS) ×1 IMPLANT
GLOVE BIO SURGEON STRL SZ7.5 (GLOVE) ×1 IMPLANT
GOWN STRL REUS W/ TWL LRG LVL3 (GOWN DISPOSABLE) ×2 IMPLANT
GOWN STRL REUS W/TWL LRG LVL3 (GOWN DISPOSABLE) ×2
NDL HYPO 25X1 1.5 SAFETY (NEEDLE) IMPLANT
NEEDLE HYPO 25X1 1.5 SAFETY (NEEDLE)
NS IRRIG 1000ML POUR BTL (IV SOLUTION) IMPLANT
PACK BASIN DAY SURGERY FS (CUSTOM PROCEDURE TRAY) ×1 IMPLANT
SHEET MEDIUM DRAPE 40X70 STRL (DRAPES) ×1 IMPLANT
SPIKE FLUID TRANSFER (MISCELLANEOUS) IMPLANT
STOCKINETTE IMPERVIOUS LG (DRAPES) IMPLANT
SUCTION TUBE FRAZIER 10FR DISP (SUCTIONS) IMPLANT
SUT ETHILON 4 0 PS 2 18 (SUTURE) IMPLANT
SUT VIC AB 3-0 SH 27 (SUTURE) ×1
SUT VIC AB 3-0 SH 27X BRD (SUTURE) IMPLANT
SYR BULB EAR ULCER 3OZ GRN STR (SYRINGE) IMPLANT
SYR CONTROL 10ML LL (SYRINGE) IMPLANT
TOWEL GREEN STERILE FF (TOWEL DISPOSABLE) ×2 IMPLANT
TUBE CONNECTING 20X1/4 (TUBING) IMPLANT

## 2022-11-28 NOTE — Anesthesia Preprocedure Evaluation (Signed)
Anesthesia Evaluation  Patient identified by MRN, date of birth, ID band Patient awake    Reviewed: Allergy & Precautions, H&P , NPO status , Patient's Chart, lab work & pertinent test results  History of Anesthesia Complications (+) PONV and history of anesthetic complications  Airway Mallampati: II  TM Distance: >3 FB Neck ROM: Full    Dental  (+) Teeth Intact, Dental Advisory Given   Pulmonary neg pulmonary ROS   Pulmonary exam normal breath sounds clear to auscultation       Cardiovascular hypertension, Pt. on medications negative cardio ROS Normal cardiovascular exam Rhythm:Regular Rate:Normal     Neuro/Psych negative neurological ROS  negative psych ROS   GI/Hepatic negative GI ROS, Neg liver ROS,,,  Endo/Other  negative endocrine ROSHypothyroidism  Morbid obesity  Renal/GU negative Renal ROS  negative genitourinary   Musculoskeletal negative musculoskeletal ROS (+)    Abdominal   Peds  Hematology  (+) Blood dyscrasia, anemia   Anesthesia Other Findings   Reproductive/Obstetrics                             Anesthesia Physical Anesthesia Plan  ASA: 3  Anesthesia Plan: General   Post-op Pain Management: Tylenol PO (pre-op)*, Celebrex PO (pre-op)* and Minimal or no pain anticipated   Induction: Intravenous  PONV Risk Score and Plan: 4 or greater and Ondansetron, Dexamethasone and Treatment may vary due to age or medical condition  Airway Management Planned: LMA  Additional Equipment: None  Intra-op Plan:   Post-operative Plan: Extubation in OR  Informed Consent: I have reviewed the patients History and Physical, chart, labs and discussed the procedure including the risks, benefits and alternatives for the proposed anesthesia with the patient or authorized representative who has indicated his/her understanding and acceptance.     Dental advisory given  Plan Discussed  with: CRNA and Anesthesiologist  Anesthesia Plan Comments:         Anesthesia Quick Evaluation

## 2022-11-28 NOTE — Interval H&P Note (Signed)
History and Physical Interval Note:  11/28/2022 12:12 PM  Lisa Horne  has presented today for surgery, with the diagnosis of RIGHT WRIST VOLAR GANGLION CYST.  The various methods of treatment have been discussed with the patient and family. After consideration of risks, benefits and other options for treatment, the patient has consented to  Procedure(s): RIGHT WRIST VOLAR GANGLION CYST EXCISION (Right) as a surgical intervention.  The patient's history has been reviewed, patient examined, no change in status, stable for surgery.  I have reviewed the patient's chart and labs.  Questions were answered to the patient's satisfaction.      

## 2022-11-28 NOTE — Anesthesia Procedure Notes (Signed)
Procedure Name: LMA Insertion Date/Time: 11/28/2022 12:35 PM  Performed by: Lauralyn Primes, CRNAPre-anesthesia Checklist: Patient identified, Emergency Drugs available, Suction available and Patient being monitored Patient Re-evaluated:Patient Re-evaluated prior to induction Oxygen Delivery Method: Circle system utilized Preoxygenation: Pre-oxygenation with 100% oxygen Induction Type: IV induction Ventilation: Mask ventilation without difficulty LMA: LMA inserted LMA Size: 4.0 Number of attempts: 1 Airway Equipment and Method: Bite block Placement Confirmation: positive ETCO2 Tube secured with: Tape Dental Injury: Teeth and Oropharynx as per pre-operative assessment

## 2022-11-28 NOTE — Discharge Instructions (Addendum)
Hand Surgery Postop Instructions   Dressings: Maintain postoperative dressing until orthopedic follow-up.  Keep operative site clean and dry until orthopedic follow-up.  Wound Care: Keep your hand elevated above the level of your heart.  Do not allow it to dangle by your side. Moving your fingers is advised to stimulate circulation but will depend on the site of your surgery.  If you have a splint applied, your doctor will advise you regarding movement.  Activity: Do not drive or operate machinery until clearance given from physician. No heavy lifting with operative extremity.  Diet:  Drink liquids today or eat a light diet.  You may resume a regular diet tomorrow.    General expectations: Take prescribed medication if given, transition to over-the-counter medication as quickly as possible. Fingers may become slightly swollen.  Call your doctor if any of the following occur: Severe pain not relieved by pain medication. Elevated temperature. Dressing soaked with blood. Inability to move fingers. White or bluish color to fingers.  You may have Tylenol again after 5:45pm today, if needed.   Post Anesthesia Home Care Instructions  Activity: Get plenty of rest for the remainder of the day. A responsible individual must stay with you for 24 hours following the procedure.  For the next 24 hours, DO NOT: -Drive a car -Advertising copywriter -Drink alcoholic beverages -Take any medication unless instructed by your physician -Make any legal decisions or sign important papers.  Meals: Start with liquid foods such as gelatin or soup. Progress to regular foods as tolerated. Avoid greasy, spicy, heavy foods. If nausea and/or vomiting occur, drink only clear liquids until the nausea and/or vomiting subsides. Call your physician if vomiting continues.  Special Instructions/Symptoms: Your throat may feel dry or sore from the anesthesia or the breathing tube placed in your throat during  surgery. If this causes discomfort, gargle with warm salt water. The discomfort should disappear within 24 hours.  If you had a scopolamine patch placed behind your ear for the management of post- operative nausea and/or vomiting:  1. The medication in the patch is effective for 72 hours, after which it should be removed.  Wrap patch in a tissue and discard in the trash. Wash hands thoroughly with soap and water. 2. You may remove the patch earlier than 72 hours if you experience unpleasant side effects which may include dry mouth, dizziness or visual disturbances. 3. Avoid touching the patch. Wash your hands with soap and water after contact with the patch.

## 2022-11-28 NOTE — Op Note (Addendum)
NAME: Lisa Horne MEDICAL RECORD NO: [id number] DATE OF BIRTH: October 06, 1975 FACILITY: [facility] LOCATION: [facility] PHYSICIAN: Samuella Cota, MD   OPERATIVE REPORT   DATE OF PROCEDURE: 11/28/22    PREOPERATIVE DIAGNOSIS: Right wrist volar ganglion cyst   POSTOPERATIVE DIAGNOSIS: Right wrist volar ganglion cyst   PROCEDURE: Excision volar ganglion cyst, right wrist Short arm splint application   SURGEON:  Samuella Cota, M.D.   ASSISTANT: None   ANESTHESIA:  General   INTRAVENOUS FLUIDS:  Per anesthesia flow sheet.   ESTIMATED BLOOD LOSS:  Minimal.   COMPLICATIONS:  None.   SPECIMENS: Cyst excised and sent for surgical pathology   TOURNIQUET TIME:    Total Tourniquet Time Documented: Upper Arm (Right) - 20 minutes Total: Upper Arm (Right) - 20 minutes    DISPOSITION:  Stable to PACU.   INDICATIONS: This is a 47 year old female who was seen in the outpatient setting and found to have clinical signs and symptoms consistent with a right wrist volar ganglion cyst.  She had undergone preoperative MRI as well which confirmed cyst location and size.  Risks and benefits of surgery were discussed including the risks of infection, bleeding, scarring, stiffness, nerve injury, vascular injury, tendon injury, need for subsequent operation, as well as possibility for recurrence.  She voiced understanding of these risks and elected to proceed.  OPERATIVE COURSE: Patient was seen and identified in the preoperative area and marked appropriately.  Surgical consent had been signed. Preoperative IV antibiotic prophylaxis was given. She was transferred to the operating room and placed in supine position with the Right upper extremity on an arm board.  General anesthesia was induced by the anesthesiologist.  Right upper extremity was prepped and draped in normal sterile orthopedic fashion.  A surgical pause was performed between the surgeons, anesthesia, and operating room  staff and all were in agreement as to the patient, procedure, and site of procedure.  Tourniquet was placed and padded appropriately.  Longitudinal incision was designed over the volar radial aspect of the wrist.  Incision was carried down utilizing a 15 blade.  Blunt dissection was performed, taking care to protect underlying neurovascular structures.  Radial artery was identified and protected in its entirety.  Cyst was carefully dissected and noted to be lobulated around the radial artery region, cyst carefully peeled off the vascular structures.  Stalk was then identified and traced down to the level of the radiocarpal joint.  Both cyst and its stalk were removed in its entirety and sent for surgical specimen.  The tourniquet was deflated at 20 minutes.  Fingertips were pink with brisk capillary refill after deflation of tourniquet.  Both radial and ulnar collateral circulation was noted to be appropriate at the wrist level.  Copious irrigation was performed.  Layered closure was performed lysing combination of 3-0 Vicryl for the subcutaneous layer and 4-0 nylon for the skin surface.  Sterile dressings were placed followed by application of a volar slab splint utilizing plaster.  The operative drapes were broken down.  The patient was awoken from anesthesia safely and taken to PACU in stable condition.  I will see her back in the office in  2 weeks  for postoperative followup.     Samuella Cota, MD Electronically signed, 11/28/22

## 2022-11-28 NOTE — Transfer of Care (Signed)
Immediate Anesthesia Transfer of Care Note  Patient: Lisa Horne  Procedure(s) Performed: RIGHT WRIST VOLAR GANGLION CYST EXCISION (Right: Wrist)  Patient Location: PACU  Anesthesia Type:General  Level of Consciousness: awake, alert , and oriented  Airway & Oxygen Therapy: Patient Spontanous Breathing and Patient connected to face mask oxygen  Post-op Assessment: Report given to RN and Post -op Vital signs reviewed and stable  Post vital signs: Reviewed and stable  Last Vitals:  Vitals Value Taken Time  BP 125/68 11/28/22 1334  Temp    Pulse 101 11/28/22 1339  Resp 26 11/28/22 1339  SpO2 99 % 11/28/22 1339  Vitals shown include unfiled device data.  Last Pain:  Vitals:   11/28/22 1141  TempSrc: Oral  PainSc: 0-No pain      Patients Stated Pain Goal: 3 (11/28/22 1141)  Complications: No notable events documented.

## 2022-11-29 NOTE — Anesthesia Postprocedure Evaluation (Signed)
Anesthesia Post Note  Patient: Lisa Horne  Procedure(s) Performed: RIGHT WRIST VOLAR GANGLION CYST EXCISION (Right: Wrist)     Patient location during evaluation: PACU Anesthesia Type: General Level of consciousness: awake and alert Pain management: pain level controlled Vital Signs Assessment: post-procedure vital signs reviewed and stable Respiratory status: spontaneous breathing, nonlabored ventilation, respiratory function stable and patient connected to nasal cannula oxygen Cardiovascular status: blood pressure returned to baseline and stable Postop Assessment: no apparent nausea or vomiting Anesthetic complications: no   No notable events documented.  Last Vitals:  Vitals:   11/28/22 1430 11/28/22 1439  BP: 126/75 120/67  Pulse: 62 69  Resp: 17 18  Temp:  (!) 36.1 C  SpO2: 99% 99%    Last Pain:  Vitals:   11/28/22 1439  TempSrc:   PainSc: 2                  ,

## 2022-12-01 ENCOUNTER — Other Ambulatory Visit: Payer: Self-pay

## 2022-12-01 ENCOUNTER — Other Ambulatory Visit: Payer: Self-pay | Admitting: Family Medicine

## 2022-12-01 ENCOUNTER — Encounter (HOSPITAL_BASED_OUTPATIENT_CLINIC_OR_DEPARTMENT_OTHER): Payer: Self-pay | Admitting: Orthopedic Surgery

## 2022-12-01 ENCOUNTER — Other Ambulatory Visit (HOSPITAL_COMMUNITY): Payer: Self-pay

## 2022-12-02 ENCOUNTER — Other Ambulatory Visit (HOSPITAL_COMMUNITY): Payer: Self-pay

## 2022-12-02 MED ORDER — IBUPROFEN 800 MG PO TABS
800.0000 mg | ORAL_TABLET | Freq: Three times a day (TID) | ORAL | 5 refills | Status: DC | PRN
  Filled 2022-12-02: qty 90, 30d supply, fill #0

## 2022-12-06 ENCOUNTER — Other Ambulatory Visit (HOSPITAL_COMMUNITY): Payer: Self-pay

## 2022-12-12 ENCOUNTER — Ambulatory Visit (INDEPENDENT_AMBULATORY_CARE_PROVIDER_SITE_OTHER): Payer: Commercial Managed Care - PPO | Admitting: Orthopedic Surgery

## 2022-12-12 DIAGNOSIS — M25531 Pain in right wrist: Secondary | ICD-10-CM | POA: Diagnosis not present

## 2022-12-12 DIAGNOSIS — M67431 Ganglion, right wrist: Secondary | ICD-10-CM

## 2022-12-12 NOTE — Progress Notes (Signed)
Lisa Horne - 47 y.o. female MRN [id number]  Date of birth: [birth year]  Office Visit Note: Visit Date: 12/12/2022 PCP: Nelwyn Salisbury, MD Referred by: Nelwyn Salisbury, MD  Subjective:  HPI: Lisa Horne is a 47 y.o. female who presents today for follow up 2 weeks status post right volar ganglion cyst excision on right wrist.  Pertinent ROS were reviewed with the patient and found to be negative unless otherwise specified above in HPI.   Assessment & Plan: Visit Diagnoses: No diagnosis found.  Plan: She is doing well postop.  Surgical pathology was reviewed today which is consistent with a ganglion cyst.  Sutures removed today, transition to a removable wrist brace.  In 2 weeks, she will return for routine follow-up at that time she can begin therapy for range of motion activities with progression of strengthening as tolerated.  Follow-up: Return in about 2 weeks (around 12/26/2022).   Meds & Orders: No orders of the defined types were placed in this encounter.  No orders of the defined types were placed in this encounter.    Procedures: No procedures performed       Objective:   Vital Signs: There were no vitals taken for this visit.  Ortho Exam Right wrist: - Well-healing volar incision, sutures removed today without incident - Wrist range of motion well-preserved, flexion/extension 65/55, full composite fist without restriction - Thumb range of motion intact, thumb opposition to small finger [facility] - Sensation intact in all distributions - Normal color and capillary refill throughout the hand, Allen's testing demonstrates appropriate flow to radial and ulnar arteries at the wrist  Imaging: No results found.  Past Medical/Family/Surgical/Social History: Medications & Allergies reviewed per EMR, new medications updated. 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 06/13/2011   BACK PAIN, LUMBAR 04/30/2010   SPRAIN&STRAIN OF UNSPECIFIED SITE OF HIP&THIGH 11/09/2009   Hypothyroidism 12/02/2006   Essential hypertension 12/02/2006   Past Medical History:  Diagnosis Date   Allergy    Anemia    GERD (gastroesophageal reflux disease)    Hyperlipemia    PCOS (polycystic ovarian syndrome)    PONV (postoperative nausea and vomiting)    Prediabetes    Thyroid disease    hypothyroidism   Family History  Problem Relation Age of Onset   Breast cancer Mother    Hypertension Mother    Cancer Mother        breast   Depression Mother    Diabetes Father    Cancer Father        skin   Colon polyps Sister    Colon cancer Sister    Depression Sister    Colon cancer Maternal Uncle    Esophageal cancer Neg Hx    Rectal cancer Neg Hx    Stomach cancer Neg Hx    Past Surgical History:  Procedure Laterality Date   COLONOSCOPY  03/11/2021   per Dr. Leone Payor, diverticulosis, no polyps, repeat in 10 yrs   CYST EXCISION Right 11/28/2022   Procedure: RIGHT WRIST VOLAR GANGLION CYST EXCISION;  Surgeon: Samuella Cota, MD;  Location: [facility];  Service: Orthopedics;  Laterality: Right;   GASTRIC  BANDING PORT REVISION     GASTRIC RESTRICTION SURGERY     lap band 2005   LAPAROSCOPIC GASTRIC SLEEVE RESECTION     Social History   Occupational History   Not on file  Tobacco Use   Smoking status: Never   Smokeless tobacco: Never  Vaping Use   Vaping status: Never Used  Substance and Sexual Activity   Alcohol use: No    Alcohol/week: 0.0 standard drinks of alcohol   Drug use: No   Sexual activity: Yes    Birth control/protection: I.U.D.    Liel Rudden Trevor Mace, M.D. [facility] 9:47 AM

## 2022-12-12 NOTE — Addendum Note (Signed)
Addended by: Glynn Octave C on: 12/12/2022 10:04 AM   Modules accepted: Orders

## 2022-12-16 DIAGNOSIS — M67431 Ganglion, right wrist: Secondary | ICD-10-CM

## 2022-12-18 ENCOUNTER — Other Ambulatory Visit (HOSPITAL_COMMUNITY): Payer: Self-pay

## 2022-12-26 ENCOUNTER — Encounter: Payer: Commercial Managed Care - PPO | Admitting: Rehabilitative and Restorative Service Providers"

## 2022-12-29 ENCOUNTER — Other Ambulatory Visit: Payer: Self-pay

## 2022-12-29 ENCOUNTER — Ambulatory Visit (INDEPENDENT_AMBULATORY_CARE_PROVIDER_SITE_OTHER): Payer: Commercial Managed Care - PPO | Admitting: Orthopedic Surgery

## 2022-12-29 ENCOUNTER — Encounter: Payer: Self-pay | Admitting: Rehabilitative and Restorative Service Providers"

## 2022-12-29 ENCOUNTER — Ambulatory Visit: Payer: Commercial Managed Care - PPO | Admitting: Rehabilitative and Restorative Service Providers"

## 2022-12-29 ENCOUNTER — Ambulatory Visit: Payer: Commercial Managed Care - PPO | Admitting: Physical Therapy

## 2022-12-29 DIAGNOSIS — R278 Other lack of coordination: Secondary | ICD-10-CM

## 2022-12-29 DIAGNOSIS — M6281 Muscle weakness (generalized): Secondary | ICD-10-CM

## 2022-12-29 DIAGNOSIS — M25631 Stiffness of right wrist, not elsewhere classified: Secondary | ICD-10-CM

## 2022-12-29 DIAGNOSIS — M67431 Ganglion, right wrist: Secondary | ICD-10-CM

## 2022-12-29 NOTE — Therapy (Signed)
OUTPATIENT OCCUPATIONAL THERAPY ORTHO EVALUATION  Patient Name: Lisa Horne MRN: [id number] DOB:[birth year], 47 y.o., female Today's Date: 12/29/2022  PCP: Gershon Crane, MD REFERRING PROVIDER: Samuella Cota, MD   END OF SESSION:  OT End of Session - 12/29/22 1308     Visit Number 1    Number of Visits 1    Authorization Type [employer]    OT Start Time [phone]    OT Stop Time [phone]    OT Time Calculation (min) 38 min    Activity Tolerance Patient tolerated treatment well;No increased pain;Patient limited by pain    Behavior During Therapy [facility] for tasks assessed/performed             Past Medical History:  Diagnosis Date   Allergy    Anemia    GERD (gastroesophageal reflux disease)    Hyperlipemia    PCOS (polycystic ovarian syndrome)    PONV (postoperative nausea and vomiting)    Prediabetes    Thyroid disease    hypothyroidism   Past Surgical History:  Procedure Laterality Date   COLONOSCOPY  03/11/2021   per Dr. Leone Payor, diverticulosis, no polyps, repeat in 10 yrs   CYST EXCISION Right 11/28/2022   Procedure: RIGHT WRIST VOLAR GANGLION CYST EXCISION;  Surgeon: Samuella Cota, MD;  Location: [facility];  Service: Orthopedics;  Laterality: Right;   GASTRIC BANDING PORT REVISION     GASTRIC RESTRICTION SURGERY     lap band 2005   LAPAROSCOPIC GASTRIC SLEEVE RESECTION     Patient Active Problem List   Diagnosis Date Noted   Ganglion cyst of volar aspect of right wrist 12/16/2022   Aortic atherosclerosis (HCC) 05/19/2022   Prediabetes 06/27/2021   B12 deficiency 06/06/2020   Iron deficiency anemia 06/06/2020   GERD (gastroesophageal reflux disease) 04/24/2014   Fever blister 04/24/2014   Obesity 12/10/2011   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 She states she had 5-6 months of pain and numbness in Rt thumb, wrist from a cyst. This was recently removed and she is doing well with minimal pain now. Numbness improved but still present, after a day of work she is a bit sore.    PERTINENT HISTORY: Per MD note 12/12/22: "47 y.o. female who presents today for follow up 2 weeks status post right volar ganglion cyst excision on right wrist..Marland KitchenS/P Volar Cyst removal, Appointment needs to be 4 weeks from surgery ; DOS is 11/28/22-- (12/19/22), -wrist ROM- 1 visit is ok then progress to HEP "   PRECAUTIONS: None  RED FLAGS: None   WEIGHT BEARING RESTRICTIONS: No but caution with painful activities or repetitive sports until 3 months post-op  PAIN:  Are you having pain? Yes: NPRS scale: 1/10 at rest now and in past week at worst up to 4/10 Pain location: Rt volar wrist Pain description: mild ache, some numbness in thumb Rt Aggravating factors: working all day Relieving factors:  rest  FALLS: Has patient fallen in last 6 months? No  LIVING ENVIRONMENT: Lives with: lives with their family  PLOF: Independent  PATIENT GOALS: to have no pain and full ability     OBJECTIVE: (All objective assessments below are from initial evaluation on: 12/29/22 unless otherwise specified.)  HAND DOMINANCE: Right   ADLs: Overall ADLs: States decreased ability to grab, hold household objects, pain and difficulty to open containers, perform FMS tasks (manipulate fasteners on clothing), mild to moderate bathing problems as well.     FUNCTIONAL OUTCOME MEASURES: Eval: Quick DASH 30% impairment today  (Higher % Score  =  More Impairment)     UPPER EXTREMITY ROM     Shoulder to Wrist AROM Right eval  Wrist flexion 49  Wrist extension 71  (Blank rows = not tested)   Hand AROM Right eval  Full Fist Ability (or Gap to Distal Palmar Crease) Full fist   Thumb Opposition  (Kapandji Scale)  9/10   (Blank rows = not tested)   UPPER EXTREMITY MMT:     MMT Right 12/29/22  Wrist flexion 4+/5  Wrist extension 4/5  (Blank rows = not tested)  HAND FUNCTION: Eval: Observed weakness in affected Rt hand.  Grip strength Right: 50 lbs, Left: 70 lbs   COORDINATION: Eval: No significant observed coordination impairments with affected Rt hand.  SENSATION: Eval: S2PD Rt thumb 16m, Rt IF 6mm, Rt SF 5mm.  Light touch intact today, though diminished in Rt med nerve distribution   EDEMA:   Eval: none   COGNITION: Eval: Overall cognitive status: WFL for evaluation today   OBSERVATIONS:   Eval: scar well healed, mildly sensitive, mild median nerve paresthesia, some stiffness and mild weakness, improving overall,    TODAY'S TREATMENT:  Post-evaluation treatment:  She was given education on the following treatments to improve nerve health and paresthesia, range of motion and strength in the right hand and arm.  She had no pain or problems with this and was given a printed copy for home.  She was also advised to continue with her wrist brace in the night only, if she has any paresthesia in the night.  She should avoid bent wrist postures and repetitive motions for 2-3 more months.  She states understanding  Exercises - Seated Wrist Flexion Stretch  - 4-5 x daily - 3 reps - 10-15 second  hold - Wrist Prayer Stretch  - 4 x daily - 3-5 reps - 15 sec hold - Thumb Opposition  - 4-5 x daily - 10 reps - Full Fist  - 4-5 x daily - 5 reps - 10 sec hold - Median Nerve Flossing  - 2-3 x daily - 5-10 reps - Seated Median Nerve  Glide  - 3-4 x daily - 5 reps  Patient Education - Scar Massage    PATIENT EDUCATION: Education details: See tx section above for details  Person educated: Patient Education method: Verbal Instruction, Teach back, Handouts  Education comprehension: States and demonstrates understanding, Additional Education required    HOME EXERCISE PROGRAM: Access Code: [email or web address] URL: [email or web address] Date: 12/29/2022 Prepared by: Fannie Knee   GOALS: Goals reviewed with patient? Yes   SHORT TERM GOALS: (STG required if POC>30 days) Target Date: 12/29/22  1.  Pt will demo/state understanding of initial HEP to improve pain levels, motion, ability. Goal status: MET    ASSESSMENT:  CLINICAL IMPRESSION: Patient is a 47 y.o. female who was seen today for occupational therapy evaluation for R wrist stiffness  and weakness s/p R volar wrist ganglion cyst removal.  She will benefit from therapeutic treatment today to obtain a home exercise program which she can work on and should be effective at resolving all of her issues.  If she has any continued pain or numbness in an additional month, she was educated to return to Dr. Nelly Laurence office and possibly get a new order for therapy.  She was also given this therapist's email which she can utilize to ask questions if needed.  PERFORMANCE DEFICITS: in functional skills including IADLs, sensation, ROM, strength, pain, flexibility, body mechanics, endurance, and UE functional use and psychosocial skills including habits.   IMPAIRMENTS: are limiting patient from IADLs and work.   COMORBIDITIES: may have co-morbidities  that affects occupational performance. Patient will benefit from skilled OT to address above impairments and improve overall function.  MODIFICATION OR ASSISTANCE TO COMPLETE EVALUATION: No modification of tasks or assist necessary to complete an evaluation.  OT OCCUPATIONAL PROFILE AND HISTORY: Problem focused  assessment: Including review of records relating to presenting problem.  CLINICAL DECISION MAKING: LOW - limited treatment options, no task modification necessary  REHAB POTENTIAL: Excellent  EVALUATION COMPLEXITY: Low      PLAN:  OT FREQUENCY: one time visit on 12/29/22  PLANNED INTERVENTIONS: self care/ADL training, therapeutic exercise, neuromuscular re-education, scar mobilization, moist heat, patient/family education, energy conservation, and coping strategies training  RECOMMENDED OTHER SERVICES: none   CONSULTED AND AGREED WITH PLAN OF CARE: Patient  PLAN FOR NEXT SESSION: N/A she will d/c today after eval and treatment    Fannie Knee, OTR/L, CHT 12/29/2022, 9:37 AM

## 2022-12-29 NOTE — Progress Notes (Signed)
   Lisa Horne - 47 y.o. female MRN [id number]  Date of birth: 1976-02-09  Office Visit Note: Visit Date: 12/29/2022 PCP: Nelwyn Salisbury, MD Referred by: Nelwyn Salisbury, MD  Subjective:  HPI: Lisa Horne is a 47 y.o. female who presents today for follow up 4 weeks status post right volar ganglion cyst removal.  She is doing very well overall, pain is well-controlled.  No signs of recurrence.  Pertinent ROS were reviewed with the patient and found to be negative unless otherwise specified above in HPI.   Assessment & Plan: Visit Diagnoses: No diagnosis found.  Plan: Begin therapy for range of motion and strengthening as tolerated.  Can follow-up with me in approximate 2 months time for recheck or as needed.  Discussion was once again had with the patient regarding potential recurrence in the future and possible intervention should this occur.  She expressed understanding.  Follow-up: No follow-ups on file.   Meds & Orders: No orders of the defined types were placed in this encounter.  No orders of the defined types were placed in this encounter.    Procedures: No procedures performed       Objective:   Vital Signs: There were no vitals taken for this visit.  Ortho Exam Right wrist: - Well-healed volar incision, no erythema or drainage - Wrist range of motion flexion/extension 55/45, full pronation and supination, composite fist without restriction - No palpable mass  Imaging: No results found.   Lisa Horne, M.D. [facility] 8:07 AM

## 2023-02-17 ENCOUNTER — Other Ambulatory Visit: Payer: Self-pay | Admitting: Family Medicine

## 2023-02-17 ENCOUNTER — Ambulatory Visit (INDEPENDENT_AMBULATORY_CARE_PROVIDER_SITE_OTHER): Payer: Commercial Managed Care - PPO | Admitting: Family Medicine

## 2023-02-17 ENCOUNTER — Other Ambulatory Visit (HOSPITAL_COMMUNITY): Payer: Self-pay

## 2023-02-17 VITALS — BP 98/70 | HR 71 | Temp 98.3°F | Ht 69.0 in | Wt 205.8 lb

## 2023-02-17 DIAGNOSIS — E538 Deficiency of other specified B group vitamins: Secondary | ICD-10-CM | POA: Diagnosis not present

## 2023-02-17 DIAGNOSIS — M791 Myalgia, unspecified site: Secondary | ICD-10-CM

## 2023-02-17 DIAGNOSIS — E039 Hypothyroidism, unspecified: Secondary | ICD-10-CM | POA: Diagnosis not present

## 2023-02-17 DIAGNOSIS — Z0000 Encounter for general adult medical examination without abnormal findings: Secondary | ICD-10-CM | POA: Diagnosis not present

## 2023-02-17 DIAGNOSIS — Z780 Asymptomatic menopausal state: Secondary | ICD-10-CM

## 2023-02-17 DIAGNOSIS — R7303 Prediabetes: Secondary | ICD-10-CM

## 2023-02-17 DIAGNOSIS — D509 Iron deficiency anemia, unspecified: Secondary | ICD-10-CM

## 2023-02-17 DIAGNOSIS — E559 Vitamin D deficiency, unspecified: Secondary | ICD-10-CM | POA: Diagnosis not present

## 2023-02-17 MED ORDER — FERROUS SULFATE 325 (65 FE) MG PO TBEC
1.0000 | DELAYED_RELEASE_TABLET | Freq: Two times a day (BID) | ORAL | 3 refills | Status: DC
  Filled 2023-02-17: qty 180, 90d supply, fill #0
  Filled 2023-02-18: qty 105, 53d supply, fill #0

## 2023-02-17 MED ORDER — FAMOTIDINE 40 MG PO TABS
40.0000 mg | ORAL_TABLET | Freq: Two times a day (BID) | ORAL | 3 refills | Status: DC
  Filled 2023-02-17 – 2024-01-14 (×3): qty 180, 90d supply, fill #0

## 2023-02-17 MED ORDER — VALACYCLOVIR HCL 500 MG PO TABS
500.0000 mg | ORAL_TABLET | Freq: Two times a day (BID) | ORAL | 5 refills | Status: DC
  Filled 2023-02-17 – 2024-01-14 (×3): qty 60, 30d supply, fill #0

## 2023-02-17 MED ORDER — CELECOXIB 200 MG PO CAPS
200.0000 mg | ORAL_CAPSULE | Freq: Two times a day (BID) | ORAL | 3 refills | Status: DC
  Filled 2023-02-17 – 2023-03-04 (×3): qty 180, 90d supply, fill #0
  Filled 2023-08-22 – 2023-10-24 (×5): qty 180, 90d supply, fill #1
  Filled 2024-01-14 – 2024-02-17 (×2): qty 180, 90d supply, fill #2

## 2023-02-17 MED ORDER — WEGOVY 2.4 MG/0.75ML ~~LOC~~ SOAJ
2.4000 mg | SUBCUTANEOUS | 3 refills | Status: DC
  Filled 2023-02-17: qty 3, 28d supply, fill #0

## 2023-02-17 MED ORDER — ATORVASTATIN CALCIUM 10 MG PO TABS
10.0000 mg | ORAL_TABLET | Freq: Every day | ORAL | 3 refills | Status: DC
  Filled 2023-02-17 – 2023-03-04 (×2): qty 90, 90d supply, fill #0
  Filled 2023-08-22 – 2023-09-16 (×2): qty 90, 90d supply, fill #1

## 2023-02-17 MED ORDER — LEVOTHYROXINE SODIUM 175 MCG PO TABS
175.0000 ug | ORAL_TABLET | Freq: Every day | ORAL | 3 refills | Status: DC
Start: 2023-02-17 — End: 2023-02-20
  Filled 2023-02-17: qty 90, 90d supply, fill #0

## 2023-02-17 MED ORDER — CYANOCOBALAMIN 1000 MCG/ML IJ SOLN
1000.0000 ug | INTRAMUSCULAR | 11 refills | Status: AC
  Filled 2023-02-17 – 2024-02-17 (×6): qty 10, 70d supply, fill #0

## 2023-02-17 NOTE — Progress Notes (Signed)
Subjective:    Patient ID: Lisa Horne, female    DOB: 10/23/1975, 47 y.o.   MRN: [id number]   Here for a well exam. She complains of generalized fatigue as well as diffuse muscle aches. She thinks she is in menopause because she has hot flashes and her moods can be irritable. She is slowly losing weight by taking Weygovy shots.    Review of Systems  Constitutional:  Positive for fatigue.  HENT: Negative.    Eyes: Negative.   Respiratory: Negative.    Cardiovascular: Negative.   Gastrointestinal: Negative.   Genitourinary:  Negative for decreased urine volume, difficulty urinating, dyspareunia, dysuria, enuresis, flank pain, frequency, hematuria, pelvic pain and urgency.  Musculoskeletal:  Positive for arthralgias and myalgias.  Skin: Negative.   Neurological: Negative.  Negative for headaches.  Psychiatric/Behavioral: Negative.         Objective:   Physical Exam Constitutional:      General: She is not in acute distress.    Appearance: Normal appearance. She is well-developed.  HENT:     Head: Normocephalic and atraumatic.     Right Ear: External ear normal.     Left Ear: External ear normal.     Nose: Nose normal.     Mouth/Throat:     Pharynx: No oropharyngeal exudate.  Eyes:     General: No scleral icterus.    Conjunctiva/sclera: Conjunctivae normal.     Pupils: Pupils are equal, round, and reactive to light.  Neck:     Thyroid: No thyromegaly.     Vascular: No JVD.  Cardiovascular:     Rate and Rhythm: Normal rate and regular rhythm.     Pulses: Normal pulses.     Heart sounds: Normal heart sounds. No murmur heard.    No friction rub. No gallop.  Pulmonary:     Effort: Pulmonary effort is normal. No respiratory distress.     Breath sounds: Normal breath sounds. No wheezing or rales.  Chest:     Chest wall: No tenderness.  Abdominal:     General: Bowel sounds are normal. There is no distension.     Palpations: Abdomen is soft. There is no mass.      Tenderness: There is no abdominal tenderness. There is no guarding or rebound.  Musculoskeletal:        General: No tenderness. Normal range of motion.     Cervical back: Normal range of motion and neck supple.  Lymphadenopathy:     Cervical: No cervical adenopathy.  Skin:    General: Skin is warm and dry.     Findings: No erythema or rash.  Neurological:     General: No focal deficit present.     Mental Status: She is alert and oriented to person, place, and time.     Cranial Nerves: No cranial nerve deficit.     Motor: No abnormal muscle tone.     Coordination: Coordination normal.     Deep Tendon Reflexes: Reflexes are normal and symmetric. Reflexes normal.  Psychiatric:        Mood and Affect: Mood normal.        Behavior: Behavior normal.        Thought Content: Thought content normal.        Judgment: Judgment normal.           Assessment & Plan:  Well exam. We discussed diet and exercise. Get fasting labs. Per her request we are checking numerous hormone levels. For her  myalgias, we will stop the Ibuprofen and instead she will try taking Celebrex 200 mg BID. Gershon Crane, MD

## 2023-02-18 ENCOUNTER — Other Ambulatory Visit (INDEPENDENT_AMBULATORY_CARE_PROVIDER_SITE_OTHER): Payer: Commercial Managed Care - PPO

## 2023-02-18 ENCOUNTER — Other Ambulatory Visit (HOSPITAL_COMMUNITY): Payer: Self-pay

## 2023-02-18 ENCOUNTER — Other Ambulatory Visit: Payer: Self-pay

## 2023-02-18 DIAGNOSIS — Z780 Asymptomatic menopausal state: Secondary | ICD-10-CM

## 2023-02-18 DIAGNOSIS — M791 Myalgia, unspecified site: Secondary | ICD-10-CM | POA: Diagnosis not present

## 2023-02-18 DIAGNOSIS — Z0000 Encounter for general adult medical examination without abnormal findings: Secondary | ICD-10-CM

## 2023-02-18 DIAGNOSIS — E559 Vitamin D deficiency, unspecified: Secondary | ICD-10-CM

## 2023-02-18 DIAGNOSIS — E039 Hypothyroidism, unspecified: Secondary | ICD-10-CM

## 2023-02-18 DIAGNOSIS — E538 Deficiency of other specified B group vitamins: Secondary | ICD-10-CM

## 2023-02-18 DIAGNOSIS — D509 Iron deficiency anemia, unspecified: Secondary | ICD-10-CM | POA: Diagnosis not present

## 2023-02-18 DIAGNOSIS — R7303 Prediabetes: Secondary | ICD-10-CM | POA: Diagnosis not present

## 2023-02-18 LAB — CBC WITH DIFFERENTIAL/PLATELET
Basophils Absolute: 0.1 10*3/uL (ref 0.0–0.1)
Basophils Relative: 1.2 % (ref 0.0–3.0)
Eosinophils Absolute: 0.1 10*3/uL (ref 0.0–0.7)
Eosinophils Relative: 1.4 % (ref 0.0–5.0)
HCT: 38.7 % (ref 36.0–46.0)
Hemoglobin: 12.4 g/dL (ref 12.0–15.0)
Lymphocytes Relative: 31 % (ref 12.0–46.0)
Lymphs Abs: 1.8 10*3/uL (ref 0.7–4.0)
MCHC: 31.9 g/dL (ref 30.0–36.0)
MCV: 81.8 fL (ref 78.0–100.0)
Monocytes Absolute: 0.6 10*3/uL (ref 0.1–1.0)
Monocytes Relative: 10.9 % (ref 3.0–12.0)
Neutro Abs: 3.3 10*3/uL (ref 1.4–7.7)
Neutrophils Relative %: 55.5 % (ref 43.0–77.0)
Platelets: 312 10*3/uL (ref 150.0–400.0)
RBC: 4.73 Mil/uL (ref 3.87–5.11)
RDW: 14.1 % (ref 11.5–15.5)
WBC: 5.9 10*3/uL (ref 4.0–10.5)

## 2023-02-18 LAB — BASIC METABOLIC PANEL
BUN: 19 mg/dL (ref 6–23)
CO2: 26 meq/L (ref 19–32)
Calcium: 9.4 mg/dL (ref 8.4–10.5)
Chloride: 103 meq/L (ref 96–112)
Creatinine, Ser: 0.93 mg/dL (ref 0.40–1.20)
GFR: 72.03 mL/min (ref >60.00)
Glucose, Bld: 86 mg/dL (ref 70–99)
Potassium: 4.2 meq/L (ref 3.5–5.1)
Sodium: 139 meq/L (ref 135–145)

## 2023-02-18 LAB — TSH: TSH: 6.77 u[IU]/mL — ABNORMAL HIGH (ref 0.35–5.50)

## 2023-02-18 LAB — LIPID PANEL
Cholesterol: 215 mg/dL — ABNORMAL HIGH (ref 0–200)
HDL: 51.4 mg/dL (ref >39.00)
LDL Cholesterol: 152 mg/dL — ABNORMAL HIGH (ref 0–99)
NonHDL: 163.38
Total CHOL/HDL Ratio: 4
Triglycerides: 57 mg/dL (ref 0.0–149.0)
VLDL: 11.4 mg/dL (ref 0.0–40.0)

## 2023-02-18 LAB — TESTOSTERONE: Testosterone: 23.26 ng/dL (ref 15.00–40.00)

## 2023-02-18 LAB — FOLLICLE STIMULATING HORMONE: FSH: 65.6 m[IU]/mL

## 2023-02-18 LAB — IBC + FERRITIN
Ferritin: 7.1 ng/mL — ABNORMAL LOW (ref 10.0–291.0)
Iron: 99 ug/dL (ref 42–145)
Saturation Ratios: 22.3 % (ref 20.0–50.0)
TIBC: 443.8 ug/dL (ref 250.0–450.0)
Transferrin: 317 mg/dL (ref 212.0–360.0)

## 2023-02-18 LAB — HEPATIC FUNCTION PANEL
ALT: 9 U/L (ref 0–35)
AST: 14 U/L (ref 0–37)
Albumin: 4 g/dL (ref 3.5–5.2)
Alkaline Phosphatase: 86 U/L (ref 39–117)
Bilirubin, Direct: 0.2 mg/dL (ref 0.0–0.3)
Total Bilirubin: 0.8 mg/dL (ref 0.2–1.2)
Total Protein: 7 g/dL (ref 6.0–8.3)

## 2023-02-18 LAB — VITAMIN B12: Vitamin B-12: 360 pg/mL (ref 211–911)

## 2023-02-18 LAB — T3, FREE: T3, Free: 3.2 pg/mL (ref 2.3–4.2)

## 2023-02-18 LAB — T4, FREE: Free T4: 0.79 ng/dL (ref 0.60–1.60)

## 2023-02-18 LAB — VITAMIN D 25 HYDROXY (VIT D DEFICIENCY, FRACTURES): VITD: 16.36 ng/mL — ABNORMAL LOW (ref 30.00–100.00)

## 2023-02-18 LAB — HEMOGLOBIN A1C: Hgb A1c MFr Bld: 5.5 % (ref 4.6–6.5)

## 2023-02-18 LAB — MAGNESIUM: Magnesium: 1.9 mg/dL (ref 1.5–2.5)

## 2023-02-18 LAB — LUTEINIZING HORMONE: LH: 55.43 m[IU]/mL

## 2023-02-18 LAB — CORTISOL: Cortisol, Plasma: 13.2 ug/dL

## 2023-02-18 LAB — FOLATE: Folate: 10.8 ng/mL (ref >5.9)

## 2023-02-18 NOTE — Telephone Encounter (Signed)
Pt LOV was 02/17/23 Last refill done on 07/07/22 Please advise

## 2023-02-19 ENCOUNTER — Other Ambulatory Visit: Payer: Self-pay

## 2023-02-19 ENCOUNTER — Other Ambulatory Visit (HOSPITAL_COMMUNITY): Payer: Self-pay

## 2023-02-19 MED ORDER — PHENTERMINE HCL 37.5 MG PO CAPS
37.5000 mg | ORAL_CAPSULE | Freq: Every day | ORAL | 5 refills | Status: DC
Start: 2023-02-19 — End: 2023-08-22
  Filled 2023-02-19 – 2023-03-04 (×2): qty 30, 30d supply, fill #0

## 2023-02-20 ENCOUNTER — Other Ambulatory Visit (HOSPITAL_COMMUNITY): Payer: Self-pay

## 2023-02-20 ENCOUNTER — Other Ambulatory Visit: Payer: Self-pay

## 2023-02-20 MED ORDER — LEVOTHYROXINE SODIUM 200 MCG PO TABS
200.0000 ug | ORAL_TABLET | Freq: Every day | ORAL | 3 refills | Status: DC
  Filled 2023-02-20 – 2023-03-04 (×2): qty 90, 90d supply, fill #0
  Filled 2023-08-22 – 2023-10-24 (×4): qty 90, 90d supply, fill #1
  Filled 2024-01-14 – 2024-02-17 (×2): qty 90, 90d supply, fill #2

## 2023-02-21 ENCOUNTER — Other Ambulatory Visit (HOSPITAL_COMMUNITY): Payer: Self-pay

## 2023-02-23 ENCOUNTER — Other Ambulatory Visit (HOSPITAL_COMMUNITY): Payer: Self-pay

## 2023-03-02 ENCOUNTER — Other Ambulatory Visit (HOSPITAL_COMMUNITY): Payer: Self-pay

## 2023-03-04 ENCOUNTER — Other Ambulatory Visit: Payer: Self-pay

## 2023-03-04 ENCOUNTER — Other Ambulatory Visit (HOSPITAL_COMMUNITY): Payer: Self-pay

## 2023-03-05 ENCOUNTER — Other Ambulatory Visit (HOSPITAL_COMMUNITY): Payer: Self-pay

## 2023-03-08 LAB — ESTRADIOL: Estradiol: 15 pg/mL

## 2023-03-08 LAB — PROLACTIN: Prolactin: 10 ng/mL

## 2023-03-08 LAB — EXTRA SPECIMEN

## 2023-03-08 LAB — DHEA-SULFATE: DHEA-SO4: 116 ug/dL (ref 15–205)

## 2023-03-08 LAB — PROGESTERONE: Progesterone: <0.5 ng/mL

## 2023-03-08 LAB — INSULIN, FREE (BIOACTIVE): Insulin, Free: 6.9 u[IU]/mL (ref 1.5–14.9)

## 2023-03-08 LAB — B. BURGDORFI ANTIBODIES: B burgdorferi Ab IgG+IgM: <0.9 {index}

## 2023-03-09 ENCOUNTER — Telehealth: Payer: Commercial Managed Care - PPO | Admitting: Physician Assistant

## 2023-03-09 ENCOUNTER — Other Ambulatory Visit (HOSPITAL_COMMUNITY): Payer: Self-pay

## 2023-03-09 DIAGNOSIS — J208 Acute bronchitis due to other specified organisms: Secondary | ICD-10-CM

## 2023-03-09 DIAGNOSIS — B9689 Other specified bacterial agents as the cause of diseases classified elsewhere: Secondary | ICD-10-CM

## 2023-03-09 MED ORDER — DOXYCYCLINE HYCLATE 100 MG PO TABS
100.0000 mg | ORAL_TABLET | Freq: Two times a day (BID) | ORAL | 0 refills | Status: DC
  Filled 2023-03-09: qty 14, 7d supply, fill #0

## 2023-03-09 MED ORDER — PREDNISONE 20 MG PO TABS
40.0000 mg | ORAL_TABLET | Freq: Every day | ORAL | 0 refills | Status: DC
  Filled 2023-03-09: qty 10, 5d supply, fill #0

## 2023-03-09 MED ORDER — BENZONATATE 100 MG PO CAPS
100.0000 mg | ORAL_CAPSULE | Freq: Three times a day (TID) | ORAL | 0 refills | Status: DC | PRN
  Filled 2023-03-09: qty 30, 5d supply, fill #0

## 2023-03-09 NOTE — Progress Notes (Signed)

## 2023-03-10 ENCOUNTER — Other Ambulatory Visit (HOSPITAL_COMMUNITY): Payer: Self-pay

## 2023-03-20 ENCOUNTER — Other Ambulatory Visit (HOSPITAL_COMMUNITY): Payer: Self-pay

## 2023-03-25 ENCOUNTER — Other Ambulatory Visit (HOSPITAL_COMMUNITY): Payer: Self-pay

## 2023-07-28 ENCOUNTER — Other Ambulatory Visit: Payer: Self-pay | Admitting: Family Medicine

## 2023-07-28 DIAGNOSIS — Z1231 Encounter for screening mammogram for malignant neoplasm of breast: Secondary | ICD-10-CM

## 2023-07-30 ENCOUNTER — Ambulatory Visit
Admission: RE | Admit: 2023-07-30 | Discharge: 2023-07-30 | Disposition: A | Discharge status: Home / Self Care | Source: Ambulatory Visit

## 2023-07-30 DIAGNOSIS — Z1231 Encounter for screening mammogram for malignant neoplasm of breast: Secondary | ICD-10-CM

## 2023-07-31 ENCOUNTER — Other Ambulatory Visit: Payer: Self-pay | Admitting: Family Medicine

## 2023-07-31 DIAGNOSIS — N644 Mastodynia: Secondary | ICD-10-CM

## 2023-08-22 ENCOUNTER — Other Ambulatory Visit (HOSPITAL_COMMUNITY): Payer: Self-pay

## 2023-08-22 ENCOUNTER — Telehealth: Admitting: Nurse Practitioner

## 2023-08-22 ENCOUNTER — Other Ambulatory Visit: Payer: Self-pay | Admitting: Family Medicine

## 2023-08-22 DIAGNOSIS — R399 Unspecified symptoms and signs involving the genitourinary system: Secondary | ICD-10-CM | POA: Diagnosis not present

## 2023-08-22 MED ORDER — NITROFURANTOIN MONOHYD MACRO 100 MG PO CAPS: 100.0000 mg | ORAL_CAPSULE | Freq: Two times a day (BID) | ORAL | 0 refills | Status: AC

## 2023-08-22 NOTE — Progress Notes (Signed)
 I have spent 5 minutes in review of e-visit questionnaire, review and updating patient chart, medical decision making and response to patient.   Claiborne Rigg, NP

## 2023-08-22 NOTE — Progress Notes (Signed)

## 2023-08-25 ENCOUNTER — Other Ambulatory Visit (HOSPITAL_COMMUNITY): Payer: Self-pay

## 2023-08-25 ENCOUNTER — Other Ambulatory Visit: Payer: Self-pay

## 2023-08-26 ENCOUNTER — Ambulatory Visit
Admission: RE | Admit: 2023-08-26 | Discharge: 2023-08-26 | Disposition: A | Discharge status: Home / Self Care | Source: Ambulatory Visit | Attending: Family Medicine | Admitting: Family Medicine

## 2023-08-26 ENCOUNTER — Other Ambulatory Visit (HOSPITAL_COMMUNITY): Payer: Self-pay

## 2023-08-26 ENCOUNTER — Telehealth: Payer: Self-pay

## 2023-08-26 DIAGNOSIS — N644 Mastodynia: Secondary | ICD-10-CM

## 2023-08-26 MED ORDER — FREESTYLE LIBRE 2 SENSOR MISC
1 refills | Status: DC
  Filled 2023-08-26: qty 2, 28d supply, fill #0

## 2023-08-26 MED ORDER — PHENTERMINE HCL 37.5 MG PO CAPS
37.5000 mg | ORAL_CAPSULE | Freq: Every day | ORAL | 5 refills | Status: DC
  Filled 2023-08-26: qty 30, 30d supply, fill #0
  Filled 2023-10-09 – 2024-02-17 (×5): qty 30, 30d supply, fill #1

## 2023-08-26 MED ORDER — CYCLOBENZAPRINE HCL 10 MG PO TABS
10.0000 mg | ORAL_TABLET | Freq: Three times a day (TID) | ORAL | 5 refills | Status: AC | PRN
  Filled 2023-08-26: qty 60, 20d supply, fill #0
  Filled 2023-10-09 – 2024-02-17 (×3): qty 60, 20d supply, fill #1

## 2023-08-26 NOTE — Telephone Encounter (Signed)
 Pharmacy Patient Advocate Encounter   Received notification from Patient Pharmacy that prior authorization for Phentermine  37.5 caps is required/requested.   Insurance verification completed.   The patient is insured through [facility] AD[employer]/RX [employer] .   Per test claim: PA required; PA submitted to above mentioned insurance via CoverMyMeds Key/confirmation #/EOC [id number] Status is pending

## 2023-08-27 ENCOUNTER — Other Ambulatory Visit (HOSPITAL_COMMUNITY): Payer: Self-pay

## 2023-08-27 NOTE — Telephone Encounter (Signed)
 Pharmacy Patient Advocate Encounter  Received notification from [employer] that Prior Authorization for Phentermine  has been DENIED.  Full denial letter will be uploaded to the media tab. See denial reason below.   PA #/Case ID/Reference #: [id number]

## 2023-08-31 NOTE — Telephone Encounter (Signed)
 Please appeal the PA for Phentermine 

## 2023-09-01 ENCOUNTER — Other Ambulatory Visit (HOSPITAL_COMMUNITY): Payer: Self-pay

## 2023-09-02 ENCOUNTER — Other Ambulatory Visit (HOSPITAL_COMMUNITY): Payer: Self-pay

## 2023-09-02 ENCOUNTER — Telehealth: Payer: Self-pay | Admitting: Pharmacist

## 2023-09-02 NOTE — Telephone Encounter (Signed)
 Appeal has been submitted for phentermine . Will advise when response is received, please be advised that most companies may take 30 days to make a decision. Appeal letter and supporting documentation have been faxed to [phone] on 09/02/2023 @10 :21 am.  Thank you, Dene Fines, PharmD Clinical Pharmacist  [place]  Direct Dial: [phone]

## 2023-09-04 ENCOUNTER — Other Ambulatory Visit: Payer: Self-pay

## 2023-09-04 ENCOUNTER — Other Ambulatory Visit (HOSPITAL_COMMUNITY): Payer: Self-pay

## 2023-09-09 ENCOUNTER — Other Ambulatory Visit: Payer: Self-pay

## 2023-09-09 ENCOUNTER — Other Ambulatory Visit (HOSPITAL_COMMUNITY): Payer: Self-pay

## 2023-09-15 ENCOUNTER — Other Ambulatory Visit (HOSPITAL_COMMUNITY): Payer: Self-pay

## 2023-09-16 ENCOUNTER — Telehealth: Admitting: Physician Assistant

## 2023-09-16 DIAGNOSIS — H6992 Unspecified Eustachian tube disorder, left ear: Secondary | ICD-10-CM

## 2023-09-16 DIAGNOSIS — H66002 Acute suppurative otitis media without spontaneous rupture of ear drum, left ear: Secondary | ICD-10-CM | POA: Diagnosis not present

## 2023-09-16 MED ORDER — AMOXICILLIN 875 MG PO TABS: 875.0000 mg | ORAL_TABLET | Freq: Two times a day (BID) | ORAL | 0 refills | Status: DC

## 2023-09-16 MED ORDER — IPRATROPIUM BROMIDE 0.03 % NA SOLN: 2.0000 | Freq: Two times a day (BID) | NASAL | 0 refills | Status: DC

## 2023-09-16 NOTE — Progress Notes (Signed)

## 2023-09-16 NOTE — Addendum Note (Signed)
 Addended by: Farris Hong on: 09/16/2023 04:58 PM   Modules accepted: Orders

## 2023-09-16 NOTE — Progress Notes (Signed)
 I have spent 5 minutes in review of e-visit questionnaire, review and updating patient chart, medical decision making and response to patient.   Piedad Climes, PA-C

## 2023-09-18 ENCOUNTER — Ambulatory Visit (HOSPITAL_COMMUNITY)
Admission: EM | Admit: 2023-09-18 | Discharge: 2023-09-18 | Disposition: A | Discharge status: Home / Self Care | Attending: Emergency Medicine | Admitting: Emergency Medicine

## 2023-09-18 ENCOUNTER — Encounter: Payer: Self-pay | Admitting: Family Medicine

## 2023-09-18 ENCOUNTER — Encounter (HOSPITAL_COMMUNITY): Payer: Self-pay

## 2023-09-18 DIAGNOSIS — H60392 Other infective otitis externa, left ear: Secondary | ICD-10-CM

## 2023-09-18 DIAGNOSIS — H66002 Acute suppurative otitis media without spontaneous rupture of ear drum, left ear: Secondary | ICD-10-CM

## 2023-09-18 MED ORDER — AMOXICILLIN-POT CLAVULANATE 875-125 MG PO TABS: 1.0000 | ORAL_TABLET | Freq: Two times a day (BID) | ORAL | 0 refills | Status: DC

## 2023-09-18 MED ORDER — OFLOXACIN 0.3 % OT SOLN
5.0000 [drp] | Freq: Two times a day (BID) | OTIC | 0 refills | Status: AC
Start: 2023-09-18 — End: 2023-09-25

## 2023-09-18 NOTE — ED Triage Notes (Signed)
 Pt reports she has left ear pain x 2 weeks

## 2023-09-18 NOTE — Discharge Instructions (Addendum)
 Use the Floxin eardrops twice daily to the left ear for the next 7 days.  Stop the amoxicillin  and start taking the Augmentin  every 12 hours with food.  You can alternate between and her milligrams of ibuprofen  and 500 mg of Tylenol  every 4-6 hours for fever or ear pain.  Symptoms should improve over the next few days with treatment.  If no improvement by Monday please follow-up with your primary care provider.

## 2023-09-18 NOTE — Telephone Encounter (Signed)
 Pt has appointment scheduled for 09/22/23 for this problem

## 2023-09-18 NOTE — ED Provider Notes (Signed)
 [facility]    CSN: [id number] Arrival date & time: 09/18/23  1806      History   Chief Complaint No chief complaint on file.   HPI Lisa Horne is a 48 y.o. female.   Patient presents to clinic over concern of left ear pain.  This has been ongoing for the past 2 weeks but got worse over the past few days.  She recently did a telemedicine visit where she was started on amoxicillin  and a nasal spray.  Has been taking this as prescribed and pain has gotten worse.  This morning she woke up and she had some purulent drainage coming out of her left ear.  Has not had fevers.  Has been taking ibuprofen  for pain.  Feels like a muffled sensation in the left ear and diminished hearing.  The history is provided by the patient and medical records.    Past Medical History:  Diagnosis Date   Allergy    Anemia    GERD (gastroesophageal reflux disease)    Hyperlipemia    PCOS (polycystic ovarian syndrome)    PONV (postoperative nausea and vomiting)    Prediabetes    Thyroid  disease    hypothyroidism    Patient Active Problem List   Diagnosis Date Noted   Ganglion cyst of volar aspect of right wrist 12/16/2022   Aortic atherosclerosis (HCC) 05/19/2022   Prediabetes 06/27/2021   B12 deficiency 06/06/2020   Iron  deficiency anemia 06/06/2020   GERD (gastroesophageal reflux disease) 04/24/2014   Fever blister 04/24/2014   Obesity 12/10/2011   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 Willy Harvest, diverticulosis, no polyps, repeat in 10 yrs   CYST EXCISION Right 11/28/2022   Procedure: RIGHT WRIST VOLAR GANGLION CYST EXCISION;  Surgeon: Merrill Abide, MD;  Location: [facility];  Service: Orthopedics;  Laterality: Right;   GASTRIC BANDING PORT REVISION     GASTRIC RESTRICTION SURGERY     lap band 2005   LAPAROSCOPIC GASTRIC SLEEVE RESECTION      OB History     Gravida  2   Para  2   Term  2   Preterm      AB      Living  2      SAB      IAB      Ectopic      Multiple      Live Births  2            Home Medications    Prior to Admission medications   Medication Sig Start Date End Date Taking? Authorizing Provider  amoxicillin -clavulanate (AUGMENTIN ) 875-125 MG tablet Take 1 tablet by mouth every 12 (twelve) hours. 09/18/23  Yes Demone Lyles  N, FNP  ofloxacin (FLOXIN) 0.3 % OTIC solution Place 5 drops into the left ear 2 (two) times daily for 7 days. 09/18/23 09/25/23 Yes Berthe Oley  N, FNP  atorvastatin  (LIPITOR) 10 MG tablet Take 1 tablet (10 mg total) by mouth daily. 02/17/23   Donley Furth, MD  celecoxib  (CELEBREX ) 200 MG capsule Take 1 capsule (200 mg total) by mouth 2 (two) times daily. 02/17/23   Donley Furth, MD  Chlorpheniramine Maleate (  ALLERGY PO) Take by mouth. BUYS ALLERGY MED ON SALE - VARIES BETWEEN CLARITIN, ZYRTEC, ALLEGRA    [provider]  Coenzyme Q10 (COQ10) 100 MG CAPS Take 100 mg by mouth daily at 12 noon.    [provider]  Continuous Glucose Sensor (FREESTYLE LIBRE 2 SENSOR) MISC Apply sensor as directed every 14 days for continuous blood glucose monitoring. 08/26/23   Donley Furth, MD  cyanocobalamin  (DODEX ) 1000 MCG/ML injection Inject 1 mL (1,000 mcg total) into the muscle once a week. 02/17/23 02/17/24  Donley Furth, MD  cyclobenzaprine  (FLEXERIL ) 10 MG tablet Take 1 tablet (10 mg total) by mouth 3 (three) times daily as needed for muscle spasms. 08/26/23   Donley Furth, MD  famotidine  (PEPCID ) 40 MG tablet Take 1 tablet (40 mg total) by mouth 2 (two) times daily. 02/17/23   Donley Furth, MD  ferrous sulfate  325 (65 FE) MG EC tablet TAKE 1 TABLET BY MOUTH 2  TIMES DAILY 02/17/23 02/17/24  Donley Furth, MD  ipratropium (ATROVENT) 0.03 % nasal spray Place 2 sprays into both nostrils every 12 (twelve) hours. 09/16/23   Farris Hong, PA-C  levothyroxine  (SYNTHROID ) 200 MCG tablet Take 1 tablet (200 mcg total) by mouth daily. 02/20/23   Donley Furth, MD  Multiple Vitamins-Minerals (BARIATRIC MULTIVITAMINS/IRON ) CAPS Take 1 capsule by mouth daily. 02/25/19   Donley Furth, MD  Omega-3 Fatty Acids (FISH OIL) 1200 MG CAPS Take 1,200 mg by mouth daily at 12 noon.    [provider]  phentermine  37.5 MG capsule Take 1 capsule (37.5 mg total) by mouth daily. 08/26/23   Donley Furth, MD  Semaglutide -Weight Management (WEGOVY ) 2.4 MG/0.75ML SOAJ Inject 2.4 mg into the skin once a week. 02/17/23   Donley Furth, MD  valACYclovir  (VALTREX ) 500 MG tablet Take 1 tablet (500 mg total) by mouth 2 (two) times daily. 02/17/23   Donley Furth, MD    Family History Family History  Problem Relation Age of Onset   Breast cancer Mother    Hypertension Mother    Cancer Mother        breast   Depression Mother    Diabetes Father    Cancer Father        skin   Colon polyps Sister    Colon cancer Sister    Depression Sister    Colon cancer Maternal Uncle    Esophageal cancer Neg Hx    Rectal cancer Neg Hx    Stomach cancer Neg Hx     Social History Social History   Tobacco Use   Smoking status: Never   Smokeless tobacco: Never  Vaping Use   Vaping status: Never Used  Substance Use Topics   Alcohol use: No    Alcohol/week: 0.0 standard drinks of alcohol   Drug use: No     Allergies   Ciprofloxacin and Hydrochlorothiazide-triamterene   Review of Systems Review of Systems  Per HPI  Physical Exam Triage Vital Signs ED Triage Vitals [09/18/23 1858]  Encounter Vitals Group     BP 125/61     Systolic BP Percentile      Diastolic BP Percentile      Pulse Rate 76     Resp 18     Temp 98.6 F (37 C)     Temp Source Oral      SpO2 97 %     Weight      Height  Head Circumference      Peak Flow      Pain Score 6     Pain Loc      Pain Education      Exclude from Growth Chart    No data found.  Updated Vital Signs BP 125/61 (BP Location: Left Arm)   Pulse 76   Temp 98.6 F (37 C) (Oral)   Resp 18   SpO2 97%   Visual Acuity Right Eye Distance:   Left Eye Distance:   Bilateral Distance:    Right Eye Near:   Left Eye Near:    Bilateral Near:     Physical Exam Vitals and nursing note reviewed.  Constitutional:      Appearance: Normal appearance.  HENT:     Head: Normocephalic.     Right Ear: Tympanic membrane, ear canal and external ear normal.     Left Ear: Decreased hearing noted. Drainage, swelling and tenderness present. Tympanic membrane is erythematous and bulging.     Nose: Nose normal.     Mouth/Throat:     Mouth: Mucous membranes are moist.  Eyes:     Conjunctiva/sclera: Conjunctivae normal.  Cardiovascular:     Rate and Rhythm: Normal rate.  Pulmonary:     Effort: Pulmonary effort is normal. No respiratory distress.  Skin:    General: Skin is warm and dry.  Neurological:     General: No focal deficit present.     Mental Status: She is alert.  Psychiatric:        Mood and Affect: Mood normal.      UC Treatments / Results  Labs (all labs ordered are listed, but only abnormal results are displayed) Labs Reviewed - No data to display  EKG   Radiology No results found.  Procedures Procedures (including critical care time)  Medications Ordered in UC Medications - No data to display  Initial Impression / Assessment and Plan / UC Course  I have reviewed the triage vital signs and the nursing notes.  Pertinent labs & imaging results that were available during my care of the patient were reviewed by me and considered in my medical decision making (see chart for details).  Vitals in triage reviewed, patient is hemodynamically stable.  Left tympanic membrane is  bulging with purulent fluid behind it.  External auditory canal is erythematous with a purulent drainage.  Will change from amoxicillin  to Augmentin  for better coverage and placed on ofloxacin eardrops for otitis externa.  Pain management discussed.  Plan of care, follow-up care return precautions given, no questions at this time.     Final Clinical Impressions(s) / UC Diagnoses   Final diagnoses:  Acute suppurative otitis media of left ear without spontaneous rupture of tympanic membrane, recurrence not specified  Infective otitis externa of left ear     Discharge Instructions      Use the Floxin eardrops twice daily to the left ear for the next 7 days.  Stop the amoxicillin  and start taking the Augmentin  every 12 hours with food.  You can alternate between and her milligrams of ibuprofen  and 500 mg of Tylenol  every 4-6 hours for fever or ear pain.  Symptoms should improve over the next few days with treatment.  If no improvement by Monday please follow-up with your primary care provider.  ED Prescriptions     Medication Sig Dispense Auth. Provider   amoxicillin -clavulanate (AUGMENTIN ) 875-125 MG tablet Take 1 tablet by mouth every 12 (twelve) hours. 14 tablet Harlow Lighter,  Nial Hawe  N, FNP   ofloxacin (FLOXIN) 0.3 % OTIC solution Place 5 drops into the left ear 2 (two) times daily for 7 days. 5 mL Harlow Lighter, Lakeyn Dokken  N, FNP      PDMP not reviewed this encounter.   Fred Jacobsen, FNP 09/18/23 1931

## 2023-09-21 NOTE — Telephone Encounter (Signed)
 I see she was seen at urgent care on 09-18-23 for this

## 2023-09-22 ENCOUNTER — Ambulatory Visit: Admitting: Family Medicine

## 2023-09-23 ENCOUNTER — Ambulatory Visit: Admitting: Family Medicine

## 2023-09-24 ENCOUNTER — Other Ambulatory Visit (HOSPITAL_COMMUNITY): Payer: Self-pay

## 2023-09-28 ENCOUNTER — Other Ambulatory Visit (HOSPITAL_COMMUNITY): Payer: Self-pay

## 2023-10-02 NOTE — Telephone Encounter (Signed)
 Spoke with insurance and the appeal for Phentermine  was denied, a letter was mailed to the patient.

## 2023-10-05 NOTE — Telephone Encounter (Signed)
 FYI

## 2023-10-09 ENCOUNTER — Other Ambulatory Visit (HOSPITAL_COMMUNITY): Payer: Self-pay

## 2023-10-10 ENCOUNTER — Other Ambulatory Visit (HOSPITAL_COMMUNITY): Payer: Self-pay

## 2023-10-12 ENCOUNTER — Other Ambulatory Visit: Payer: Self-pay

## 2023-10-20 ENCOUNTER — Other Ambulatory Visit (HOSPITAL_COMMUNITY): Payer: Self-pay

## 2023-10-24 ENCOUNTER — Other Ambulatory Visit (HOSPITAL_COMMUNITY): Payer: Self-pay

## 2023-10-26 ENCOUNTER — Other Ambulatory Visit: Payer: Self-pay

## 2023-11-03 ENCOUNTER — Other Ambulatory Visit (HOSPITAL_COMMUNITY): Payer: Self-pay

## 2023-11-24 ENCOUNTER — Encounter: Payer: Self-pay | Admitting: Family Medicine

## 2023-11-24 ENCOUNTER — Ambulatory Visit: Admitting: Family Medicine

## 2023-11-24 VITALS — BP 110/78 | HR 72 | Temp 97.9°F | Wt 241.0 lb

## 2023-11-24 DIAGNOSIS — F418 Other specified anxiety disorders: Secondary | ICD-10-CM | POA: Insufficient documentation

## 2023-11-24 DIAGNOSIS — R7303 Prediabetes: Secondary | ICD-10-CM | POA: Diagnosis not present

## 2023-11-24 DIAGNOSIS — E039 Hypothyroidism, unspecified: Secondary | ICD-10-CM

## 2023-11-24 DIAGNOSIS — I10 Essential (primary) hypertension: Secondary | ICD-10-CM

## 2023-11-24 DIAGNOSIS — E559 Vitamin D deficiency, unspecified: Secondary | ICD-10-CM | POA: Diagnosis not present

## 2023-11-24 DIAGNOSIS — D509 Iron deficiency anemia, unspecified: Secondary | ICD-10-CM | POA: Diagnosis not present

## 2023-11-24 DIAGNOSIS — E538 Deficiency of other specified B group vitamins: Secondary | ICD-10-CM

## 2023-11-24 LAB — CBC WITH DIFFERENTIAL/PLATELET
Basophils Absolute: 0.1 K/uL (ref 0.0–0.1)
Basophils Relative: 1.2 % (ref 0.0–3.0)
Eosinophils Absolute: 0.1 K/uL (ref 0.0–0.7)
Eosinophils Relative: 2.7 % (ref 0.0–5.0)
HCT: 40.1 % (ref 36.0–46.0)
Hemoglobin: 13 g/dL (ref 12.0–15.0)
Lymphocytes Relative: 21.8 % (ref 12.0–46.0)
Lymphs Abs: 1.2 K/uL (ref 0.7–4.0)
MCHC: 32.4 g/dL (ref 30.0–36.0)
MCV: 79.5 fl (ref 78.0–100.0)
Monocytes Absolute: 0.6 K/uL (ref 0.1–1.0)
Monocytes Relative: 10.9 % (ref 3.0–12.0)
Neutro Abs: 3.4 K/uL (ref 1.4–7.7)
Neutrophils Relative %: 63.4 % (ref 43.0–77.0)
Platelets: 287 K/uL (ref 150.0–400.0)
RBC: 5.04 Mil/uL (ref 3.87–5.11)
RDW: 15.1 % (ref 11.5–15.5)
WBC: 5.4 K/uL (ref 4.0–10.5)

## 2023-11-24 LAB — BASIC METABOLIC PANEL WITH GFR
BUN: 19 mg/dL (ref 6–23)
CO2: 31 meq/L (ref 19–32)
Calcium: 9.6 mg/dL (ref 8.4–10.5)
Chloride: 102 meq/L (ref 96–112)
Creatinine, Ser: 0.79 mg/dL (ref 0.40–1.20)
GFR: 87.13 mL/min (ref >60.00)
Glucose, Bld: 85 mg/dL (ref 70–99)
Potassium: 4.6 meq/L (ref 3.5–5.1)
Sodium: 139 meq/L (ref 135–145)

## 2023-11-24 LAB — HEPATIC FUNCTION PANEL
ALT: 12 U/L (ref 0–35)
AST: 14 U/L (ref 0–37)
Albumin: 4.2 g/dL (ref 3.5–5.2)
Alkaline Phosphatase: 101 U/L (ref 39–117)
Bilirubin, Direct: 0.2 mg/dL (ref 0.0–0.3)
Total Bilirubin: 1 mg/dL (ref 0.2–1.2)
Total Protein: 7.3 g/dL (ref 6.0–8.3)

## 2023-11-24 LAB — T4, FREE: Free T4: 0.81 ng/dL (ref 0.60–1.60)

## 2023-11-24 LAB — LIPID PANEL
Cholesterol: 253 mg/dL — ABNORMAL HIGH (ref 0–200)
HDL: 49.6 mg/dL (ref >39.00)
LDL Cholesterol: 183 mg/dL — ABNORMAL HIGH (ref 0–99)
NonHDL: 203.05
Total CHOL/HDL Ratio: 5
Triglycerides: 101 mg/dL (ref 0.0–149.0)
VLDL: 20.2 mg/dL (ref 0.0–40.0)

## 2023-11-24 LAB — TSH: TSH: 7.14 u[IU]/mL — ABNORMAL HIGH (ref 0.35–5.50)

## 2023-11-24 LAB — VITAMIN D 25 HYDROXY (VIT D DEFICIENCY, FRACTURES): VITD: 17.71 ng/mL — ABNORMAL LOW (ref 30.00–100.00)

## 2023-11-24 LAB — VITAMIN B12: Vitamin B-12: 462 pg/mL (ref 211–911)

## 2023-11-24 LAB — IBC + FERRITIN
Ferritin: 10.3 ng/mL (ref 10.0–291.0)
Iron: 165 ug/dL — ABNORMAL HIGH (ref 42–145)
Saturation Ratios: 34.4 % (ref 20.0–50.0)
TIBC: 480.2 ug/dL — ABNORMAL HIGH (ref 250.0–450.0)
Transferrin: 343 mg/dL (ref 212.0–360.0)

## 2023-11-24 LAB — T3, FREE: T3, Free: 3.1 pg/mL (ref 2.3–4.2)

## 2023-11-24 LAB — FOLATE: Folate: 15.1 ng/mL (ref >5.9)

## 2023-11-24 LAB — HEMOGLOBIN A1C: Hgb A1c MFr Bld: 5.9 % (ref 4.6–6.5)

## 2023-11-24 MED ORDER — FLUOXETINE HCL 20 MG PO TABS: 20.0000 mg | ORAL_TABLET | Freq: Every day | ORAL | 3 refills | Status: DC

## 2023-11-24 NOTE — Progress Notes (Signed)
   Subjective:    Patient ID: Lisa Horne, female    DOB: [birth year], 48 y.o.   MRN: [id number]  HPI Here to get some labs drawn, but she also wants to discuss her moods. Over the past 3 months she has become very irritable and she loses he temper quickly with her family. She feels stressed and she worries about things. She also feels sad and hopeless at times. She has trouble sleeping. Her appetite has not changed. She thinks she may be heading into menopause.    Review of Systems  Constitutional: Negative.   Respiratory: Negative.    Cardiovascular: Negative.   Psychiatric/Behavioral:  Positive for agitation, decreased concentration, dysphoric mood and sleep disturbance. Negative for behavioral problems, confusion, hallucinations, self-injury and suicidal ideas. The patient is nervous/anxious.        Objective:   Physical Exam Constitutional:      Appearance: Normal appearance.  Cardiovascular:     Rate and Rhythm: Normal rate and regular rhythm.     Pulses: Normal pulses.     Heart sounds: Normal heart sounds.  Pulmonary:     Effort: Pulmonary effort is normal.     Breath sounds: Normal breath sounds.  Neurological:     Mental Status: She is alert and oriented to person, place, and time.  Psychiatric:        Mood and Affect: Mood normal.        Behavior: Behavior normal.        Thought Content: Thought content normal.           Assessment & Plan:  She is exhibiting some anxiety and some depression. She will try Prozac  20 mg daily. She also plans to find a therapist to work with. She will follow up here in 3-4 weeks. She will also have the labs drawn. We spent a total of (32   ) minutes reviewing records and discussing these issues.  Garnette Olmsted, MD  Garnette Olmsted, MD

## 2023-11-25 ENCOUNTER — Ambulatory Visit: Payer: Self-pay | Admitting: Family Medicine

## 2023-12-01 ENCOUNTER — Other Ambulatory Visit: Payer: Self-pay | Admitting: Family Medicine

## 2023-12-01 ENCOUNTER — Encounter: Payer: Self-pay | Admitting: Family Medicine

## 2023-12-01 MED ORDER — FREESTYLE LIBRE 3 SENSOR MISC
1.0000 | 3 refills | Status: DC
Start: 2023-12-01 — End: 2023-12-02

## 2023-12-01 NOTE — Telephone Encounter (Signed)
 Done

## 2023-12-01 NOTE — Telephone Encounter (Signed)
 Please call her pharmacy to order [facility] 3 Plus sensors

## 2023-12-02 NOTE — Telephone Encounter (Signed)
 I do not understand what they want. We sent a RX for a Libre Plus sensor but they say they are on back order. What can they give her?

## 2023-12-09 ENCOUNTER — Other Ambulatory Visit: Payer: Self-pay

## 2023-12-09 ENCOUNTER — Other Ambulatory Visit (HOSPITAL_COMMUNITY): Payer: Self-pay

## 2023-12-09 ENCOUNTER — Encounter: Payer: Self-pay | Admitting: Pharmacist

## 2023-12-14 ENCOUNTER — Other Ambulatory Visit: Payer: Self-pay

## 2024-01-14 ENCOUNTER — Other Ambulatory Visit (HOSPITAL_COMMUNITY): Payer: Self-pay

## 2024-01-28 ENCOUNTER — Other Ambulatory Visit: Payer: Self-pay

## 2024-02-07 ENCOUNTER — Other Ambulatory Visit (HOSPITAL_COMMUNITY): Payer: Self-pay

## 2024-02-08 ENCOUNTER — Other Ambulatory Visit (HOSPITAL_COMMUNITY): Payer: Self-pay

## 2024-02-17 ENCOUNTER — Other Ambulatory Visit: Payer: Self-pay

## 2024-02-17 ENCOUNTER — Other Ambulatory Visit (HOSPITAL_COMMUNITY): Payer: Self-pay

## 2024-02-18 ENCOUNTER — Ambulatory Visit: Admitting: Family Medicine

## 2024-02-18 ENCOUNTER — Other Ambulatory Visit (HOSPITAL_COMMUNITY): Payer: Self-pay

## 2024-02-18 ENCOUNTER — Encounter: Payer: Self-pay | Admitting: Family Medicine

## 2024-02-18 VITALS — BP 110/68 | HR 68 | Temp 98.2°F | Ht 69.0 in | Wt 241.0 lb

## 2024-02-18 DIAGNOSIS — Z23 Encounter for immunization: Secondary | ICD-10-CM

## 2024-02-18 DIAGNOSIS — Z0000 Encounter for general adult medical examination without abnormal findings: Secondary | ICD-10-CM | POA: Diagnosis not present

## 2024-02-18 LAB — IBC + FERRITIN
Ferritin: 10.9 ng/mL (ref 10.0–291.0)
Iron: 60 ug/dL (ref 42–145)
Saturation Ratios: 12 % — ABNORMAL LOW (ref 20.0–50.0)
TIBC: 499.8 ug/dL — ABNORMAL HIGH (ref 250.0–450.0)
Transferrin: 357 mg/dL (ref 212.0–360.0)

## 2024-02-18 LAB — CBC WITH DIFFERENTIAL/PLATELET
Basophils Absolute: 0.1 K/uL (ref 0.0–0.1)
Basophils Relative: 1 % (ref 0.0–3.0)
Eosinophils Absolute: 0.1 K/uL (ref 0.0–0.7)
Eosinophils Relative: 1.9 % (ref 0.0–5.0)
HCT: 39.9 % (ref 36.0–46.0)
Hemoglobin: 13.1 g/dL (ref 12.0–15.0)
Lymphocytes Relative: 25.1 % (ref 12.0–46.0)
Lymphs Abs: 1.4 K/uL (ref 0.7–4.0)
MCHC: 32.8 g/dL (ref 30.0–36.0)
MCV: 82.2 fl (ref 78.0–100.0)
Monocytes Absolute: 0.6 K/uL (ref 0.1–1.0)
Monocytes Relative: 10.2 % (ref 3.0–12.0)
Neutro Abs: 3.5 K/uL (ref 1.4–7.7)
Neutrophils Relative %: 61.8 % (ref 43.0–77.0)
Platelets: 271 K/uL (ref 150.0–400.0)
RBC: 4.86 Mil/uL (ref 3.87–5.11)
RDW: 15.3 % (ref 11.5–15.5)
WBC: 5.7 K/uL (ref 4.0–10.5)

## 2024-02-18 LAB — BASIC METABOLIC PANEL WITH GFR
BUN: 15 mg/dL (ref 6–23)
CO2: 31 meq/L (ref 19–32)
Calcium: 9.8 mg/dL (ref 8.4–10.5)
Chloride: 102 meq/L (ref 96–112)
Creatinine, Ser: 0.77 mg/dL (ref 0.40–1.20)
GFR: 89.71 mL/min (ref >60.00)
Glucose, Bld: 87 mg/dL (ref 70–99)
Potassium: 4.3 meq/L (ref 3.5–5.1)
Sodium: 141 meq/L (ref 135–145)

## 2024-02-18 LAB — VITAMIN D 25 HYDROXY (VIT D DEFICIENCY, FRACTURES): VITD: 16.56 ng/mL — ABNORMAL LOW (ref 30.00–100.00)

## 2024-02-18 LAB — LIPID PANEL
Cholesterol: 221 mg/dL — ABNORMAL HIGH (ref 0–200)
HDL: 53.9 mg/dL (ref >39.00)
LDL Cholesterol: 151 mg/dL — ABNORMAL HIGH (ref 0–99)
NonHDL: 167.21
Total CHOL/HDL Ratio: 4
Triglycerides: 80 mg/dL (ref 0.0–149.0)
VLDL: 16 mg/dL (ref 0.0–40.0)

## 2024-02-18 LAB — VITAMIN B12: Vitamin B-12: 763 pg/mL (ref 211–911)

## 2024-02-18 LAB — HEPATIC FUNCTION PANEL
ALT: 9 U/L (ref 0–35)
AST: 13 U/L (ref 0–37)
Albumin: 4.5 g/dL (ref 3.5–5.2)
Alkaline Phosphatase: 108 U/L (ref 39–117)
Bilirubin, Direct: 0.1 mg/dL (ref 0.0–0.3)
Total Bilirubin: 0.7 mg/dL (ref 0.2–1.2)
Total Protein: 7.7 g/dL (ref 6.0–8.3)

## 2024-02-18 LAB — T3, FREE: T3, Free: 3.2 pg/mL (ref 2.3–4.2)

## 2024-02-18 LAB — T4, FREE: Free T4: 0.9 ng/dL (ref 0.60–1.60)

## 2024-02-18 LAB — TSH: TSH: 3.07 u[IU]/mL (ref 0.35–5.50)

## 2024-02-18 MED ORDER — FAMOTIDINE 40 MG PO TABS
40.0000 mg | ORAL_TABLET | Freq: Two times a day (BID) | ORAL | 3 refills | Status: AC
  Filled 2024-02-18: qty 180, 90d supply, fill #0

## 2024-02-18 MED ORDER — ATORVASTATIN CALCIUM 10 MG PO TABS
10.0000 mg | ORAL_TABLET | Freq: Every day | ORAL | 3 refills | Status: DC
  Filled 2024-02-18: qty 90, 90d supply, fill #0

## 2024-02-18 MED ORDER — CELECOXIB 200 MG PO CAPS
200.0000 mg | ORAL_CAPSULE | Freq: Two times a day (BID) | ORAL | 3 refills | Status: AC
Start: 2024-02-18 — End: ?
  Filled 2024-02-18 – 2024-03-05 (×4): qty 180, 90d supply, fill #0
  Filled 2024-05-07: qty 180, 90d supply, fill #1

## 2024-02-18 MED ORDER — VALACYCLOVIR HCL 500 MG PO TABS
500.0000 mg | ORAL_TABLET | Freq: Two times a day (BID) | ORAL | 3 refills | Status: AC
  Filled 2024-02-18: qty 180, 90d supply, fill #0
  Filled 2024-05-11: qty 180, 90d supply, fill #1

## 2024-02-18 MED ORDER — FLUOXETINE HCL 20 MG PO TABS
20.0000 mg | ORAL_TABLET | Freq: Every day | ORAL | 3 refills | Status: AC
Start: 2024-02-18 — End: ?
  Filled 2024-02-18: qty 90, 90d supply, fill #0

## 2024-02-18 MED ORDER — WEGOVY 2.4 MG/0.75ML ~~LOC~~ SOAJ
2.4000 mg | SUBCUTANEOUS | 3 refills | Status: AC
  Filled 2024-02-18: qty 3, 28d supply, fill #0

## 2024-02-18 MED ORDER — LEVOTHYROXINE SODIUM 200 MCG PO TABS
200.0000 ug | ORAL_TABLET | Freq: Every day | ORAL | 3 refills | Status: AC
Start: 2024-02-18 — End: ?
  Filled 2024-03-02 – 2024-03-05 (×2): qty 90, 90d supply, fill #0

## 2024-02-18 MED ORDER — FERROUS SULFATE 325 (65 FE) MG PO TBEC
DELAYED_RELEASE_TABLET | ORAL | 3 refills | Status: AC
  Filled 2024-02-18: qty 24, 84d supply, fill #0

## 2024-02-18 NOTE — Addendum Note (Signed)
 Addended by: LADONNA INOCENTE SAILOR on: 02/18/2024 11:37 AM   Modules accepted: Orders

## 2024-02-18 NOTE — Progress Notes (Signed)
   Subjective:    Patient ID: Lisa Horne, female    DOB: 14-Jul-1975, 48 y.o.   MRN: [id number]  HPI Here for a well exam. She is doing well. We started her on Prozac  a few months ago for anxiety and depression , and this has been working well for her.   Review of Systems  Constitutional: Negative.   HENT: Negative.    Eyes: Negative.   Respiratory: Negative.    Cardiovascular: Negative.   Gastrointestinal: Negative.   Genitourinary:  Negative for decreased urine volume, difficulty urinating, dyspareunia, dysuria, enuresis, flank pain, frequency, hematuria, pelvic pain and urgency.  Musculoskeletal: Negative.   Skin: Negative.   Neurological: Negative.  Negative for headaches.  Psychiatric/Behavioral: Negative.         Objective:   Physical Exam Constitutional:      General: She is not in acute distress.    Appearance: She is well-developed. She is obese.  HENT:     Head: Normocephalic and atraumatic.     Right Ear: External ear normal.     Left Ear: External ear normal.     Nose: Nose normal.     Mouth/Throat:     Pharynx: No oropharyngeal exudate.  Eyes:     General: No scleral icterus.    Conjunctiva/sclera: Conjunctivae normal.     Pupils: Pupils are equal, round, and reactive to light.  Neck:     Thyroid : No thyromegaly.     Vascular: No JVD.  Cardiovascular:     Rate and Rhythm: Normal rate and regular rhythm.     Pulses: Normal pulses.     Heart sounds: Normal heart sounds. No murmur heard.    No friction rub. No gallop.  Pulmonary:     Effort: Pulmonary effort is normal. No respiratory distress.     Breath sounds: Normal breath sounds. No wheezing or rales.  Chest:     Chest wall: No tenderness.  Abdominal:     General: Bowel sounds are normal. There is no distension.     Palpations: Abdomen is soft. There is no mass.     Tenderness: There is no abdominal tenderness. There is no guarding or rebound.  Musculoskeletal:        General: No tenderness.  Normal range of motion.     Cervical back: Normal range of motion and neck supple.  Lymphadenopathy:     Cervical: No cervical adenopathy.  Skin:    General: Skin is warm and dry.     Findings: No erythema or rash.  Neurological:     General: No focal deficit present.     Mental Status: She is alert and oriented to person, place, and time.     Cranial Nerves: No cranial nerve deficit.     Motor: No abnormal muscle tone.     Coordination: Coordination normal.     Deep Tendon Reflexes: Reflexes are normal and symmetric. Reflexes normal.  Psychiatric:        Mood and Affect: Mood normal.        Behavior: Behavior normal.        Thought Content: Thought content normal.        Judgment: Judgment normal.           Assessment & Plan:  Well exam. We discussed diet and exercise. Get fasting labs. Garnette Olmsted, MD

## 2024-02-19 ENCOUNTER — Other Ambulatory Visit (HOSPITAL_COMMUNITY): Payer: Self-pay

## 2024-02-19 ENCOUNTER — Ambulatory Visit: Payer: Self-pay | Admitting: Family Medicine

## 2024-02-19 MED ORDER — FREESTYLE LIBRE 3 PLUS SENSOR MISC
11 refills | Status: AC
  Filled 2024-02-19 – 2024-03-02 (×2): qty 2, 28d supply, fill #0
  Filled 2024-03-05: qty 2, 30d supply, fill #0

## 2024-02-19 MED ORDER — ATORVASTATIN CALCIUM 20 MG PO TABS
20.0000 mg | ORAL_TABLET | Freq: Every day | ORAL | 3 refills | Status: AC
  Filled 2024-02-19: qty 90, 90d supply, fill #0

## 2024-02-19 MED ORDER — VITAMIN D (ERGOCALCIFEROL) 1.25 MG (50000 UNIT) PO CAPS
50000.0000 [IU] | ORAL_CAPSULE | ORAL | 3 refills | Status: AC
  Filled 2024-02-19 – 2024-03-05 (×3): qty 12, 84d supply, fill #0

## 2024-02-19 NOTE — Telephone Encounter (Signed)
 Done

## 2024-02-19 NOTE — Addendum Note (Signed)
 Addended by: JOHNNY SENIOR A on: 02/19/2024 08:41 AM   Modules accepted: Orders

## 2024-02-27 ENCOUNTER — Other Ambulatory Visit (HOSPITAL_COMMUNITY): Payer: Self-pay

## 2024-02-28 ENCOUNTER — Other Ambulatory Visit (HOSPITAL_COMMUNITY): Payer: Self-pay

## 2024-02-29 ENCOUNTER — Other Ambulatory Visit (HOSPITAL_COMMUNITY): Payer: Self-pay

## 2024-03-02 ENCOUNTER — Other Ambulatory Visit: Payer: Self-pay

## 2024-03-02 ENCOUNTER — Other Ambulatory Visit: Payer: Self-pay | Admitting: Family Medicine

## 2024-03-02 ENCOUNTER — Other Ambulatory Visit (HOSPITAL_COMMUNITY): Payer: Self-pay

## 2024-03-02 MED ORDER — PHENTERMINE HCL 37.5 MG PO CAPS
37.5000 mg | ORAL_CAPSULE | Freq: Every day | ORAL | 5 refills | Status: AC
  Filled 2024-03-02 – 2024-05-07 (×3): qty 30, 30d supply, fill #0

## 2024-03-03 ENCOUNTER — Encounter: Payer: Self-pay | Admitting: Pharmacist

## 2024-03-03 ENCOUNTER — Other Ambulatory Visit: Payer: Self-pay

## 2024-03-03 ENCOUNTER — Other Ambulatory Visit (HOSPITAL_COMMUNITY): Payer: Self-pay

## 2024-03-05 ENCOUNTER — Other Ambulatory Visit (HOSPITAL_COMMUNITY): Payer: Self-pay

## 2024-03-07 ENCOUNTER — Other Ambulatory Visit: Payer: Self-pay

## 2024-05-07 ENCOUNTER — Other Ambulatory Visit (HOSPITAL_COMMUNITY): Payer: Self-pay

## 2024-05-08 ENCOUNTER — Other Ambulatory Visit: Payer: Self-pay
